# Patient Record
Sex: Male | Born: 1960 | Race: Black or African American | Hispanic: No | Marital: Married
Health system: Southern US, Community
[De-identification: ages and names within clinical notes are randomized; demographics above are authoritative.]

## PROBLEM LIST (undated history)

## (undated) DIAGNOSIS — E119 Type 2 diabetes mellitus without complications: Secondary | ICD-10-CM

## (undated) DIAGNOSIS — I219 Acute myocardial infarction, unspecified: Secondary | ICD-10-CM

## (undated) DIAGNOSIS — J449 Chronic obstructive pulmonary disease, unspecified: Secondary | ICD-10-CM

## (undated) DIAGNOSIS — G4730 Sleep apnea, unspecified: Secondary | ICD-10-CM

## (undated) DIAGNOSIS — R7303 Prediabetes: Secondary | ICD-10-CM

## (undated) DIAGNOSIS — J45909 Unspecified asthma, uncomplicated: Secondary | ICD-10-CM

## (undated) HISTORY — DX: Type 2 diabetes mellitus without complications: E11.9

## (undated) HISTORY — PX: OTHER SURGICAL HISTORY: SHX169

---

## 2016-09-25 ENCOUNTER — Emergency Department (HOSPITAL_COMMUNITY)
Admission: EM | Admit: 2016-09-25 | Discharge: 2016-09-25 | Discharge status: Against Medical Advice | Payer: Medicaid Other | Attending: Emergency Medicine | Admitting: Emergency Medicine

## 2016-09-25 ENCOUNTER — Encounter (HOSPITAL_COMMUNITY): Payer: Self-pay | Admitting: *Deleted

## 2016-09-25 ENCOUNTER — Emergency Department (HOSPITAL_COMMUNITY): Payer: Medicaid Other

## 2016-09-25 DIAGNOSIS — Z5320 Procedure and treatment not carried out because of patient's decision for unspecified reasons: Secondary | ICD-10-CM | POA: Diagnosis not present

## 2016-09-25 DIAGNOSIS — R0602 Shortness of breath: Secondary | ICD-10-CM | POA: Diagnosis present

## 2016-09-25 DIAGNOSIS — J4541 Moderate persistent asthma with (acute) exacerbation: Secondary | ICD-10-CM | POA: Diagnosis not present

## 2016-09-25 DIAGNOSIS — J449 Chronic obstructive pulmonary disease, unspecified: Secondary | ICD-10-CM | POA: Diagnosis not present

## 2016-09-25 HISTORY — DX: Unspecified asthma, uncomplicated: J45.909

## 2016-09-25 MED ORDER — IPRATROPIUM BROMIDE 0.02 % IN SOLN
0.5000 mg | Freq: Once | RESPIRATORY_TRACT | Status: AC
  Administered 2016-09-25: 0.5 mg via RESPIRATORY_TRACT
  Filled 2016-09-25: qty 2.5

## 2016-09-25 MED ORDER — ALBUTEROL SULFATE (2.5 MG/3ML) 0.083% IN NEBU
5.0000 mg | INHALATION_SOLUTION | Freq: Once | RESPIRATORY_TRACT | Status: AC
  Administered 2016-09-25: 5 mg via RESPIRATORY_TRACT
  Filled 2016-09-25: qty 6

## 2016-09-25 MED ORDER — PREDNISONE 20 MG PO TABS
60.0000 mg | ORAL_TABLET | Freq: Once | ORAL | Status: AC
  Administered 2016-09-25: 60 mg via ORAL
  Filled 2016-09-25: qty 3

## 2016-09-25 MED ORDER — ALBUTEROL SULFATE (2.5 MG/3ML) 0.083% IN NEBU
INHALATION_SOLUTION | RESPIRATORY_TRACT | Status: AC
  Filled 2016-09-25: qty 6

## 2016-09-25 MED ORDER — ALBUTEROL SULFATE (2.5 MG/3ML) 0.083% IN NEBU
5.0000 mg | INHALATION_SOLUTION | Freq: Once | RESPIRATORY_TRACT | Status: AC
  Administered 2016-09-25: 5 mg via RESPIRATORY_TRACT

## 2016-09-25 NOTE — ED Provider Notes (Signed)
[facility] DEPT Provider Note   CSN: [id number] Arrival date & time: 09/25/16  7846     History   Chief Complaint Chief Complaint  Patient presents with  . Asthma    HPI Jack Cunningham is a 56 y.o. male.  Jack Cunningham is a 56 y.o. Male with a history of asthma and COPD who presents to the emergency department complaining of worsening shortness of breath and wheezing over the past 2 days. Patient reports he ran out of his albuterol inhaler and Symbicort inhaler about 2 days ago. He reports over the past 2 days he's had worsening wheezing, chest tightness and shortness of breath. He reports he feels like his asthma is exacerbated. He is not a smoker. He reports he works in a [employer] which exacerbates his symptoms. He denies fevers, chest pain, hemoptysis, leg pain, leg swelling, abdominal pain, nausea, vomiting, lightheadedness, dizziness or rashes.   The history is provided by the patient and medical records. No language interpreter was used.  Asthma  Associated symptoms include shortness of breath. Pertinent negatives include no chest pain, no abdominal pain and no headaches.    Past Medical History:  Diagnosis Date  . Asthma     There are no active problems to display for this patient.   History reviewed. No pertinent surgical history.     Home Medications    Prior to Admission medications   Medication Sig Start Date End Date Taking? Authorizing Provider  albuterol (PROVENTIL HFA;VENTOLIN HFA) 108 (90 Base) MCG/ACT inhaler Inhale 2 puffs into the lungs every 6 (six) hours as needed for wheezing or shortness of breath.   Yes [provider]  budesonide-formoterol (SYMBICORT) 160-4.5 MCG/ACT inhaler Inhale 2 puffs into the lungs 2 (two) times daily.   Yes [provider]    Family History No family history on file.  Social History Social History  Substance Use Topics  . Smoking status: Never Smoker  . Smokeless tobacco: Never Used    . Alcohol use No     Allergies   Patient has no known allergies.   Review of Systems Review of Systems  Constitutional: Negative for chills and fever.  HENT: Negative for congestion and sore throat.   Eyes: Negative for visual disturbance.  Respiratory: Positive for cough, chest tightness, shortness of breath and wheezing.   Cardiovascular: Negative for chest pain and palpitations.  Gastrointestinal: Negative for abdominal pain, nausea and vomiting.  Genitourinary: Negative for dysuria.  Musculoskeletal: Negative for back pain.  Skin: Negative for rash.  Neurological: Negative for syncope, light-headedness and headaches.     Physical Exam Updated Vital Signs BP 123/83   Pulse 65   Temp 97.6 F (36.4 C) (Oral)   Resp 18   SpO2 97%   Physical Exam  Constitutional: He appears well-developed and well-nourished. No distress.  Nontoxic appearing.  HENT:  Head: Normocephalic and atraumatic.  Mouth/Throat: Oropharynx is clear and moist.  Eyes: Pupils are equal, round, and reactive to light. Conjunctivae are normal. Right eye exhibits no discharge. Left eye exhibits no discharge.  Neck: Neck supple. No JVD present.  Cardiovascular: Normal rate, regular rhythm, normal heart sounds and intact distal pulses.  Exam reveals no gallop and no friction rub.   No murmur heard. Pulmonary/Chest: Effort normal. No respiratory distress. He has wheezes. He has no rales.  Diminished lung sounds bilaterally with expiratory wheezes noted bilaterally. No increased work of breathing. Speaking in complete sentences. No rales or rhonchi.  Abdominal: Soft. There is  no tenderness.  Musculoskeletal: He exhibits no edema or tenderness.  Lymphadenopathy:    He has no cervical adenopathy.  Neurological: He is alert. Coordination normal.  Skin: Skin is warm and dry. No rash noted. He is not diaphoretic. No erythema. No pallor.  Psychiatric: He has a normal mood and affect. His behavior is normal.   Nursing note and vitals reviewed.    ED Treatments / Results  Labs (all labs ordered are listed, but only abnormal results are displayed) Labs Reviewed - No data to display  EKG  EKG Interpretation None       Radiology Dg Chest 2 View  Result Date: 09/25/2016 CLINICAL DATA:  Cough. EXAM: CHEST  2 VIEW COMPARISON:  No prior . FINDINGS: Mediastinum and hilar structures are normal. Lungs are clear of infiltrates. Solitary nodular opacities noted over the lung bases on PA view only consistent with nipple shadows. No pleural effusion or pneumothorax. Degenerative changes with scoliosis thoracic spine . IMPRESSION: 1.  No active cardiopulmonary disease. 2. Solitary nodular opacities noted over both lung bases on PA view only consistent with nipple shadows. Repeat PA chest x-ray with nipple markers can be obtained for confirmation . Electronically Signed   By: Marcello Moores  Register   On: 09/25/2016 08:30    Procedures Procedures (including critical care time)  Medications Ordered in ED Medications  albuterol (PROVENTIL) (2.5 MG/3ML) 0.083% nebulizer solution (not administered)  albuterol (PROVENTIL) (2.5 MG/3ML) 0.083% nebulizer solution 5 mg (5 mg Nebulization Given 09/25/16 0759)  albuterol (PROVENTIL) (2.5 MG/3ML) 0.083% nebulizer solution 5 mg (5 mg Nebulization Given 09/25/16 1114)  ipratropium (ATROVENT) nebulizer solution 0.5 mg (0.5 mg Nebulization Given 09/25/16 1114)  predniSONE (DELTASONE) tablet 60 mg (60 mg Oral Given 09/25/16 1113)     Initial Impression / Assessment and Plan / ED Course  I have reviewed the triage vital signs and the nursing notes.  Pertinent labs & imaging results that were available during my care of the patient were reviewed by me and considered in my medical decision making (see chart for details).    This  is a 56 y.o. Male with a history of asthma and COPD who presents to the emergency department complaining of worsening shortness of breath and  wheezing over the past 2 days. Patient reports he ran out of his albuterol inhaler and Symbicort inhaler about 2 days ago. He reports over the past 2 days he's had worsening wheezing, chest tightness and shortness of breath. He reports he feels like his asthma is exacerbated. He is not a smoker. He reports he works in a Chi[employer] which exacerbates his symptoms. On exam the patient is afebrile and non-toxic appearing. He has diminished lung sounds bilateral bases with expiratory wheezing noted bilaterally. He reports feeling somewhat better after initial breathing treatment in triage. Chest x-ray was obtained in triage which has no active cardiopulmonary disease. It does show possible nipple shadows. Recommend repeat x-ray to confirm at some point. I discussed the plan of the patient. Plan for additional breathing treatment and prednisone. Will reevaluate after breathing treatment. I went back to reevaluate the patient and the patient was on the room. I suspect the patient may have been in the bathroom. Will recheck again shortly. I went back to reevaluate the patient again and he is non-in the room. Patient's gown is on the bed. He is not able to be found. Nurse reports that he may have left and she last had vitals on him about an hour ago.  I called the patient and he did not answer the phone. Voicemail box was not set up. Patient left prior to discharge or reevaluation.    Final Clinical Impressions(s) / ED Diagnoses   Final diagnoses:  Moderate persistent asthma with exacerbation    New Prescriptions New Prescriptions   No medications on file     Waynetta Pean, [facility]hal [facility] 09/25/16 1337    Pattricia Boss, MD 09/25/16 [phone]

## 2016-09-25 NOTE — ED Triage Notes (Signed)
Pt reports running out of his inhaler yesterday, c/o SOB, denies pain, pt speaks in full sentences, NAD, A&O x4

## 2016-09-25 NOTE — ED Notes (Signed)
Pt not in room, gown on bed.   

## 2017-06-14 ENCOUNTER — Emergency Department (HOSPITAL_COMMUNITY)
Admission: EM | Admit: 2017-06-14 | Discharge: 2017-06-14 | Disposition: A | Discharge status: Home / Self Care | Payer: Medicaid Other | Attending: Emergency Medicine | Admitting: Emergency Medicine

## 2017-06-14 ENCOUNTER — Other Ambulatory Visit: Payer: Self-pay

## 2017-06-14 ENCOUNTER — Emergency Department (HOSPITAL_COMMUNITY): Payer: Medicaid Other

## 2017-06-14 ENCOUNTER — Encounter (HOSPITAL_COMMUNITY): Payer: Self-pay | Admitting: Nurse Practitioner

## 2017-06-14 DIAGNOSIS — J441 Chronic obstructive pulmonary disease with (acute) exacerbation: Secondary | ICD-10-CM | POA: Diagnosis not present

## 2017-06-14 DIAGNOSIS — R0602 Shortness of breath: Secondary | ICD-10-CM | POA: Diagnosis present

## 2017-06-14 DIAGNOSIS — J45909 Unspecified asthma, uncomplicated: Secondary | ICD-10-CM | POA: Insufficient documentation

## 2017-06-14 HISTORY — DX: Chronic obstructive pulmonary disease, unspecified: J44.9

## 2017-06-14 LAB — BASIC METABOLIC PANEL
Anion gap: 9 (ref 5–15)
BUN: 16 mg/dL (ref 6–20)
CALCIUM: 9.5 mg/dL (ref 8.9–10.3)
CHLORIDE: 107 mmol/L (ref 101–111)
CO2: 27 mmol/L (ref 22–32)
CREATININE: 1.04 mg/dL (ref 0.61–1.24)
Glucose, Bld: 117 mg/dL — ABNORMAL HIGH (ref 65–99)
Potassium: 4.1 mmol/L (ref 3.5–5.1)
SODIUM: 143 mmol/L (ref 135–145)

## 2017-06-14 LAB — CBC
HCT: 43.1 % (ref 39.0–52.0)
Hemoglobin: 13.9 g/dL (ref 13.0–17.0)
MCH: 29.2 pg (ref 26.0–34.0)
MCHC: 32.3 g/dL (ref 30.0–36.0)
MCV: 90.5 fL (ref 78.0–100.0)
PLATELETS: 257 10*3/uL (ref 150–400)
RBC: 4.76 MIL/uL (ref 4.22–5.81)
RDW: 12.9 % (ref 11.5–15.5)
WBC: 9.1 10*3/uL (ref 4.0–10.5)

## 2017-06-14 LAB — I-STAT TROPONIN, ED: TROPONIN I, POC: 0 ng/mL (ref 0.00–0.08)

## 2017-06-14 MED ORDER — IPRATROPIUM-ALBUTEROL 0.5-2.5 (3) MG/3ML IN SOLN
3.0000 mL | Freq: Once | RESPIRATORY_TRACT | Status: AC
  Administered 2017-06-14: 3 mL via RESPIRATORY_TRACT
  Filled 2017-06-14: qty 3

## 2017-06-14 MED ORDER — PREDNISONE 50 MG PO TABS: 50.0000 mg | ORAL_TABLET | Freq: Every day | ORAL | 0 refills | Status: DC

## 2017-06-14 MED ORDER — AZITHROMYCIN 250 MG PO TABS: 250.0000 mg | ORAL_TABLET | Freq: Every day | ORAL | 0 refills | Status: DC

## 2017-06-14 MED ORDER — BUDESONIDE-FORMOTEROL FUMARATE 160-4.5 MCG/ACT IN AERO: 2.0000 | INHALATION_SPRAY | Freq: Two times a day (BID) | RESPIRATORY_TRACT | 0 refills | Status: DC

## 2017-06-14 MED ORDER — ALBUTEROL SULFATE (2.5 MG/3ML) 0.083% IN NEBU: 2.5000 mg | INHALATION_SOLUTION | RESPIRATORY_TRACT | 0 refills | Status: DC | PRN

## 2017-06-14 MED ORDER — ALBUTEROL SULFATE HFA 108 (90 BASE) MCG/ACT IN AERS
2.0000 | INHALATION_SPRAY | Freq: Once | RESPIRATORY_TRACT | Status: AC
  Administered 2017-06-14: 2 via RESPIRATORY_TRACT
  Filled 2017-06-14: qty 6.7

## 2017-06-14 NOTE — ED Notes (Signed)
PATIENT O2 STATS REMAINED IN THE MID 90'S DURING AMBULATION AROUND THE ED

## 2017-06-14 NOTE — Discharge Instructions (Addendum)
Please take steroids once daily for the next 5 days.  Complete antibiotic course as directed.  Use your inhaler or nebulizers as needed.  Please use the phone number provided on your paperwork to obtain a primary care provider for follow-up.  Return to the emergency department for worsening shortness of breath, chest pain, fevers or chills, worsening cough or swelling in her lower extremities.

## 2017-06-14 NOTE — ED Triage Notes (Signed)
Patient brought in my EMS for SOB for one week. Patient has a history of COPD. Patient has gone through his whole box of albuterol in one week so he decided to get treatment.

## 2017-06-14 NOTE — ED Provider Notes (Signed)
[facility] DEPT Provider Note   CSN: [id number] Arrival date & time: 06/14/17  1102     History   Chief Complaint Chief Complaint  Patient presents with  . Shortness of Breath    HPI Jack Cunningham is a 57 y.o. male.  Jack Cunningham is a 57 y.o. Male with history of asthma COPD, presents to the ED for evaluation of 1 week of worsening shortness of breath.  Patient reports he has been using his Symbicort and albuterol nebulizers over the past week and reports minimal improvement immediately after treatment, but symptoms continue to return and if not resolved.  Patient reports he went through his entire box of nebulizer solution over the past week.  Patient reports cough productive of increasing amounts of sputum.  He denies fevers or chills.  He reports some occasional chest pains, primarily with coughing and with the shortness of breath gets very bad, no pleuritic chest pain.  Pain is nonradiating.  Patient denies dizziness, lightheadedness or syncope.  No abdominal pain, nausea, vomiting or diarrhea.  No recent surgeries or long distance travel, no lower extremity swelling, no history of DVT or PE.  Patient received DuoNeb as well as 125 mg of IV Solu-Medrol with EMS, and reports he is starting to feel better.     Past Medical History:  Diagnosis Date  . Asthma     There are no active problems to display for this patient.   No past surgical history on file.      Home Medications    Prior to Admission medications   Medication Sig Start Date End Date Taking? Authorizing Provider  albuterol (PROVENTIL HFA;VENTOLIN HFA) 108 (90 Base) MCG/ACT inhaler Inhale 2 puffs into the lungs every 6 (six) hours as needed for wheezing or shortness of breath.    [provider]  budesonide-formoterol (SYMBICORT) 160-4.5 MCG/ACT inhaler Inhale 2 puffs into the lungs 2 (two) times daily.    [provider]    Family History No family  history on file.  Social History Social History   Tobacco Use  . Smoking status: Never Smoker  . Smokeless tobacco: Never Used  Substance Use Topics  . Alcohol use: No  . Drug use: No     Allergies   Patient has no known allergies.   Review of Systems Review of Systems  Constitutional: Negative for chills and fever.  HENT: Negative for congestion, rhinorrhea and sore throat.   Eyes: Negative for visual disturbance.  Respiratory: Positive for cough, shortness of breath and wheezing. Negative for chest tightness.   Cardiovascular: Positive for chest pain. Negative for palpitations and leg swelling.  Gastrointestinal: Negative for abdominal pain, diarrhea, nausea and vomiting.  Genitourinary: Negative for dysuria and frequency.  Musculoskeletal: Negative for arthralgias, back pain and joint swelling.  Skin: Negative for color change and rash.  Neurological: Negative for dizziness, syncope, weakness, light-headedness and headaches.     Physical Exam Updated Vital Signs There were no vitals taken for this visit.  Physical Exam  Constitutional: He is oriented to person, place, and time. He appears well-developed and well-nourished. No distress.  HENT:  Head: Normocephalic and atraumatic.  Mouth/Throat: Oropharynx is clear and moist.  Eyes: Right eye exhibits no discharge. Left eye exhibits no discharge.  Neck: Neck supple.  Cardiovascular: Normal rate, regular rhythm and normal heart sounds.  Pulmonary/Chest: Effort normal. No stridor. No respiratory distress. He has wheezes. He has no rales.  Respirations equal and unlabored, patient able to  speak in full sentences, diffuse end expiratory wheeze bilaterally, intermittent coughing on exam  Abdominal: Soft. Bowel sounds are normal. He exhibits no distension and no mass. There is no tenderness. There is no guarding.  Musculoskeletal: He exhibits no edema or deformity.  Bilateral lower extremities without edema or tenderness    Neurological: He is alert and oriented to person, place, and time. Coordination normal.  Skin: Skin is warm and dry. Capillary refill takes less than 2 seconds. He is not diaphoretic.  Psychiatric: He has a normal mood and affect. His behavior is normal.  Nursing note and vitals reviewed.    ED Treatments / Results  Labs (all labs ordered are listed, but only abnormal results are displayed) Labs Reviewed  BASIC METABOLIC PANEL - Abnormal; Notable for the following components:      Result Value   Glucose, Bld 117 (*)    All other components within normal limits  CBC  I-STAT TROPONIN, ED    EKG EKG Interpretation  Date/Time:  Monday June 14 2017 11:13:56 EDT Ventricular Rate:  70 PR Interval:    QRS Duration: 90 QT Interval:  386 QTC Calculation: 417 R Axis:   69 Text Interpretation:  Sinus rhythm NO STEMI No old tracing to compare Confirmed by Addison Lank [id number]) on 06/14/2017 11:16:48 AM   Radiology Dg Chest 2 View  Result Date: 06/14/2017 CLINICAL DATA:  Shortness of Breath EXAM: CHEST - 2 VIEW COMPARISON:  09/25/2016 FINDINGS: Heart and mediastinal contours are within normal limits. No focal opacities or effusions. No acute bony abnormality. IMPRESSION: No active cardiopulmonary disease. Electronically Signed   By: Rolm Baptise M.D.   On: 06/14/2017 11:41    Procedures Procedures (including critical care time)  Medications Ordered in ED Medications  ipratropium-albuterol (DUONEB) 0.5-2.5 (3) MG/3ML nebulizer solution 3 mL (3 mLs Nebulization Given 06/14/17 1322)     Initial Impression / Assessment and Plan / ED Course  I have reviewed the triage vital signs and the nursing notes.  Pertinent labs & imaging results that were available during my care of the patient were reviewed by me and considered in my medical decision making (see chart for details).  Patient presents to the ED for evaluation of worsening shortness of breath over the past week with coughing  and wheezing.  Mild intermittent chest pain, primarily associated with exacerbations of shortness of breath.  Patient with normal vitals and in no acute distress.  No increased work of breathing, but diffuse end expiratory wheeze bilaterally.  Exam otherwise unremarkable.  No lower extremity edema, recent surgery or travel or history of DVT, so I have very low suspicion of PE especially given obvious wheeze on exam.  Unlikely ACS.  We will get basic labs, troponin, EKG and chest x-ray.  Patient received 125 of Solu-Medrol and DuoNeb with EMS, will give additional DuoNeb and reevaluate.  Labs overall reassuring, no leukocytosis, normal hemoglobin, no electrolyte derangements requiring intervention, normal renal function, negative troponin, EKG without concerning ischemic changes.  Chest x-ray shows no active cardiopulmonary disease.  2:15 PM patient ambulated in the department maintaining normal O2 sats with no increased work of breathing, reports he is feeling much better.  Lungs sound much better on exam.  At this time I feel he is stable for discharge home, will refill albuterol nebulizer solution, and provide short course of steroids, and antibiotic given COPD with increased sputum production.   Final Clinical Impressions(s) / ED Diagnoses   Final diagnoses:  COPD exacerbation ([place])  ED Discharge Orders        Ordered    azithromycin (ZITHROMAX) 250 MG tablet  Daily     06/14/17 1436    predniSONE (DELTASONE) 50 MG tablet  Daily     06/14/17 1436    albuterol (PROVENTIL) (2.5 MG/3ML) 0.083% nebulizer solution  Every 4 hours PRN     06/14/17 1436    budesonide-formoterol (SYMBICORT) 160-4.5 MCG/ACT inhaler  2 times daily     06/14/17 1436       Jacqlyn Larsen, [place] 06/14/17 1440    Fatima Blank, MD 06/14/17 2040

## 2017-06-14 NOTE — ED Notes (Signed)
Bed: WA18 Expected date:  Expected time:  Means of arrival:  Comments: EMS-SOB 

## 2017-06-23 ENCOUNTER — Other Ambulatory Visit: Payer: Self-pay

## 2017-06-23 ENCOUNTER — Emergency Department (HOSPITAL_COMMUNITY): Payer: Medicaid Other

## 2017-06-23 ENCOUNTER — Encounter (HOSPITAL_COMMUNITY): Payer: Self-pay | Admitting: Emergency Medicine

## 2017-06-23 ENCOUNTER — Inpatient Hospital Stay (HOSPITAL_COMMUNITY)
Admission: EM | Admit: 2017-06-23 | Discharge: 2017-06-25 | DRG: 191 | Disposition: A | Discharge status: Home / Self Care | Payer: Medicaid Other | Attending: Internal Medicine | Admitting: Internal Medicine

## 2017-06-23 DIAGNOSIS — Z7951 Long term (current) use of inhaled steroids: Secondary | ICD-10-CM

## 2017-06-23 DIAGNOSIS — Z79899 Other long term (current) drug therapy: Secondary | ICD-10-CM

## 2017-06-23 DIAGNOSIS — R0602 Shortness of breath: Secondary | ICD-10-CM

## 2017-06-23 DIAGNOSIS — J441 Chronic obstructive pulmonary disease with (acute) exacerbation: Principal | ICD-10-CM | POA: Diagnosis present

## 2017-06-23 DIAGNOSIS — J45901 Unspecified asthma with (acute) exacerbation: Secondary | ICD-10-CM | POA: Diagnosis present

## 2017-06-23 DIAGNOSIS — Z7982 Long term (current) use of aspirin: Secondary | ICD-10-CM

## 2017-06-23 DIAGNOSIS — J449 Chronic obstructive pulmonary disease, unspecified: Secondary | ICD-10-CM | POA: Diagnosis present

## 2017-06-23 DIAGNOSIS — R739 Hyperglycemia, unspecified: Secondary | ICD-10-CM | POA: Diagnosis present

## 2017-06-23 LAB — CBC
HEMATOCRIT: 43.4 % (ref 39.0–52.0)
HEMOGLOBIN: 14.1 g/dL (ref 13.0–17.0)
MCH: 29.4 pg (ref 26.0–34.0)
MCHC: 32.5 g/dL (ref 30.0–36.0)
MCV: 90.6 fL (ref 78.0–100.0)
Platelets: 195 10*3/uL (ref 150–400)
RBC: 4.79 MIL/uL (ref 4.22–5.81)
RDW: 13.3 % (ref 11.5–15.5)
WBC: 8 10*3/uL (ref 4.0–10.5)

## 2017-06-23 LAB — CBC WITH DIFFERENTIAL/PLATELET
BASOS ABS: 0 10*3/uL (ref 0.0–0.1)
BASOS PCT: 0 %
EOS ABS: 0.9 10*3/uL — AB (ref 0.0–0.7)
Eosinophils Relative: 10 %
HCT: 44.4 % (ref 39.0–52.0)
HEMOGLOBIN: 14.5 g/dL (ref 13.0–17.0)
Lymphocytes Relative: 13 %
Lymphs Abs: 1.1 10*3/uL (ref 0.7–4.0)
MCH: 29.6 pg (ref 26.0–34.0)
MCHC: 32.7 g/dL (ref 30.0–36.0)
MCV: 90.6 fL (ref 78.0–100.0)
MONOS PCT: 10 %
Monocytes Absolute: 0.9 10*3/uL (ref 0.1–1.0)
NEUTROS PCT: 67 %
Neutro Abs: 5.9 10*3/uL (ref 1.7–7.7)
Platelets: 204 10*3/uL (ref 150–400)
RBC: 4.9 MIL/uL (ref 4.22–5.81)
RDW: 13.3 % (ref 11.5–15.5)
WBC: 8.9 10*3/uL (ref 4.0–10.5)

## 2017-06-23 LAB — BASIC METABOLIC PANEL
Anion gap: 11 (ref 5–15)
BUN: 15 mg/dL (ref 6–20)
CALCIUM: 8.7 mg/dL — AB (ref 8.9–10.3)
CO2: 25 mmol/L (ref 22–32)
Chloride: 107 mmol/L (ref 101–111)
Creatinine, Ser: 0.92 mg/dL (ref 0.61–1.24)
Glucose, Bld: 107 mg/dL — ABNORMAL HIGH (ref 65–99)
Potassium: 3.9 mmol/L (ref 3.5–5.1)
Sodium: 143 mmol/L (ref 135–145)

## 2017-06-23 LAB — I-STAT TROPONIN, ED: TROPONIN I, POC: 0 ng/mL (ref 0.00–0.08)

## 2017-06-23 LAB — CREATININE, SERUM
Creatinine, Ser: 1.13 mg/dL (ref 0.61–1.24)
GFR calc non Af Amer: >60 mL/min (ref >60)

## 2017-06-23 MED ORDER — ALBUTEROL SULFATE (2.5 MG/3ML) 0.083% IN NEBU: 2.5000 mg | INHALATION_SOLUTION | RESPIRATORY_TRACT | Status: DC | PRN

## 2017-06-23 MED ORDER — MOMETASONE FURO-FORMOTEROL FUM 200-5 MCG/ACT IN AERO
2.0000 | INHALATION_SPRAY | Freq: Two times a day (BID) | RESPIRATORY_TRACT | Status: DC
  Administered 2017-06-24 – 2017-06-25 (×3): 2 via RESPIRATORY_TRACT
  Filled 2017-06-23: qty 8.8

## 2017-06-23 MED ORDER — ADULT MULTIVITAMIN W/MINERALS CH
1.0000 | ORAL_TABLET | Freq: Every day | ORAL | Status: DC
  Administered 2017-06-23 – 2017-06-25 (×3): 1 via ORAL
  Filled 2017-06-23 (×3): qty 1

## 2017-06-23 MED ORDER — IPRATROPIUM BROMIDE 0.02 % IN SOLN
0.5000 mg | Freq: Once | RESPIRATORY_TRACT | Status: AC
  Administered 2017-06-23: 0.5 mg via RESPIRATORY_TRACT
  Filled 2017-06-23: qty 2.5

## 2017-06-23 MED ORDER — TIOTROPIUM BROMIDE MONOHYDRATE 18 MCG IN CAPS
18.0000 ug | ORAL_CAPSULE | Freq: Every day | RESPIRATORY_TRACT | Status: DC
  Administered 2017-06-24: 18 ug via RESPIRATORY_TRACT
  Filled 2017-06-23: qty 5

## 2017-06-23 MED ORDER — SODIUM CHLORIDE 0.9% FLUSH: 3.0000 mL | INTRAVENOUS | Status: DC | PRN

## 2017-06-23 MED ORDER — ALBUTEROL SULFATE (2.5 MG/3ML) 0.083% IN NEBU
2.5000 mg | INHALATION_SOLUTION | Freq: Three times a day (TID) | RESPIRATORY_TRACT | Status: DC
  Administered 2017-06-24: 2.5 mg via RESPIRATORY_TRACT
  Filled 2017-06-23: qty 3

## 2017-06-23 MED ORDER — ALBUTEROL SULFATE (2.5 MG/3ML) 0.083% IN NEBU
5.0000 mg | INHALATION_SOLUTION | Freq: Once | RESPIRATORY_TRACT | Status: AC
  Administered 2017-06-23: 5 mg via RESPIRATORY_TRACT
  Filled 2017-06-23: qty 6

## 2017-06-23 MED ORDER — ALBUTEROL SULFATE (2.5 MG/3ML) 0.083% IN NEBU
2.5000 mg | INHALATION_SOLUTION | Freq: Four times a day (QID) | RESPIRATORY_TRACT | Status: DC
  Administered 2017-06-23: 2.5 mg via RESPIRATORY_TRACT
  Filled 2017-06-23: qty 3

## 2017-06-23 MED ORDER — ALBUTEROL (5 MG/ML) CONTINUOUS INHALATION SOLN
10.0000 mg/h | INHALATION_SOLUTION | RESPIRATORY_TRACT | Status: DC
  Administered 2017-06-23: 10 mg/h via RESPIRATORY_TRACT
  Filled 2017-06-23: qty 20

## 2017-06-23 MED ORDER — METHYLPREDNISOLONE SODIUM SUCC 40 MG IJ SOLR
40.0000 mg | Freq: Three times a day (TID) | INTRAMUSCULAR | Status: DC
  Administered 2017-06-23 – 2017-06-25 (×6): 40 mg via INTRAVENOUS
  Filled 2017-06-23 (×6): qty 1

## 2017-06-23 MED ORDER — IPRATROPIUM-ALBUTEROL 0.5-2.5 (3) MG/3ML IN SOLN: 3.0000 mL | Freq: Once | RESPIRATORY_TRACT | Status: DC

## 2017-06-23 MED ORDER — IPRATROPIUM-ALBUTEROL 0.5-2.5 (3) MG/3ML IN SOLN
3.0000 mL | Freq: Once | RESPIRATORY_TRACT | Status: AC
  Administered 2017-06-23: 3 mL via RESPIRATORY_TRACT
  Filled 2017-06-23: qty 3

## 2017-06-23 MED ORDER — HEPARIN SODIUM (PORCINE) 5000 UNIT/ML IJ SOLN
5000.0000 [IU] | Freq: Three times a day (TID) | INTRAMUSCULAR | Status: DC
  Administered 2017-06-23 – 2017-06-25 (×5): 5000 [IU] via SUBCUTANEOUS
  Filled 2017-06-23 (×5): qty 1

## 2017-06-23 MED ORDER — METHYLPREDNISOLONE SODIUM SUCC 125 MG IJ SOLR
125.0000 mg | Freq: Once | INTRAMUSCULAR | Status: AC
  Administered 2017-06-23: 125 mg via INTRAVENOUS
  Filled 2017-06-23: qty 2

## 2017-06-23 MED ORDER — SODIUM CHLORIDE 0.9 % IV SOLN: 250.0000 mL | INTRAVENOUS | Status: DC | PRN

## 2017-06-23 MED ORDER — ASPIRIN 325 MG PO TABS
325.0000 mg | ORAL_TABLET | Freq: Every day | ORAL | Status: DC
  Administered 2017-06-23 – 2017-06-25 (×3): 325 mg via ORAL
  Filled 2017-06-23 (×3): qty 1

## 2017-06-23 MED ORDER — ALBUTEROL SULFATE HFA 108 (90 BASE) MCG/ACT IN AERS
1.0000 | INHALATION_SPRAY | Freq: Four times a day (QID) | RESPIRATORY_TRACT | Status: DC
  Filled 2017-06-23: qty 6.7

## 2017-06-23 MED ORDER — ALBUTEROL SULFATE (2.5 MG/3ML) 0.083% IN NEBU
2.5000 mg | INHALATION_SOLUTION | RESPIRATORY_TRACT | Status: DC | PRN
  Administered 2017-06-24: 2.5 mg via RESPIRATORY_TRACT
  Filled 2017-06-23: qty 3

## 2017-06-23 MED ORDER — SODIUM CHLORIDE 0.9% FLUSH
3.0000 mL | Freq: Two times a day (BID) | INTRAVENOUS | Status: DC
  Administered 2017-06-23 – 2017-06-25 (×4): 3 mL via INTRAVENOUS

## 2017-06-23 NOTE — ED Notes (Signed)
Urine sample has been obtained at this time. No urine order so pt's urine is placed in room awaiting orders.

## 2017-06-23 NOTE — ED Notes (Signed)
[facility]PA Provider at bedside. 

## 2017-06-23 NOTE — ED Provider Notes (Addendum)
[facility] DEPT Provider Note   CSN: [id number] Arrival date & time: 06/23/17  0957     History   Chief Complaint Chief Complaint  Patient presents with  . Asthma    HPI Leandro Berkowitz is a 57 y.o. male with a history of asthma, COPD who presents the emergency department today for shortness of breath times 2.5 weeks.  Patient reports the last 2.5 weeks he has been having intermittent episodes of shortness of breath that commonly occur at rest and also with exertion.  He notes some occasional chest tightness associated with this.  He reports he has been using his Symbicort and albuterol nebulizers with minimal improvement.  He was seen here on 4/15 with a reassuring workup and discharged home on a steroid burst.  He took this to completion.  He notes very mild improvement of his symptoms but days later his shortness of breath returned.  The patient denies any fever, chills, worsening cough, lightheadedness/syncope, lower extremity swelling, recent surgeries, hemoptysis, immobilization, or history of DVT or PE.  Denies history of intubations for COPD/asthma.  He does report that he has had hospitalizations for this in the past.  No recent hospitalizations the last 6 months.  Patient is not on home O2.  HPI  Past Medical History:  Diagnosis Date  . Asthma   . COPD (chronic obstructive pulmonary disease) (HCC)     There are no active problems to display for this patient.   History reviewed. No pertinent surgical history.      Home Medications    Prior to Admission medications   Medication Sig Start Date End Date Taking? Authorizing Provider  albuterol (PROVENTIL HFA;VENTOLIN HFA) 108 (90 Base) MCG/ACT inhaler Inhale 2 puffs into the lungs every 6 (six) hours as needed for wheezing or shortness of breath.    [provider]  albuterol (PROVENTIL) (2.5 MG/3ML) 0.083% nebulizer solution Take 3 mLs (2.5 mg total) by nebulization every 4 (four)  hours as needed for wheezing or shortness of breath. 06/14/17   Jacqlyn Larsen, PA-C  azithromycin (ZITHROMAX) 250 MG tablet Take 1 tablet (250 mg total) by mouth daily. Take first 2 tablets together, then 1 every day until finished. 06/14/17   Jacqlyn Larsen, PA-C  budesonide-formoterol (SYMBICORT) 160-4.5 MCG/ACT inhaler Inhale 2 puffs into the lungs 2 (two) times daily. 06/14/17   Jacqlyn Larsen, PA-C  predniSONE (DELTASONE) 50 MG tablet Take 1 tablet (50 mg total) by mouth daily. 06/14/17   Jacqlyn Larsen, PA-C    Family History No family history on file.  Social History Social History   Tobacco Use  . Smoking status: Never Smoker  . Smokeless tobacco: Never Used  Substance Use Topics  . Alcohol use: No  . Drug use: No     Allergies   Patient has no known allergies.   Review of Systems Review of Systems  All other systems reviewed and are negative.    Physical Exam Updated Vital Signs BP (!) 151/80 (BP Location: Right Arm)   Pulse 92   Temp 98.4 F (36.9 C) (Oral)   Resp 17   Ht 5\' 11"  (1.803 m)   Wt 97.5 kg (215 lb)   SpO2 95%   BMI 29.99 kg/m   Physical Exam  Constitutional: He appears well-developed and well-nourished.  HENT:  Head: Normocephalic and atraumatic.  Right Ear: Tympanic membrane and external ear normal.  Left Ear: Tympanic membrane and external ear normal.  Nose: Nose normal.  Mouth/Throat: Uvula is midline, oropharynx is clear and moist and mucous membranes are normal. No tonsillar exudate.  Eyes: Pupils are equal, round, and reactive to light. Right eye exhibits no discharge. Left eye exhibits no discharge. No scleral icterus.  Neck: Trachea normal. Neck supple. No spinous process tenderness present. No neck rigidity. Normal range of motion present.  Cardiovascular: Normal rate, regular rhythm and intact distal pulses.  No murmur heard. Pulses:      Radial pulses are 2+ on the right side, and 2+ on the left side.       Dorsalis pedis pulses  are 2+ on the right side, and 2+ on the left side.       Posterior tibial pulses are 2+ on the right side, and 2+ on the left side.  No lower extremity swelling or edema. Calves symmetric in size bilaterally.  Pulmonary/Chest: Effort normal. No accessory muscle usage. Tachypnea noted. No respiratory distress. He has wheezes (global, inspiratory and expiratory). He exhibits no tenderness.  Audible expiratory wheezes  Abdominal: Soft. Bowel sounds are normal. There is no tenderness. There is no rebound and no guarding.  Musculoskeletal: He exhibits no edema.  Lymphadenopathy:    He has no cervical adenopathy.  Neurological: He is alert.  Skin: Skin is warm and dry. No rash noted. He is not diaphoretic.  Psychiatric: He has a normal mood and affect.  Nursing note and vitals reviewed.    ED Treatments / Results  Labs (all labs ordered are listed, but only abnormal results are displayed) Labs Reviewed  BASIC METABOLIC PANEL - Abnormal; Notable for the following components:      Result Value   Glucose, Bld 107 (*)    Calcium 8.7 (*)    All other components within normal limits  CBC WITH DIFFERENTIAL/PLATELET - Abnormal; Notable for the following components:   Eosinophils Absolute 0.9 (*)    All other components within normal limits  I-STAT TROPONIN, ED    EKG None  Radiology Dg Chest 2 View  Result Date: 06/23/2017 CLINICAL DATA:  Wheezing and shortness of breath.  History of asthma EXAM: CHEST - 2 VIEW COMPARISON:  June 14, 2017 FINDINGS: Lungs are clear. Heart size and pulmonary vascularity are normal. No adenopathy. There is upper lumbar levoscoliosis. IMPRESSION: No edema or consolidation. Electronically Signed   By: Lowella Grip III M.D.   On: 06/23/2017 11:46    Procedures Procedures (including critical care time)  CRITICAL CARE Performed by: Jillyn Ledger   Total critical care time: 35 minutes  Critical care time was exclusive of separately billable  procedures and treating other patients.  Critical care was necessary to treat or prevent imminent or life-threatening deterioration.  Critical care was time spent personally by me on the following activities: development of treatment plan with patient and/or surrogate as well as nursing, discussions with consultants, evaluation of patient's response to treatment, examination of patient, obtaining history from patient or surrogate, ordering and performing treatments and interventions, ordering and review of laboratory studies, ordering and review of radiographic studies, pulse oximetry and re-evaluation of patient's condition.   Medications Ordered in ED Medications  ipratropium-albuterol (DUONEB) 0.5-2.5 (3) MG/3ML nebulizer solution 3 mL (has no administration in time range)  methylPREDNISolone sodium succinate (SOLU-MEDROL) 125 mg/2 mL injection 125 mg (has no administration in time range)  albuterol (PROVENTIL) (2.5 MG/3ML) 0.083% nebulizer solution 5 mg (5 mg Nebulization Given 06/23/17 1021)     Initial Impression / Assessment and Plan / ED Course  I have reviewed the triage vital signs and the nursing notes.  Pertinent labs & imaging results that were available during my care of the patient were reviewed by me and considered in my medical decision making (see chart for details).     57 y.o. male presenting with several weeks of shortness of breath and wheezing.  Patient was seen here 4/15 for the same with reassuring workup.  He was discharged home on steroid burst that helped mildly but his symptoms returned shortly after.  He has been taking Symbicort and albuterol at home without relief.  Patient is PERC negative.  Patient denies any fever, worsening cough or chest pain.  Patient noted to be tachypneic with global wheezing are even auditory without auscultation.  States becomes more short of breath with minimal exertion.  Patient's lab work and chest x-ray are reassuring.  He received  IV Solu-Medrol, 2 rounds breathing treatment as well as hour-long breathing treatment in the department with continued expiratory wheezes as well as shortness of breath.  Feel he would need to be admitted for COPD exacerbation given this is his second visit for the same.  Appreciate Dr. Wendee Beavers of her bring the patient into the hospital.  He appears hemodynamically stable and safe for admission.  Final Clinical Impressions(s) / ED Diagnoses   Final diagnoses:  COPD exacerbation [facility])    ED Discharge Orders    None       Lorelle Gibbs 06/23/17 1514    Blanchie Dessert, MD 06/23/17 2137    Jillyn Ledger, PA-C 07/05/17 9381    Blanchie Dessert, MD 07/07/17 1432

## 2017-06-23 NOTE — ED Triage Notes (Signed)
Pt complaint of worsening asthma/COPD unrelieved by medication at home. Pt diminished and expiratory wheezing with triage.

## 2017-06-23 NOTE — ED Notes (Signed)
ED TO INPATIENT HANDOFF REPORT  Name/Age/Gender Jack Cunningham 57 y.o. male  Code Status   Home/SNF/Other Home  Chief Complaint asthma attack  Level of Care/Admitting Diagnosis ED Disposition    ED Disposition Condition [facility] Area: [place] [100102]  Level of Care: Med-Surg [16]  Diagnosis: COPD exacerbation [facility]) [[id number]]  Admitting Physician: Velvet Bathe [phone]  Attending Physician: Velvet Bathe [[id number]]  PT Class (Do Not Modify): Observation [104]  PT Acc Code (Do Not Modify): Observation [10022]       Medical History Past Medical History:  Diagnosis Date  . Asthma   . COPD (chronic obstructive pulmonary disease) (HCC)     Allergies No Known Allergies  IV Location/Drains/Wounds Patient Lines/Drains/Airways Status   Active Line/Drains/Airways    Name:   Placement date:   Placement time:   Site:   Days:   Peripheral IV 06/23/17 Right Forearm   06/23/17    1326    Forearm   less than 1          Labs/Imaging Results for orders placed or performed during the hospital encounter of 06/23/17 (from the past 48 hour(s))  Basic metabolic panel     Status: Abnormal   Collection Time: 06/23/17  1:29 PM  Result Value Ref Range   Sodium 143 135 - 145 mmol/L   Potassium 3.9 3.5 - 5.1 mmol/L   Chloride 107 101 - 111 mmol/L   CO2 25 22 - 32 mmol/L   Glucose, Bld 107 (H) 65 - 99 mg/dL   BUN 15 6 - 20 mg/dL   Creatinine, Ser 0.92 0.61 - 1.24 mg/dL   Calcium 8.7 (L) 8.9 - 10.3 mg/dL   GFR calc non Af Amer >60 >60 mL/min   GFR calc Af Amer >60 >60 mL/min    Comment: (NOTE) The eGFR has been calculated using the CKD EPI equation. This calculation has not been validated in all clinical situations. eGFR's persistently <60 mL/min signify possible Chronic Kidney Disease.    Anion gap 11 5 - 15    Comment: Performed at [facility], [place] [address], [place], [place] [address]  CBC with Differential      Status: Abnormal   Collection Time: 06/23/17  1:29 PM  Result Value Ref Range   WBC 8.9 4.0 - 10.5 K/uL   RBC 4.90 4.22 - 5.81 MIL/uL   Hemoglobin 14.5 13.0 - 17.0 g/dL   HCT 44.4 39.0 - 52.0 %   MCV 90.6 78.0 - 100.0 fL   MCH 29.6 26.0 - 34.0 pg   MCHC 32.7 30.0 - 36.0 g/dL   RDW 13.3 11.5 - 15.5 %   Platelets 204 150 - 400 K/uL   Neutrophils Relative % 67 %   Neutro Abs 5.9 1.7 - 7.7 K/uL   Lymphocytes Relative 13 %   Lymphs Abs 1.1 0.7 - 4.0 K/uL   Monocytes Relative 10 %   Monocytes Absolute 0.9 0.1 - 1.0 K/uL   Eosinophils Relative 10 %   Eosinophils Absolute 0.9 (H) 0.0 - 0.7 K/uL   Basophils Relative 0 %   Basophils Absolute 0.0 0.0 - 0.1 K/uL    Comment: Performed at [facility], [place] [address], [place], [place] [address]  I-stat troponin, ED     Status: None   Collection Time: 06/23/17  2:11 PM  Result Value Ref Range   Troponin i, poc 0.00 0.00 - 0.08 ng/mL   Comment 3  Comment: Due to the release kinetics of cTnI, a negative result within the first hours of the onset of symptoms does not rule out myocardial infarction with certainty. If myocardial infarction is still suspected, repeat the test at appropriate intervals.    Dg Chest 2 View  Result Date: 06/23/2017 CLINICAL DATA:  Wheezing and shortness of breath.  History of asthma EXAM: CHEST - 2 VIEW COMPARISON:  June 14, 2017 FINDINGS: Lungs are clear. Heart size and pulmonary vascularity are normal. No adenopathy. There is upper lumbar levoscoliosis. IMPRESSION: No edema or consolidation. Electronically Signed   By: Lowella Grip III M.D.   On: 06/23/2017 11:46    Pending Labs [employer] (From admission, onward)   Start     Ordered   Signed and Held  HIV antibody (Routine Testing)  Once,   R     Signed and Held   Signed and Held  CBC  (heparin)  Once,   R    Comments:  Baseline for heparin therapy IF NOT ALREADY DRAWN.  Notify MD if PLT < 100 K.    Signed and Held    Signed and Held  Creatinine, serum  (heparin)  Once,   R    Comments:  Baseline for heparin therapy IF NOT ALREADY DRAWN.    Signed and Held   Signed and Held  Basic metabolic panel  Tomorrow morning,   R     Signed and Held   Signed and Held  CBC  Tomorrow morning,   R     Signed and Held      Vitals/Pain Today's Vitals   06/23/17 1724 06/23/17 1725 06/23/17 1727 06/23/17 1730  BP: 129/72   130/83  Pulse: 95  92 95  Resp: (!) 25  (!) 23 (!) 24  Temp:      TempSrc:      SpO2: 94%  95% 95%  Weight:      Height:      PainSc:  0-No pain      Isolation Precautions No active isolations  Medications Medications  albuterol (PROVENTIL,VENTOLIN) solution continuous neb (10 mg/hr Nebulization New Bag/Given 06/23/17 1354)  methylPREDNISolone sodium succinate (SOLU-MEDROL) 40 mg/mL injection 40 mg (has no administration in time range)  albuterol (PROVENTIL) (2.5 MG/3ML) 0.083% nebulizer solution 2.5 mg (2.5 mg Nebulization Not Given 06/23/17 1640)  albuterol (PROVENTIL) (2.5 MG/3ML) 0.083% nebulizer solution 2.5 mg (has no administration in time range)  tiotropium (SPIRIVA) inhalation capsule 18 mcg (has no administration in time range)  albuterol (PROVENTIL) (2.5 MG/3ML) 0.083% nebulizer solution 5 mg (5 mg Nebulization Given 06/23/17 1021)  ipratropium-albuterol (DUONEB) 0.5-2.5 (3) MG/3ML nebulizer solution 3 mL (3 mLs Nebulization Given 06/23/17 1216)  methylPREDNISolone sodium succinate (SOLU-MEDROL) 125 mg/2 mL injection 125 mg (125 mg Intravenous Given 06/23/17 1328)  ipratropium (ATROVENT) nebulizer solution 0.5 mg (0.5 mg Nebulization Given 06/23/17 1354)    Mobility walks

## 2017-06-23 NOTE — ED Notes (Signed)
ED TO INPATIENT HANDOFF REPORT  Name/Age/Gender Jack Cunningham 57 y.o. male  Code Status    Code Status Orders  (From admission, onward)        Start     Ordered   06/23/17 1900  Full code  Continuous     06/23/17 1900    Code Status History    This patient has a current code status but no historical code status.      Home/SNF/Other Home  Chief Complaint asthma attack  Level of Care/Admitting Diagnosis ED Disposition    ED Disposition Condition Comment   Admit  Hospital Area: [place] [100102]  Level of Care: Med-Surg [16]  Diagnosis: COPD exacerbation [facility]) [[id number]]  Admitting Physician: Velvet Bathe [[id number]]  Attending Physician: Velvet Bathe [[id number]]  PT Class (Do Not Modify): Observation [104]  PT Acc Code (Do Not Modify): Observation [10022]       Medical History Past Medical History:  Diagnosis Date  . Asthma   . COPD (chronic obstructive pulmonary disease) (HCC)     Allergies No Known Allergies  IV Location/Drains/Wounds Patient Lines/Drains/Airways Status   Active Line/Drains/Airways    Name:   Placement date:   Placement time:   Site:   Days:   Peripheral IV 06/23/17 Right Forearm   06/23/17    1326    Forearm   less than 1          Labs/Imaging Results for orders placed or performed during the hospital encounter of 06/23/17 (from the past 48 hour(s))  Basic metabolic panel     Status: Abnormal   Collection Time: 06/23/17  1:29 PM  Result Value Ref Range   Sodium 143 135 - 145 mmol/L   Potassium 3.9 3.5 - 5.1 mmol/L   Chloride 107 101 - 111 mmol/L   CO2 25 22 - 32 mmol/L   Glucose, Bld 107 (H) 65 - 99 mg/dL   BUN 15 6 - 20 mg/dL   Creatinine, Ser 0.92 0.61 - 1.24 mg/dL   Calcium 8.7 (L) 8.9 - 10.3 mg/dL   GFR calc non Af Amer >60 >60 mL/min   GFR calc Af Amer >60 >60 mL/min    Comment: (NOTE) The eGFR has been calculated using the CKD EPI equation. This calculation has not been validated in all clinical  situations. eGFR's persistently <60 mL/min signify possible Chronic Kidney Disease.    Anion gap 11 5 - 15    Comment: Performed at [facility], [place] [address] [place], [place] [address]  CBC with Differential     Status: Abnormal   Collection Time: 06/23/17  1:29 PM  Result Value Ref Range   WBC 8.9 4.0 - 10.5 K/uL   RBC 4.90 4.22 - 5.81 MIL/uL   Hemoglobin 14.5 13.0 - 17.0 g/dL   HCT 44.4 39.0 - 52.0 %   MCV 90.6 78.0 - 100.0 fL   MCH 29.6 26.0 - 34.0 pg   MCHC 32.7 30.0 - 36.0 g/dL   RDW 13.3 11.5 - 15.5 %   Platelets 204 150 - 400 K/uL   Neutrophils Relative % 67 %   Neutro Abs 5.9 1.7 - 7.7 K/uL   Lymphocytes Relative 13 %   Lymphs Abs 1.1 0.7 - 4.0 K/uL   Monocytes Relative 10 %   Monocytes Absolute 0.9 0.1 - 1.0 K/uL   Eosinophils Relative 10 %   Eosinophils Absolute 0.9 (H) 0.0 - 0.7 K/uL   Basophils Relative 0 %  Basophils Absolute 0.0 0.0 - 0.1 K/uL    Comment: Performed at [facility], [place] [address], [place], [place] [address]  I-stat troponin, ED     Status: None   Collection Time: 06/23/17  2:11 PM  Result Value Ref Range   Troponin i, poc 0.00 0.00 - 0.08 ng/mL   Comment 3            Comment: Due to the release kinetics of cTnI, a negative result within the first hours of the onset of symptoms does not rule out myocardial infarction with certainty. If myocardial infarction is still suspected, repeat the test at appropriate intervals.    Dg Chest 2 View  Result Date: 06/23/2017 CLINICAL DATA:  Wheezing and shortness of breath.  History of asthma EXAM: CHEST - 2 VIEW COMPARISON:  June 14, 2017 FINDINGS: Lungs are clear. Heart size and pulmonary vascularity are normal. No adenopathy. There is upper lumbar levoscoliosis. IMPRESSION: No edema or consolidation. Electronically Signed   By: Lowella Grip III M.D.   On: 06/23/2017 11:46    Pending Labs Unresulted Labs (From admission, onward)   Start     Ordered    06/24/17 2778  Basic metabolic panel  Tomorrow morning,   R     06/23/17 1900   06/24/17 0500  CBC  Tomorrow morning,   R     06/23/17 1900   06/23/17 1900  HIV antibody (Routine Testing)  Once,   R     06/23/17 1900   06/23/17 1900  CBC  (heparin)  Once,   R    Comments:  Baseline for heparin therapy IF NOT ALREADY DRAWN.  Notify MD if PLT < 100 K.    06/23/17 1900   06/23/17 1900  Creatinine, serum  (heparin)  Once,   R    Comments:  Baseline for heparin therapy IF NOT ALREADY DRAWN.    06/23/17 1900      Vitals/Pain Today's Vitals   06/23/17 1830 06/23/17 1900 06/23/17 2000 06/23/17 2004  BP: 126/73 114/80 136/76   Pulse: 94 90 91   Resp: (!) 21 (!) 21 (!) 29   Temp:      TempSrc:      SpO2: 97% 96% 96% 95%  Weight:      Height:      PainSc:        Isolation Precautions No active isolations  Medications Medications  aspirin tablet 325 mg (has no administration in time range)  mometasone-formoterol (DULERA) 200-5 MCG/ACT inhaler 2 puff (has no administration in time range)  multivitamin with minerals tablet 1 tablet (has no administration in time range)  albuterol (PROVENTIL HFA;VENTOLIN HFA) 108 (90 Base) MCG/ACT inhaler 1-2 puff (1 puff Inhalation Not Given 06/23/17 2003)  albuterol (PROVENTIL) (2.5 MG/3ML) 0.083% nebulizer solution 2.5 mg (has no administration in time range)  heparin injection 5,000 Units (has no administration in time range)  sodium chloride flush (NS) 0.9 % injection 3 mL (has no administration in time range)  sodium chloride flush (NS) 0.9 % injection 3 mL (has no administration in time range)  0.9 %  sodium chloride infusion (has no administration in time range)  methylPREDNISolone sodium succinate (SOLU-MEDROL) 40 mg/mL injection 40 mg (has no administration in time range)  albuterol (PROVENTIL) (2.5 MG/3ML) 0.083% nebulizer solution 2.5 mg (2.5 mg Nebulization Given 06/23/17 2003)  albuterol (PROVENTIL) (2.5 MG/3ML) 0.083% nebulizer solution  2.5 mg (has no administration in time range)  tiotropium (SPIRIVA) inhalation capsule  18 mcg (has no administration in time range)  albuterol (PROVENTIL) (2.5 MG/3ML) 0.083% nebulizer solution 5 mg (5 mg Nebulization Given 06/23/17 1021)  ipratropium-albuterol (DUONEB) 0.5-2.5 (3) MG/3ML nebulizer solution 3 mL (3 mLs Nebulization Given 06/23/17 1216)  methylPREDNISolone sodium succinate (SOLU-MEDROL) 125 mg/2 mL injection 125 mg (125 mg Intravenous Given 06/23/17 1328)  ipratropium (ATROVENT) nebulizer solution 0.5 mg (0.5 mg Nebulization Given 06/23/17 1354)    Mobility walks

## 2017-06-23 NOTE — H&P (Signed)
History and Physical    Kaylen Nghiem KVQ:[id number] DOB: [birth year] DOA: 06/23/2017  PCP: Patient, No Pcp Per  Patient coming from: home   Chief Complaint: increase in sob  HPI: Jarrel Knoke is a 57 y.o. male with medical history significant of COPD presenting to the hospital complaining of increased shortness of breath, cough, sputum production. Nothing he is aware of makes it better including his home medication regimen. Given persistence symptoms patient presented to the hospital for further evaluation recommendations.  ED Course: patient was given Solu-Medrol and albuterol with improvement in condition given persistent wheezing or shortness of breath we were consulted for further evaluation recommendations.  Review of Systems: As per HPI otherwise 10 point review of systems negative.    Past Medical History:  Diagnosis Date  . Asthma   . COPD (chronic obstructive pulmonary disease) ([place])     History reviewed. No pertinent surgical history.   reports that he has never smoked. He has never used smokeless tobacco. He reports that he does not drink alcohol or use drugs.  No Known Allergies  History reviewed. No pertinent family history.   Prior to Admission medications   Medication Sig Start Date End Date Taking? Authorizing Provider  albuterol (PROVENTIL HFA;VENTOLIN HFA) 108 (90 Base) MCG/ACT inhaler Inhale 2 puffs into the lungs every 6 (six) hours as needed for wheezing or shortness of breath.   Yes [provider]  albuterol (PROVENTIL) (2.5 MG/3ML) 0.083% nebulizer solution Take 3 mLs (2.5 mg total) by nebulization every 4 (four) hours as needed for wheezing or shortness of breath. 06/14/17  Yes Jacqlyn Larsen, PA-C  aspirin 325 MG tablet Take 325 mg by mouth daily.   Yes [provider]  budesonide-formoterol (SYMBICORT) 160-4.5 MCG/ACT inhaler Inhale 2 puffs into the lungs 2 (two) times daily. 06/14/17  Yes Jacqlyn Larsen, PA-C  Multiple Vitamin  (MULTIVITAMIN WITH MINERALS) TABS tablet Take 1 tablet by mouth daily.   Yes [provider]  azithromycin (ZITHROMAX) 250 MG tablet Take 1 tablet (250 mg total) by mouth daily. Take first 2 tablets together, then 1 every day until finished. Patient not taking: Reported on 06/23/2017 06/14/17   Jacqlyn Larsen, PA-C  predniSONE (DELTASONE) 50 MG tablet Take 1 tablet (50 mg total) by mouth daily. Patient not taking: Reported on 06/23/2017 06/14/17   Jacqlyn Larsen, PA-C    Physical Exam: Vitals:   06/23/17 1013 06/23/17 1217 06/23/17 1319  BP: (!) 151/80  136/81  Pulse: 92  81  Resp: 17  (!) 24  Temp: 98.4 F (36.9 C)  97.8 F (36.6 C)  TempSrc: Oral  Oral  SpO2: 95% 95% 96%  Weight: 97.5 kg (215 lb)    Height: 5\' 11"  (1.803 m)      Constitutional: NAD, calm, comfortable Vitals:   06/23/17 1013 06/23/17 1217 06/23/17 1319  BP: (!) 151/80  136/81  Pulse: 92  81  Resp: 17  (!) 24  Temp: 98.4 F (36.9 C)  97.8 F (36.6 C)  TempSrc: Oral  Oral  SpO2: 95% 95% 96%  Weight: 97.5 kg (215 lb)    Height: 5\' 11"  (1.803 m)     Eyes: PERRL, lids and conjunctivae normal ENMT: Mucous membranes are moist. Posterior pharynx clear of any exudate or lesions. Neck: normal, supple, no masses, no thyromegaly Respiratory: equal chest rise, diffuse bilateral expiratory wheezes, no rales Cardiovascular: Regular rate and rhythm, no murmurs / rubs / gallops. No extremity edema. 2+ pedal pulses. No  carotid bruits.  Abdomen: no tenderness, no masses palpated. No hepatosplenomegaly. Bowel sounds positive.  Musculoskeletal: no clubbing / cyanosis. No joint deformity upper and lower extremities. Good ROM, no contractures. Normal muscle tone.  Skin: no rashes, lesions, ulcers. No induration   Labs on Admission: I have personally reviewed following labs and imaging studies  CBC: Recent Labs  Lab 06/23/17 1329  WBC 8.9  NEUTROABS 5.9  HGB 14.5  HCT 44.4  MCV 90.6  PLT 314   Basic Metabolic  Panel: Recent Labs  Lab 06/23/17 1329  NA 143  K 3.9  CL 107  CO2 25  GLUCOSE 107*  BUN 15  CREATININE 0.92  CALCIUM 8.7*   GFR: Estimated Creatinine Clearance: 106.8 mL/min (by C-G formula based on SCr of 0.92 mg/dL). Liver Function Tests: No results for input(s): AST, ALT, ALKPHOS, BILITOT, PROT, ALBUMIN in the last 168 hours. No results for input(s): LIPASE, AMYLASE in the last 168 hours. No results for input(s): AMMONIA in the last 168 hours. Coagulation Profile: No results for input(s): INR, PROTIME in the last 168 hours. Cardiac Enzymes: No results for input(s): CKTOTAL, CKMB, CKMBINDEX, TROPONINI in the last 168 hours. BNP (last 3 results) No results for input(s): PROBNP in the last 8760 hours. HbA1C: No results for input(s): HGBA1C in the last 72 hours. CBG: No results for input(s): GLUCAP in the last 168 hours. Lipid Profile: No results for input(s): CHOL, HDL, LDLCALC, TRIG, CHOLHDL, LDLDIRECT in the last 72 hours. Thyroid Function Tests: No results for input(s): TSH, T4TOTAL, FREET4, T3FREE, THYROIDAB in the last 72 hours. Anemia Panel: No results for input(s): VITAMINB12, FOLATE, FERRITIN, TIBC, IRON, RETICCTPCT in the last 72 hours. Urine analysis: No results found for: COLORURINE, APPEARANCEUR, LABSPEC, PHURINE, GLUCOSEU, HGBUR, BILIRUBINUR, KETONESUR, PROTEINUR, UROBILINOGEN, NITRITE, LEUKOCYTESUR  Radiological Exams on Admission: Dg Chest 2 View  Result Date: 06/23/2017 CLINICAL DATA:  Wheezing and shortness of breath.  History of asthma EXAM: CHEST - 2 VIEW COMPARISON:  June 14, 2017 FINDINGS: Lungs are clear. Heart size and pulmonary vascularity are normal. No adenopathy. There is upper lumbar levoscoliosis. IMPRESSION: No edema or consolidation. Electronically Signed   By: Lowella Grip III M.D.   On: 06/23/2017 11:46    EKG: Independently reviewed. Sinus rhythm with no st elevations or depressions  Assessment/Plan Active Problems:   COPD  exacerbation (HCC) - Place on solumedrol - continue albuterol - try trial of spiriva - place on dulera. - if no improvement on above regimen would consider adding doxycycline (abx)   DVT prophylaxis: heparin Code Status: full Family Communication: d/c patient and suspected spouse at bedside. Disposition Plan: med surg Consults called: none Admission status: obs   Velvet Bathe MD [facility] Pager [phone]  If 7PM-7AM, please contact night-coverage [email or web address] Password [facility]  06/23/2017, 4:33 PM

## 2017-06-23 NOTE — ED Notes (Signed)
RRT CALLED DEE RESPONDED. DEUNEB MEDICATION GIVEN. SEE RECORD

## 2017-06-24 ENCOUNTER — Observation Stay (HOSPITAL_COMMUNITY): Payer: Medicaid Other

## 2017-06-24 DIAGNOSIS — J45901 Unspecified asthma with (acute) exacerbation: Secondary | ICD-10-CM | POA: Diagnosis present

## 2017-06-24 DIAGNOSIS — R739 Hyperglycemia, unspecified: Secondary | ICD-10-CM | POA: Diagnosis present

## 2017-06-24 DIAGNOSIS — D72829 Elevated white blood cell count, unspecified: Secondary | ICD-10-CM

## 2017-06-24 DIAGNOSIS — Z7951 Long term (current) use of inhaled steroids: Secondary | ICD-10-CM | POA: Diagnosis not present

## 2017-06-24 DIAGNOSIS — J441 Chronic obstructive pulmonary disease with (acute) exacerbation: Secondary | ICD-10-CM | POA: Diagnosis present

## 2017-06-24 DIAGNOSIS — J449 Chronic obstructive pulmonary disease, unspecified: Secondary | ICD-10-CM | POA: Diagnosis not present

## 2017-06-24 DIAGNOSIS — Z79899 Other long term (current) drug therapy: Secondary | ICD-10-CM | POA: Diagnosis not present

## 2017-06-24 DIAGNOSIS — Z7982 Long term (current) use of aspirin: Secondary | ICD-10-CM | POA: Diagnosis not present

## 2017-06-24 LAB — BASIC METABOLIC PANEL
Anion gap: 9 (ref 5–15)
BUN: 20 mg/dL (ref 6–20)
CO2: 25 mmol/L (ref 22–32)
Calcium: 9.2 mg/dL (ref 8.9–10.3)
Chloride: 105 mmol/L (ref 101–111)
Creatinine, Ser: 0.91 mg/dL (ref 0.61–1.24)
GFR calc Af Amer: >60 mL/min (ref >60)
GLUCOSE: 140 mg/dL — AB (ref 65–99)
POTASSIUM: 4.2 mmol/L (ref 3.5–5.1)
Sodium: 139 mmol/L (ref 135–145)

## 2017-06-24 LAB — CBC
HEMATOCRIT: 44.6 % (ref 39.0–52.0)
Hemoglobin: 14.2 g/dL (ref 13.0–17.0)
MCH: 28.9 pg (ref 26.0–34.0)
MCHC: 31.8 g/dL (ref 30.0–36.0)
MCV: 90.7 fL (ref 78.0–100.0)
Platelets: 227 10*3/uL (ref 150–400)
RBC: 4.92 MIL/uL (ref 4.22–5.81)
RDW: 13.2 % (ref 11.5–15.5)
WBC: 15.3 10*3/uL — ABNORMAL HIGH (ref 4.0–10.5)

## 2017-06-24 LAB — HIV ANTIBODY (ROUTINE TESTING W REFLEX): HIV SCREEN 4TH GENERATION: NONREACTIVE

## 2017-06-24 MED ORDER — IPRATROPIUM-ALBUTEROL 0.5-2.5 (3) MG/3ML IN SOLN
3.0000 mL | Freq: Three times a day (TID) | RESPIRATORY_TRACT | Status: DC
  Administered 2017-06-24 – 2017-06-25 (×4): 3 mL via RESPIRATORY_TRACT
  Filled 2017-06-24 (×4): qty 3

## 2017-06-24 MED ORDER — IOPAMIDOL (ISOVUE-370) INJECTION 76%
100.0000 mL | Freq: Once | INTRAVENOUS | Status: AC | PRN
  Administered 2017-06-24: 100 mL via INTRAVENOUS

## 2017-06-24 MED ORDER — AZITHROMYCIN 250 MG PO TABS
500.0000 mg | ORAL_TABLET | Freq: Every day | ORAL | Status: DC
  Administered 2017-06-25: 500 mg via ORAL
  Filled 2017-06-24: qty 2

## 2017-06-24 MED ORDER — GUAIFENESIN ER 600 MG PO TB12
1200.0000 mg | ORAL_TABLET | Freq: Two times a day (BID) | ORAL | Status: DC
  Administered 2017-06-24 – 2017-06-25 (×3): 1200 mg via ORAL
  Filled 2017-06-24 (×3): qty 2

## 2017-06-24 MED ORDER — SODIUM CHLORIDE 0.9 % IV SOLN
500.0000 mg | INTRAVENOUS | Status: DC
  Administered 2017-06-24: 500 mg via INTRAVENOUS
  Filled 2017-06-24: qty 500

## 2017-06-24 NOTE — Progress Notes (Signed)
PROGRESS NOTE    Jack Cunningham  XHB:[id number] DOB: 01-26-1961 DOA: 06/23/2017 PCP: Patient, No Pcp Per   Brief Narrative:  Jack Cunningham is a 57 y.o. male with medical history significant of COPD presenting to the hospital complaining of increased shortness of breath, cough, sputum production. Nothing he is aware of makes it better including his home medication regimen. Given persistence symptoms patient presented to the hospital for further evaluation recommendations. In the ED patient was given Solu-Medrol and albuterol with improvement in condition but given persistent wheezing or shortness of breath [facility] were consulted for admission for COPD Exacerbation. Currently being treated for COPD and worked up and slowly improving.  Assessment & Plan:   Active Problems:   COPD exacerbation (HCC)  COPD Exacerbation in the setting of Asthma Exacerbation  -Patient placed on [place] has some persistent wheezing -X-ray showed that the lungs are clear and the heart size and pulmonary vascular normal there is no adenopathy and there is no edema or consolidation noted. -Discontinued albuterol scheduled breathing treatments as well as Spiriva and started DuoNebs 3 times daily scheduled -Continue with albuterol 2.5 nebs every 2 as needed for wheezing and shortness of breath -Patient was given Solu-Medrol 125 mg IV once in the ED. We will continue Solu-Medrol 40 mg IV every 8h for now and de-escalate and change to Prednisone taper at discharge -Continue with Dulera 2 puffs elevations twice daily -Started guaifenesin 1200 mg p.o. twice daily -Added flutter valve and incentive spirometer -We will check ambulatory pulse oximetry screen prior to discharge -Azithromycin 500 mg IV for COPD and will change to p.o. for total 5 days. -Check Respiratory Virus Panel  -Upon prior review this is the patient's third COPD exacerbation in the last year we will obtain a CTA of the chest to rule out a  PE  Leukocytosis -Likely from IV Steroid demargination -Patient WBC over from 8.0 -> 15.3 -Continue to monitor for signs and symptoms of infection -CBC in a.m.  Hyperglycemia -Likely steroid mediated however will need to rule out IFG/Diabetes and will get a hemoglobin A1c  Hx of ? DVT -Patient states he had a "blockage in his leg and the vein burst open." -As he was on some blood thinning medication but does not remember which one it was  History of ? 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 daily  DVT prophylaxis: Heparin 5,000 units sq q8h Code Status: FULL CODE Family Communication: No family present at bedside Disposition Plan: Anticipate D/C in next 24-48 hours if medically stable   Consultants:   None  Procedures:   None  Antimicrobials:  Anti-infectives (From admission, onward)   None     Subjective: Seen and Examined at bedside and some wheezing.  States shortness of breath has improved but not completely gone away.  Denies any chest pain, nausea, vomiting.  No other complaints or concerns.  Objective: Vitals:   06/23/17 2146 06/24/17 0352 06/24/17 0525 06/24/17 0541  BP: (!) 148/63   119/72  Pulse: 87   87  Resp: 20 (!) 22  (!) 22  Temp: 98.2 F (36.8 C)   (!) 97.5 F (36.4 C)  TempSrc: Oral   Oral  SpO2: 96%  94% 98%  Weight:      Height:        Intake/Output Summary (Last 24 hours) at 06/24/2017 8101 Last data filed at  06/24/2017 0813 Gross per 24 hour  Intake 830 ml  Output -  Net 830 ml   Filed Weights   06/23/17 1013  Weight: 97.5 kg (215 lb)   Examination: Physical Exam:  Constitutional: WN/WD AAM in NAD and appears calm and comfortable Eyes: Lids and conjunctivae normal, sclerae anicteric  ENMT: External Ears, Nose appear normal. Grossly normal hearing. Neck: Appears normal, supple, no cervical masses, normal ROM, no appreciable thyromegaly;  no JVD Respiratory: Diminished to auscultation bilaterally Had some expiratory wheezing; No appreciable rales, rhonchi or crackles. Normal respiratory effort and patient is not tachypenic. No accessory muscle use.  Cardiovascular: RRR, no murmurs / rubs / gallops. S1 and S2 auscultated. No extremity edema.  Abdomen: Soft, non-tender, Distended due to body habitus. No masses palpated. No appreciable hepatosplenomegaly. Bowel sounds positive x4.  GU: Deferred. Musculoskeletal: No clubbing / cyanosis of digits/nails. No joint deformity upper and lower extremities. .  Skin: No rashes, lesions, ulcers on a limited skin evaluation. No induration; Warm and dry.  Neurologic: CN 2-12 grossly intact with no focal deficits. Romberg sign and cerebellar reflexes not assessed.  Psychiatric: Normal judgment and insight. Alert and oriented x 3. Normal mood and appropriate affect.   Data Reviewed: I have personally reviewed following labs and imaging studies  CBC: Recent Labs  Lab 06/23/17 1329 06/23/17 2002 06/24/17 0508  WBC 8.9 8.0 15.3*  NEUTROABS 5.9  --   --   HGB 14.5 14.1 14.2  HCT 44.4 43.4 44.6  MCV 90.6 90.6 90.7  PLT 204 195 540   Basic Metabolic Panel: Recent Labs  Lab 06/23/17 1329 06/23/17 2002 06/24/17 0508  NA 143  --  139  K 3.9  --  4.2  CL 107  --  105  CO2 25  --  25  GLUCOSE 107*  --  140*  BUN 15  --  20  CREATININE 0.92 1.13 0.91  CALCIUM 8.7*  --  9.2   GFR: Estimated Creatinine Clearance: 107.9 mL/min (by C-G formula based on SCr of 0.91 mg/dL). Liver Function Tests: No results for input(s): AST, ALT, ALKPHOS, BILITOT, PROT, ALBUMIN in the last 168 hours. No results for input(s): LIPASE, AMYLASE in the last 168 hours. No results for input(s): AMMONIA in the last 168 hours. Coagulation Profile: No results for input(s): INR, PROTIME in the last 168 hours. Cardiac Enzymes: No results for input(s): CKTOTAL, CKMB, CKMBINDEX, TROPONINI in the last 168 hours. BNP  (last 3 results) No results for input(s): PROBNP in the last 8760 hours. HbA1C: No results for input(s): HGBA1C in the last 72 hours. CBG: No results for input(s): GLUCAP in the last 168 hours. Lipid Profile: No results for input(s): CHOL, HDL, LDLCALC, TRIG, CHOLHDL, LDLDIRECT in the last 72 hours. Thyroid Function Tests: No results for input(s): TSH, T4TOTAL, FREET4, T3FREE, THYROIDAB in the last 72 hours. Anemia Panel: No results for input(s): VITAMINB12, FOLATE, FERRITIN, TIBC, IRON, RETICCTPCT in the last 72 hours. Sepsis Labs: No results for input(s): PROCALCITON, LATICACIDVEN in the last 168 hours.  No results found for this or any previous visit (from the past 240 hour(s)).   Radiology Studies: Dg Chest 2 View  Result Date: 06/23/2017 CLINICAL DATA:  Wheezing and shortness of breath.  History of asthma EXAM: CHEST - 2 VIEW COMPARISON:  June 14, 2017 FINDINGS: Lungs are clear. Heart size and pulmonary vascularity are normal. No adenopathy. There is upper lumbar levoscoliosis. IMPRESSION: No edema or consolidation. Electronically Signed   By: Gwyndolyn Saxon  Jasmine December III M.D.   On: 06/23/2017 11:46   Scheduled Meds: . albuterol  2.5 mg Nebulization TID  . aspirin  325 mg Oral Daily  . heparin  5,000 Units Subcutaneous Q8H  . methylPREDNISolone (SOLU-MEDROL) injection  40 mg Intravenous Q8H  . mometasone-formoterol  2 puff Inhalation BID  . multivitamin with minerals  1 tablet Oral Daily  . sodium chloride flush  3 mL Intravenous Q12H  . tiotropium  18 mcg Inhalation Daily   Continuous Infusions: . sodium chloride      LOS: 0 days   Kerney Elbe, DO [facility] Pager [phone]  If 7PM-7AM, please contact night-coverage [email or web address] Password [facility] 06/24/2017, 8:38 AM

## 2017-06-24 NOTE — Plan of Care (Signed)
Pt alert and oriented, resting w no complaints. RN will monitor.

## 2017-06-24 NOTE — Progress Notes (Signed)
Pt back from CT

## 2017-06-24 NOTE — Progress Notes (Signed)
Pharmacy IV to PO conversion  This patient is receiving Azithromycin by the intravenous route. Based on criteria approved by the Pharmacy and Therapeutics Committee, and the Infectious Disease Division, the antibiotic(s) is/are being converted to equivalent oral dose form(s). These criteria include:   Patient being treated for a respiratory tract infection, urinary tract infection, cellulitis, or Clostridium Difficile Associated Diarrhea  The patient is not neutropenic and does not exhibit a GI malabsorption state  The patient is eating (either orally or per tube) and/or has been taking other orally administered medications for at least 24 hours.  The patient is improving clinically (physician or patient assessment and a 24-hour Tmax of <=100.5 F)  If you have any questions about this conversion, please contact the Pharmacy Department (ext [phone]).  Thank you.  Reuel Boom, PharmD, BCPS [phone] 06/24/2017, 12:16 PM

## 2017-06-24 NOTE — Progress Notes (Signed)
Pt taken to CT.

## 2017-06-25 ENCOUNTER — Inpatient Hospital Stay (HOSPITAL_COMMUNITY): Payer: Medicaid Other

## 2017-06-25 DIAGNOSIS — J449 Chronic obstructive pulmonary disease, unspecified: Secondary | ICD-10-CM

## 2017-06-25 LAB — HEMOGLOBIN A1C
Hgb A1c MFr Bld: 5.1 % (ref 4.8–5.6)
MEAN PLASMA GLUCOSE: 99.67 mg/dL

## 2017-06-25 LAB — CBC WITH DIFFERENTIAL/PLATELET
BASOS ABS: 0 10*3/uL (ref 0.0–0.1)
BASOS PCT: 0 %
EOS ABS: 0 10*3/uL (ref 0.0–0.7)
EOS PCT: 0 %
HCT: 46.1 % (ref 39.0–52.0)
Hemoglobin: 15.1 g/dL (ref 13.0–17.0)
LYMPHS PCT: 3 %
Lymphs Abs: 0.8 10*3/uL (ref 0.7–4.0)
MCH: 29.4 pg (ref 26.0–34.0)
MCHC: 32.8 g/dL (ref 30.0–36.0)
MCV: 89.9 fL (ref 78.0–100.0)
Monocytes Absolute: 1.3 10*3/uL — ABNORMAL HIGH (ref 0.1–1.0)
Monocytes Relative: 5 %
Neutro Abs: 22.3 10*3/uL — ABNORMAL HIGH (ref 1.7–7.7)
Neutrophils Relative %: 92 %
PLATELETS: 256 10*3/uL (ref 150–400)
RBC: 5.13 MIL/uL (ref 4.22–5.81)
RDW: 13.3 % (ref 11.5–15.5)
WBC: 24.5 10*3/uL — AB (ref 4.0–10.5)

## 2017-06-25 LAB — RESPIRATORY PANEL BY PCR
Adenovirus: NOT DETECTED
BORDETELLA PERTUSSIS-RVPCR: NOT DETECTED
CORONAVIRUS OC43-RVPPCR: NOT DETECTED
Chlamydophila pneumoniae: NOT DETECTED
Coronavirus 229E: NOT DETECTED
Coronavirus HKU1: NOT DETECTED
Coronavirus NL63: NOT DETECTED
INFLUENZA A-RVPPCR: NOT DETECTED
INFLUENZA B-RVPPCR: NOT DETECTED
METAPNEUMOVIRUS-RVPPCR: NOT DETECTED
Mycoplasma pneumoniae: NOT DETECTED
PARAINFLUENZA VIRUS 1-RVPPCR: NOT DETECTED
PARAINFLUENZA VIRUS 2-RVPPCR: NOT DETECTED
PARAINFLUENZA VIRUS 3-RVPPCR: NOT DETECTED
PARAINFLUENZA VIRUS 4-RVPPCR: NOT DETECTED
RESPIRATORY SYNCYTIAL VIRUS-RVPPCR: NOT DETECTED
RHINOVIRUS / ENTEROVIRUS - RVPPCR: NOT DETECTED

## 2017-06-25 LAB — COMPREHENSIVE METABOLIC PANEL
ALT: 29 U/L (ref 17–63)
AST: 18 U/L (ref 15–41)
Albumin: 3.6 g/dL (ref 3.5–5.0)
Alkaline Phosphatase: 40 U/L (ref 38–126)
Anion gap: 11 (ref 5–15)
BUN: 23 mg/dL — AB (ref 6–20)
CHLORIDE: 105 mmol/L (ref 101–111)
CO2: 25 mmol/L (ref 22–32)
Calcium: 9 mg/dL (ref 8.9–10.3)
Creatinine, Ser: 0.91 mg/dL (ref 0.61–1.24)
Glucose, Bld: 154 mg/dL — ABNORMAL HIGH (ref 65–99)
POTASSIUM: 4.4 mmol/L (ref 3.5–5.1)
SODIUM: 141 mmol/L (ref 135–145)
Total Bilirubin: 0.9 mg/dL (ref 0.3–1.2)
Total Protein: 7.1 g/dL (ref 6.5–8.1)

## 2017-06-25 LAB — PHOSPHORUS: PHOSPHORUS: 2.9 mg/dL (ref 2.5–4.6)

## 2017-06-25 LAB — MAGNESIUM: MAGNESIUM: 2 mg/dL (ref 1.7–2.4)

## 2017-06-25 MED ORDER — ALBUTEROL SULFATE (2.5 MG/3ML) 0.083% IN NEBU: 2.5000 mg | INHALATION_SOLUTION | RESPIRATORY_TRACT | 0 refills | Status: DC | PRN

## 2017-06-25 MED ORDER — TIOTROPIUM BROMIDE MONOHYDRATE 18 MCG IN CAPS: 18.0000 ug | ORAL_CAPSULE | Freq: Every day | RESPIRATORY_TRACT | 2 refills | Status: DC

## 2017-06-25 MED ORDER — GUAIFENESIN ER 600 MG PO TB12: 1200.0000 mg | ORAL_TABLET | Freq: Two times a day (BID) | ORAL | 0 refills | Status: DC

## 2017-06-25 MED ORDER — AZITHROMYCIN 250 MG PO TABS: 500.0000 mg | ORAL_TABLET | Freq: Every day | ORAL | 0 refills | Status: DC

## 2017-06-25 MED ORDER — PREDNISONE 10 MG (21) PO TBPK: ORAL_TABLET | ORAL | 0 refills | Status: DC

## 2017-06-25 MED ORDER — AZITHROMYCIN 250 MG PO TABS: 500.0000 mg | ORAL_TABLET | Freq: Every day | ORAL | 0 refills | Status: AC

## 2017-06-25 NOTE — Progress Notes (Deleted)
Gave pt discharge paperwork. He needs Rx for Zithromax & Prednisone. Paged MD. Awaiting reply.

## 2017-06-25 NOTE — Plan of Care (Signed)
Care plan met 

## 2017-06-25 NOTE — Progress Notes (Signed)
Spoke with patient at bedside. States he does not have PCP, provided information for [employer]. Discussed with him that generally Medicaid assigns PCP, he needs to contact DSS to see who he is assigned to and if he wants to change then they will reassign him. He understood and plans to f/u with DSS. [phone]

## 2017-06-25 NOTE — Progress Notes (Signed)
O2 sat at rest on room air: 100%. O2 sat ambulating on room air 100%. Donne Hazel, RN

## 2017-06-25 NOTE — Progress Notes (Signed)
Patient discharge home. Discharge instructions given to the patient,, he verbalized instructions and had no questions. Rx given. He left the floor with daughter in stable condition.

## 2017-06-25 NOTE — Discharge Summary (Signed)
Physician Discharge Summary  Jack Cunningham LNL:[id number] DOB: 1960-10-24 DOA: 06/23/2017  PCP: Patient, No Pcp Per  Admit date: 06/23/2017 Discharge date: 06/25/2017  Admitted From: Home Disposition:  Home  Recommendations for Outpatient Follow-up:  1. Follow up and establish with PCP in 1-2 weeks 2. Have PCP refer to Pulmonary for official PFT's and Evaluation for Recurrent COPD 3. Repeat CXR in 3 to 6 weeks 4. Please obtain CMP/CBC, Mag, Phos in one week 5. Please follow up on the following pending results:  Home Health: No Equipment/Devices: None  Discharge Condition: Stable CODE STATUS: FULL CODE  Diet recommendation:   Brief/Interim Summary: Jack Bridgesis a 57 y.o.malewith medical history significant ofCOPD presenting to the hospital complaining of increased shortness of breath, cough, sputum production. Nothing he is aware of makes it better including his home medication regimen. Given persistence symptoms patient presented to the hospital for further evaluation recommendations. In the ED patient was given Solu-Medrol and albuterol with improvement in condition but given persistent wheezing or shortness of breath [facility] were consulted for admission for COPD Exacerbation. He was treated for COPD bronchodilators, steroids, antibiotics.  He was ruled out for a pulmonary embolus.  He steadily improved and was deemed medically stable to be discharged home at this time and will need to follow-up with PCP and have an outpatient pulmonary evaluation for official PFTs and evaluation for recurrent COPD.  Discharge Diagnoses:  Active Problems:   COPD exacerbation (HCC)   COPD (chronic obstructive pulmonary disease) (HCC)  COPD Exacerbation in the setting of Asthma Exacerbation  -Patient placed on MedSurg -Still has some persistent wheezing -X-ray showed that the lungs are clear and the heart size and pulmonary vascular normal there is no adenopathy and there is no edema or  consolidation noted. -Discontinued albuterol scheduled breathing treatments as well as Spiriva and started DuoNebs 3 times daily scheduled; will discharge on home medications and add Spiriva -Continue with albuterol 2.5 nebs every 2 as needed for wheezing and shortness of breath -Patient was given Solu-Medrol 125 mg IV once in the ED. We will continue Solu-Medrol 40 mg IV every 8h for now and de-escalate and changed to Prednisone taper at discharge -Continue with Dulera 2 puffs elevations twice daily -Started guaifenesin 1200 mg p.o. twice daily -Added flutter valve and incentive spirometer -We will check ambulatory pulse oximetry screen prior to discharge -Azithromycin 500 mg IV for COPD and will change to p.o. for total 5 days. -Checked Respiratory Virus Panel was negative -Upon prior review this is the patient's third COPD exacerbation in the last year we will obtain a CTA of the chest to rule out a PE -CT of the chest showed adequate study showing no acute pulmonary embolus.  There is aortic atherosclerosis and no evidence for any other acute abnormality. -Patient improved significantly with no longer wheezing at discharge. -We will need to follow-up with Primary care physician as well as pulmonary as an outpatient  Leukocytosis -Likely from IV Steroid demargination -Patient WBC over from 8.0 -> 15.3 -> 24.5 -Continue to monitor for signs and symptoms of infection -CBC as an outpatient  Hyperglycemia -Likely steroid mediated however will need to  -Rule out IFG/Diabetes as Hemoglobin A1c is 5.1 -Follow-up as an outpatient with PCP  Hx of ? DVT -Patient states he had a "blockage in his leg and the vein burst open." -As he was on some blood thinning medication but does not remember which one it was -Follow-up with PCP  History of ? MI -Patient  states that he had an MI at the age of 39 but states that he had no stents placed -Continue with Aspirin 325 mg p.o.  Daily -PCP  Discharge Instructions Discharge Instructions    Call MD for:  difficulty breathing, headache or visual disturbances   Complete by:  As directed    Call MD for:  extreme fatigue   Complete by:  As directed    Call MD for:  hives   Complete by:  As directed    Call MD for:  persistant dizziness or light-headedness   Complete by:  As directed    Call MD for:  persistant nausea and vomiting   Complete by:  As directed    Call MD for:  redness, tenderness, or signs of infection (pain, swelling, redness, odor or green/yellow discharge around incision site)   Complete by:  As directed    Call MD for:  severe uncontrolled pain   Complete by:  As directed    Call MD for:  temperature >100.4   Complete by:  As directed    Diet - low sodium heart healthy   Complete by:  As directed    Discharge instructions   Complete by:  As directed    Follow-up and establish with primary care physician within 1 to 2 weeks.  Her primary care physician referred to a pulmonologist for official pulmonary function testing.  Take all medications as prescribed.  If symptoms change or worsen please return to the emergency room for evaluation.   Increase activity slowly   Complete by:  As directed      Allergies as of 06/25/2017   No Known Allergies     Medication List    STOP taking these medications   predniSONE 50 MG tablet Commonly known as:  DELTASONE Replaced by:  predniSONE 10 MG (21) Tbpk tablet     TAKE these medications   albuterol 108 (90 Base) MCG/ACT inhaler Commonly known as:  PROVENTIL HFA;VENTOLIN HFA Inhale 2 puffs into the lungs every 6 (six) hours as needed for wheezing or shortness of breath.   albuterol (2.5 MG/3ML) 0.083% nebulizer solution Commonly known as:  PROVENTIL Take 3 mLs (2.5 mg total) by nebulization every 4 (four) hours as needed for wheezing or shortness of breath.   aspirin 325 MG tablet Take 325 mg by mouth daily.   azithromycin 250 MG  tablet Commonly known as:  ZITHROMAX Take 2 tablets (500 mg total) by mouth daily for 3 days. What changed:    how much to take  additional instructions   budesonide-formoterol 160-4.5 MCG/ACT inhaler Commonly known as:  SYMBICORT Inhale 2 puffs into the lungs 2 (two) times daily.   guaiFENesin 600 MG 12 hr tablet Commonly known as:  MUCINEX Take 2 tablets (1,200 mg total) by mouth 2 (two) times daily.   multivitamin with minerals Tabs tablet Take 1 tablet by mouth daily.   predniSONE 10 MG (21) Tbpk tablet Commonly known as:  STERAPRED UNI-PAK 21 TAB 6 tablets day 1, 5 tablets day 2, 4 tablets day 3, 3 tablets day 4, 2 tablets day 5, 1 tablet day 6, and Stop Day 7 Replaces:  predniSONE 50 MG tablet   tiotropium 18 MCG inhalation capsule Commonly known as:  SPIRIVA HANDIHALER Place 1 capsule (18 mcg total) into inhaler and inhale daily.      Follow-up Information    [facility]. Schedule an appointment as soon as possible for a visit in  2 week(s).   Contact information: [address] [place] [address] [phone]         No Known Allergies  Consultations:  None  Procedures/Studies: Dg Chest 2 View  Result Date: 06/23/2017 CLINICAL DATA:  Wheezing and shortness of breath.  History of asthma EXAM: CHEST - 2 VIEW COMPARISON:  June 14, 2017 FINDINGS: Lungs are clear. Heart size and pulmonary vascularity are normal. No adenopathy. There is upper lumbar levoscoliosis. IMPRESSION: No edema or consolidation. Electronically Signed   By: Lowella Grip III M.D.   On: 06/23/2017 11:46   Dg Chest 2 View  Result Date: 06/14/2017 CLINICAL DATA:  Shortness of Breath EXAM: CHEST - 2 VIEW COMPARISON:  09/25/2016 FINDINGS: Heart and mediastinal contours are within normal limits. No focal opacities or effusions. No acute bony abnormality. IMPRESSION: No active cardiopulmonary disease. Electronically Signed   By: Rolm Baptise M.D.   On: 06/14/2017 11:41   Ct Angio Chest Pe W Or Wo Contrast  Result Date: 06/24/2017 CLINICAL DATA:  History of significant COPD presenting to the hospital complaining of increased shortness of breath, cough, sputum production. EXAM: CT ANGIOGRAPHY CHEST WITH CONTRAST TECHNIQUE: Multidetector CT imaging of the chest was performed using the standard protocol during bolus administration of intravenous contrast. Multiplanar CT image reconstructions and MIPs were obtained to evaluate the vascular anatomy. CONTRAST:  143mL ISOVUE-370 IOPAMIDOL (ISOVUE-370) INJECTION 76% COMPARISON:  06/23/2017 FINDINGS: Cardiovascular: The heart size is normal. No pericardial effusion. The pulmonary arteries are well opacified by contrast bolus. There is no acute pulmonary embolus. No significant coronary artery calcification. There is atherosclerotic calcification of the thoracic aortic arch, not associated with aneurysm. Mediastinum/Nodes: Esophagus is normal in appearance. The visualized portion of the thyroid gland has a normal appearance. No mediastinal, hilar, or axillary adenopathy. Lungs/Pleura: Lungs are clear. No pleural effusion or pneumothorax. Upper Abdomen: No acute abnormality. Musculoskeletal: No chest wall abnormality. No acute or significant osseous findings. Review of the MIP images confirms the above findings. IMPRESSION: 1. Technically adequate exam showing no acute pulmonary embolus. 2.  Aortic atherosclerosis.  (ICD10-I70.0) 3.  No evidence for acute  abnormality. Electronically Signed   By: Nolon Nations M.D.   On: 06/24/2017 14:43   Dg Chest Port 1 View  Result Date: 06/25/2017 CLINICAL DATA:  Shortness of breath, history asthma and COPD EXAM: PORTABLE CHEST 1 VIEW COMPARISON:  Portable exam 0445 hours compared to 06/23/2017 FINDINGS: Normal heart size, mediastinal contours, and pulmonary vascularity. Minimal central peribronchial thickening consistent with history of asthma. No acute  infiltrate, pleural effusion or pneumothorax. Bones unremarkable. IMPRESSION: No acute abnormalities. Electronically Signed   By: Lavonia Dana M.D.   On: 06/25/2017 08:54    Subjective: Seen and examined at bedside and is much improved.  Denies any chest pain, shortness breath, nausea, vomiting.  States wheezing is significantly better.  Later the halls and did not require any oxygen.  Will be discharged home and will need to follow-up with PCP as well as pulmonary in the outpatient setting.  Discharge Exam: Vitals:   06/25/17 0836 06/25/17 1305  BP:    Pulse:    Resp:    Temp:    SpO2: 95% 100%   Vitals:   06/24/17 2122 06/25/17 0615 06/25/17 0836 06/25/17 1305  BP:  129/85    Pulse:  83    Resp:  18    Temp:  98 F (36.7 C)    TempSrc:  Oral    SpO2:  95% 96% 95% 100%  Weight:      Height:       General: Pt is alert, awake, not in acute distress Cardiovascular: RRR, S1/S2 +, no rubs, no gallops Respiratory: Diminished bilaterally, no wheezing, no rhonchi Abdominal: Soft, NT, Distended due to body habitus, bowel sounds + Extremities: no edema, no cyanosis  The results of significant diagnostics from this hospitalization (including imaging, microbiology, ancillary and laboratory) are listed below for reference.    Microbiology: Recent Results (from the past 240 hour(s))  Respiratory Panel by PCR     Status: None   Collection Time: 06/24/17 12:35 PM  Result Value Ref Range Status   Adenovirus NOT DETECTED NOT DETECTED Final   Coronavirus 229E NOT DETECTED NOT DETECTED Final   Coronavirus HKU1 NOT DETECTED NOT DETECTED Final   Coronavirus NL63 NOT DETECTED NOT DETECTED Final   Coronavirus OC43 NOT DETECTED NOT DETECTED Final   Metapneumovirus NOT DETECTED NOT DETECTED Final   Rhinovirus / Enterovirus NOT DETECTED NOT DETECTED Final   Influenza A NOT DETECTED NOT DETECTED Final   Influenza B NOT DETECTED NOT DETECTED Final   Parainfluenza Virus 1 NOT DETECTED NOT DETECTED  Final   Parainfluenza Virus 2 NOT DETECTED NOT DETECTED Final   Parainfluenza Virus 3 NOT DETECTED NOT DETECTED Final   Parainfluenza Virus 4 NOT DETECTED NOT DETECTED Final   Respiratory Syncytial Virus NOT DETECTED NOT DETECTED Final   Bordetella pertussis NOT DETECTED NOT DETECTED Final   Chlamydophila pneumoniae NOT DETECTED NOT DETECTED Final   Mycoplasma pneumoniae NOT DETECTED NOT DETECTED Final    Labs: BNP (last 3 results) No results for input(s): BNP in the last 8760 hours. Basic Metabolic Panel: Recent Labs  Lab 06/23/17 1329 06/23/17 2002 06/24/17 0508 06/25/17 0446  NA 143  --  139 141  K 3.9  --  4.2 4.4  CL 107  --  105 105  CO2 25  --  25 25  GLUCOSE 107*  --  140* 154*  BUN 15  --  20 23*  CREATININE 0.92 1.13 0.91 0.91  CALCIUM 8.7*  --  9.2 9.0  MG  --   --   --  2.0  PHOS  --   --   --  2.9   Liver Function Tests: Recent Labs  Lab 06/25/17 0446  AST 18  ALT 29  ALKPHOS 40  BILITOT 0.9  PROT 7.1  ALBUMIN 3.6   No results for input(s): LIPASE, AMYLASE in the last 168 hours. No results for input(s): AMMONIA in the last 168 hours. CBC: Recent Labs  Lab 06/23/17 1329 06/23/17 2002 06/24/17 0508 06/25/17 0446  WBC 8.9 8.0 15.3* 24.5*  NEUTROABS 5.9  --   --  22.3*  HGB 14.5 14.1 14.2 15.1  HCT 44.4 43.4 44.6 46.1  MCV 90.6 90.6 90.7 89.9  PLT 204 195 227 256   Cardiac Enzymes: No results for input(s): CKTOTAL, CKMB, CKMBINDEX, TROPONINI in the last 168 hours. BNP: Invalid input(s): POCBNP CBG: No results for input(s): GLUCAP in the last 168 hours. D-Dimer No results for input(s): DDIMER in the last 72 hours. Hgb A1c Recent Labs    06/25/17 0446  HGBA1C 5.1   Lipid Profile No results for input(s): CHOL, HDL, LDLCALC, TRIG, CHOLHDL, LDLDIRECT in the last 72 hours. Thyroid function studies No results for input(s): TSH, T4TOTAL, T3FREE, THYROIDAB in the last 72 hours.  Invalid input(s): FREET3 Anemia work up No results for  input(s): VITAMINB12, FOLATE, FERRITIN,  TIBC, IRON, RETICCTPCT in the last 72 hours. Urinalysis No results found for: COLORURINE, APPEARANCEUR, [place], [place], [place], [place], [place], [place], [place], UROBILINOGEN, NITRITE, LEUKOCYTESUR Sepsis Labs Invalid input(s): PROCALCITONIN,  WBC,  LACTICIDVEN Microbiology Recent Results (from the past 240 hour(s))  Respiratory Panel by PCR     Status: None   Collection Time: 06/24/17 12:35 PM  Result Value Ref Range Status   Adenovirus NOT DETECTED NOT DETECTED Final   Coronavirus 229E NOT DETECTED NOT DETECTED Final   Coronavirus HKU1 NOT DETECTED NOT DETECTED Final   Coronavirus NL63 NOT DETECTED NOT DETECTED Final   Coronavirus OC43 NOT DETECTED NOT DETECTED Final   Metapneumovirus NOT DETECTED NOT DETECTED Final   Rhinovirus / Enterovirus NOT DETECTED NOT DETECTED Final   Influenza A NOT DETECTED NOT DETECTED Final   Influenza B NOT DETECTED NOT DETECTED Final   Parainfluenza Virus 1 NOT DETECTED NOT DETECTED Final   Parainfluenza Virus 2 NOT DETECTED NOT DETECTED Final   Parainfluenza Virus 3 NOT DETECTED NOT DETECTED Final   Parainfluenza Virus 4 NOT DETECTED NOT DETECTED Final   Respiratory Syncytial Virus NOT DETECTED NOT DETECTED Final   Bordetella pertussis NOT DETECTED NOT DETECTED Final   Chlamydophila pneumoniae NOT DETECTED NOT DETECTED Final   Mycoplasma pneumoniae NOT DETECTED NOT DETECTED Final   Time coordinating discharge: 35 minutes  SIGNED:  Kerney Elbe, DO [facility] 06/25/2017, 1:44 PM Pager [phone]  If 7PM-7AM, please contact night-coverage [email or web address] Password [email or web address]

## 2017-10-08 ENCOUNTER — Encounter (HOSPITAL_COMMUNITY): Payer: Self-pay | Admitting: Emergency Medicine

## 2017-10-08 ENCOUNTER — Other Ambulatory Visit: Payer: Self-pay

## 2017-10-08 ENCOUNTER — Emergency Department (HOSPITAL_COMMUNITY)
Admission: EM | Admit: 2017-10-08 | Discharge: 2017-10-08 | Disposition: A | Discharge status: Home / Self Care | Payer: Medicaid Other | Attending: Emergency Medicine | Admitting: Emergency Medicine

## 2017-10-08 ENCOUNTER — Emergency Department (HOSPITAL_COMMUNITY): Payer: Medicaid Other

## 2017-10-08 DIAGNOSIS — R062 Wheezing: Secondary | ICD-10-CM | POA: Insufficient documentation

## 2017-10-08 DIAGNOSIS — J441 Chronic obstructive pulmonary disease with (acute) exacerbation: Secondary | ICD-10-CM | POA: Insufficient documentation

## 2017-10-08 DIAGNOSIS — Z7982 Long term (current) use of aspirin: Secondary | ICD-10-CM | POA: Insufficient documentation

## 2017-10-08 DIAGNOSIS — Z79899 Other long term (current) drug therapy: Secondary | ICD-10-CM | POA: Insufficient documentation

## 2017-10-08 DIAGNOSIS — R0602 Shortness of breath: Secondary | ICD-10-CM | POA: Insufficient documentation

## 2017-10-08 MED ORDER — ALBUTEROL SULFATE (5 MG/ML) 0.5% IN NEBU: 2.5000 mg | INHALATION_SOLUTION | Freq: Four times a day (QID) | RESPIRATORY_TRACT | 12 refills | Status: AC | PRN

## 2017-10-08 MED ORDER — ALBUTEROL SULFATE (2.5 MG/3ML) 0.083% IN NEBU
5.0000 mg | INHALATION_SOLUTION | Freq: Once | RESPIRATORY_TRACT | Status: AC
  Administered 2017-10-08: 5 mg via RESPIRATORY_TRACT
  Filled 2017-10-08: qty 6

## 2017-10-08 MED ORDER — IPRATROPIUM-ALBUTEROL 0.5-2.5 (3) MG/3ML IN SOLN
3.0000 mL | Freq: Once | RESPIRATORY_TRACT | Status: AC
  Administered 2017-10-08: 3 mL via RESPIRATORY_TRACT
  Filled 2017-10-08: qty 3

## 2017-10-08 MED ORDER — METHYLPREDNISOLONE SODIUM SUCC 125 MG IJ SOLR
125.0000 mg | Freq: Once | INTRAMUSCULAR | Status: AC
  Administered 2017-10-08: 125 mg via INTRAMUSCULAR
  Filled 2017-10-08: qty 2

## 2017-10-08 MED ORDER — AZITHROMYCIN 250 MG PO TABS: 250.0000 mg | ORAL_TABLET | Freq: Every day | ORAL | 0 refills | Status: DC

## 2017-10-08 MED ORDER — PREDNISONE 20 MG PO TABS: 60.0000 mg | ORAL_TABLET | Freq: Every day | ORAL | 0 refills | Status: AC

## 2017-10-08 MED ORDER — BUDESONIDE-FORMOTEROL FUMARATE 160-4.5 MCG/ACT IN AERO: 2.0000 | INHALATION_SPRAY | Freq: Every day | RESPIRATORY_TRACT | 12 refills | Status: AC

## 2017-10-08 MED ORDER — ALBUTEROL SULFATE HFA 108 (90 BASE) MCG/ACT IN AERS
2.0000 | INHALATION_SPRAY | Freq: Once | RESPIRATORY_TRACT | Status: AC
  Administered 2017-10-08: 2 via RESPIRATORY_TRACT
  Filled 2017-10-08: qty 6.7

## 2017-10-08 MED ORDER — ALBUTEROL SULFATE (5 MG/ML) 0.5% IN NEBU: 2.5000 mg | INHALATION_SOLUTION | Freq: Four times a day (QID) | RESPIRATORY_TRACT | 12 refills | Status: DC | PRN

## 2017-10-08 MED ORDER — PREDNISONE 20 MG PO TABS: 60.0000 mg | ORAL_TABLET | Freq: Every day | ORAL | 0 refills | Status: DC

## 2017-10-08 NOTE — ED Provider Notes (Signed)
[facility] DEPT Provider Note   CSN: [id number] Arrival date & time: 10/08/17  1226     History   Chief Complaint Chief Complaint  Patient presents with  . Asthma    HPI Jack Cunningham is a 57 y.o. male.  HPI   Patient is a 57 year old male with a history of asthma, COPD who presents the emergency department today complaining of a productive cough with yellow sputum, wheezing, shortness of breath for the last week.  Patient states that he ran out of all of his 2 days ago and his symptoms have worsened since then.  Denies any significant pain in his chest.  No swelling to his legs.  No recent hospital admissions, hemoptysis, history of cancer, recent surgeries or hormone therapies.  Denies any other symptoms.  States that his symptoms are exacerbated by switching from cold to hot temperatures. he works in cold temperatures at work and his symptoms seem to be worse while he is there.  Past Medical History:  Diagnosis Date  . Asthma   . COPD (chronic obstructive pulmonary disease) [facility])     Patient Active Problem List   Diagnosis Date Noted  . COPD (chronic obstructive pulmonary disease) ([place]) 06/24/2017  . COPD exacerbation ([place]) 06/23/2017    History reviewed. No pertinent surgical history.      Home Medications    Prior to Admission medications   Medication Sig Start Date End Date Taking? Authorizing Provider  albuterol (PROVENTIL) (5 MG/ML) 0.5% nebulizer solution Take 0.5 mLs (2.5 mg total) by nebulization every 6 (six) hours as needed for wheezing or shortness of breath. 10/08/17   Amilliana Hayworth S, PA-C  aspirin 325 MG tablet Take 325 mg by mouth daily.    [provider]  azithromycin (ZITHROMAX Z-PAK) 250 MG tablet Take 1 tablet (250 mg total) by mouth daily. Take 2 tablets on the first day of treatment. Then take 1 tablet per day for the next four days. 10/08/17   Yeila Morro S, PA-C  budesonide-formoterol (SYMBICORT)  160-4.5 MCG/ACT inhaler Inhale 2 puffs into the lungs daily. 10/08/17   Henslee Lottman S, PA-C  guaiFENesin (MUCINEX) 600 MG 12 hr tablet Take 2 tablets (1,200 mg total) by mouth 2 (two) times daily. 06/25/17   Raiford Noble Latif, DO  Multiple Vitamin (MULTIVITAMIN WITH MINERALS) TABS tablet Take 1 tablet by mouth daily.    [provider]  predniSONE (DELTASONE) 20 MG tablet Take 3 tablets (60 mg total) by mouth daily for 5 days. 10/08/17 10/13/17  Kaislee Chao S, PA-C  tiotropium (SPIRIVA HANDIHALER) 18 MCG inhalation capsule Place 1 capsule (18 mcg total) into inhaler and inhale daily. 06/25/17 06/25/18  Kerney Elbe, DO    Family History History reviewed. No pertinent family history.  Social History Social History   Tobacco Use  . Smoking status: Never Smoker  . Smokeless tobacco: Never Used  Substance Use Topics  . Alcohol use: No  . Drug use: No     Allergies   Patient has no known allergies.   Review of Systems Review of Systems  Constitutional: Negative for fever.  HENT: Negative for sore throat.   Eyes: Negative for visual disturbance.  Respiratory: Positive for cough, shortness of breath and wheezing.   Cardiovascular: Negative for chest pain and leg swelling.  Gastrointestinal: Negative for abdominal pain, nausea and vomiting.  Genitourinary: Negative for flank pain.  Musculoskeletal: Negative for back pain.  Skin: Negative for color change.  Neurological: Negative for  headaches.     Physical Exam Updated Vital Signs BP (!) 160/86   Pulse 68   Temp 98.9 F (37.2 C) (Oral)   Resp 20   SpO2 97%   Physical Exam  Constitutional: He appears well-developed and well-nourished. No distress.  HENT:  Head: Normocephalic and atraumatic.  Mouth/Throat: Oropharynx is clear and moist.  Eyes: Conjunctivae are normal.  Neck: Neck supple.  Cardiovascular: Normal rate, regular rhythm, normal heart sounds and intact distal pulses.  No murmur  heard. Pulmonary/Chest: Effort normal. No respiratory distress.  No tachypnea.  Patient is speaking in full sentences in no respiratory distress.  He has wheezing throughout all lung fields.  No crackles heard.  Abdominal: Soft. He exhibits no distension. There is no tenderness. There is no guarding.  Musculoskeletal: He exhibits no edema.  Neurological: He is alert.  Skin: Skin is warm and dry. Capillary refill takes less than 2 seconds.  Psychiatric: He has a normal mood and affect.  Nursing note and vitals reviewed.  ED Treatments / Results  Labs (all labs ordered are listed, but only abnormal results are displayed) Labs Reviewed - No data to display  EKG None  Radiology Dg Chest 2 View  Result Date: 10/08/2017 CLINICAL DATA:  Worsening asthma for several days EXAM: CHEST - 2 VIEW COMPARISON:  06/25/2017 FINDINGS: Stable nipple shadow at the right lung base. Clear lungs. No pneumothorax or pleural effusion. Normal heart size. IMPRESSION: No active cardiopulmonary disease. Electronically Signed   By: Marybelle Killings M.D.   On: 10/08/2017 15:25    Procedures Procedures (including critical care time)  Medications Ordered in ED Medications  albuterol (PROVENTIL) (2.5 MG/3ML) 0.083% nebulizer solution 5 mg (5 mg Nebulization Given 10/08/17 1243)  ipratropium-albuterol (DUONEB) 0.5-2.5 (3) MG/3ML nebulizer solution 3 mL (3 mLs Nebulization Given 10/08/17 1350)  methylPREDNISolone sodium succinate (SOLU-MEDROL) 125 mg/2 mL injection 125 mg (125 mg Intramuscular Given 10/08/17 1350)  albuterol (PROVENTIL HFA;VENTOLIN HFA) 108 (90 Base) MCG/ACT inhaler 2 puff (2 puffs Inhalation Given 10/08/17 1350)     Initial Impression / Assessment and Plan / ED Course  I have reviewed the triage vital signs and the nursing notes.  Pertinent labs & imaging results that were available during my care of the patient were reviewed by me and considered in my medical decision making (see chart for details).    12:50 PM Checked pt. He is currently receiving a neb tx. Will recheck pt and talk with him after he finishes this. He is in no distress.   Reviewed prior records and patient has a history of COPD and was recently admitted for COPD exacerbation in April 2019.  He was told to follow-up with pulmonary to have PFTs completed.  I am not able to see where patient followed up with pulmonary in his record.  1:38 PM reevaluated patient.  He states that he feels somewhat improved after nebulizer treatment.  Still has wheezing on exam in all lung fields.  We will order an additional nebulizer treatment and steroids.  Patient received additional nebulizer treatment as well as Solu-Medrol.  Upon rechecking the patient he was not in his room.  His significant other is also in the emergency department being treated for something and he was in her room.  Nursing staff alerted the patient to come back to his room.  When patient was back in his room I reevaluated him again.  Lung sounds improved with some scant wheezing bilaterally but much improved with good airflow.  Patient feels that his breathing is back to baseline.  Discussed plan for discharge with Rx for steroid burst, antibiotics, albuterol inhaler and his steroid inhaler for suspected COPD exacerbation given increased wheezing, SOB, increased cough and sputum production. Advised him to follow-up with his PCP in regards to today's visit and to return to the ER if he has any new or worsening symptoms in the meantime.  Patient voices understanding of plan reasons to return immediately to the ED.  All questions answered.  Final Clinical Impressions(s) / ED Diagnoses   Final diagnoses:  COPD exacerbation [facility])    ED Discharge Orders         Ordered    budesonide-formoterol (SYMBICORT) 160-4.5 MCG/ACT inhaler  Daily     10/08/17 1341    predniSONE (DELTASONE) 20 MG tablet  Daily,   Status:  Discontinued     10/08/17 1341    azithromycin (ZITHROMAX Z-PAK)  250 MG tablet  Daily     10/08/17 1341    albuterol (PROVENTIL) (5 MG/ML) 0.5% nebulizer solution  Every 6 hours PRN,   Status:  Discontinued     10/08/17 1341    predniSONE (DELTASONE) 20 MG tablet  Daily     10/08/17 1345    albuterol (PROVENTIL) (5 MG/ML) 0.5% nebulizer solution  Every 6 hours PRN     10/08/17 Handley, Susan Moore, PA-C 10/08/17 [place], [place], DO 10/09/17 [phone]

## 2017-10-08 NOTE — ED Triage Notes (Addendum)
Pt c/o worsening asthma x several days. Pt states he is out of inhalers and asthma meds. Mild wheezing throughout. No distress. 95% RA.

## 2017-10-08 NOTE — Discharge Instructions (Signed)
You were given a prescription for antibiotics. Please take the antibiotic prescription fully.  Please take the prednisone and Symbicort prescriptions as advised.  You may use the nebulizer every 6 hours as needed for wheezing and shortness of breath.  You will need to follow-up with your primary care doctor in the next week for reevaluation and return to the emergency department for any new or worsening symptoms in the meantime.

## 2017-10-10 ENCOUNTER — Encounter (HOSPITAL_COMMUNITY): Payer: Self-pay

## 2017-10-10 ENCOUNTER — Emergency Department (HOSPITAL_COMMUNITY)
Admission: EM | Admit: 2017-10-10 | Discharge: 2017-10-11 | Disposition: A | Discharge status: Home / Self Care | Payer: Medicaid Other | Attending: Emergency Medicine | Admitting: Emergency Medicine

## 2017-10-10 DIAGNOSIS — Z79899 Other long term (current) drug therapy: Secondary | ICD-10-CM | POA: Insufficient documentation

## 2017-10-10 DIAGNOSIS — R0600 Dyspnea, unspecified: Secondary | ICD-10-CM | POA: Diagnosis not present

## 2017-10-10 DIAGNOSIS — R0602 Shortness of breath: Secondary | ICD-10-CM | POA: Insufficient documentation

## 2017-10-10 DIAGNOSIS — R61 Generalized hyperhidrosis: Secondary | ICD-10-CM | POA: Insufficient documentation

## 2017-10-10 DIAGNOSIS — Z7982 Long term (current) use of aspirin: Secondary | ICD-10-CM | POA: Insufficient documentation

## 2017-10-10 DIAGNOSIS — R0603 Acute respiratory distress: Secondary | ICD-10-CM | POA: Diagnosis present

## 2017-10-10 DIAGNOSIS — J441 Chronic obstructive pulmonary disease with (acute) exacerbation: Secondary | ICD-10-CM

## 2017-10-10 DIAGNOSIS — Z5320 Procedure and treatment not carried out because of patient's decision for unspecified reasons: Secondary | ICD-10-CM | POA: Insufficient documentation

## 2017-10-10 MED ORDER — ALBUTEROL (5 MG/ML) CONTINUOUS INHALATION SOLN
10.0000 mg/h | INHALATION_SOLUTION | RESPIRATORY_TRACT | Status: AC
Start: 2017-10-11 — End: 2017-10-11
  Administered 2017-10-11: 10 mg/h via RESPIRATORY_TRACT
  Filled 2017-10-10: qty 20

## 2017-10-10 NOTE — ED Provider Notes (Addendum)
[facility] DEPT Provider Note: Georgena Spurling, MD, FACEP  CSN: [id number] MRN: [id number] ARRIVAL: 10/10/17 at 2344 ROOM: RESA/RESA   CHIEF COMPLAINT  Respiratory Distress  Level 5 caveat: Respiratory distress on CPAP HISTORY OF PRESENT ILLNESS  10/10/17 11:49 PM Jack Cunningham is a 57 y.o. male with COPD who was seen here yesterday for shortness of breath.  He was treated and discharged home on prescriptions which he did not fill.  He did well until about 10:00 this morning at which time his breathing worsen.  It deteriorated throughout the day and his dyspnea became severe this evening.  EMS reports they found him to be in respiratory distress with diaphoresis and tripoding on their arrival.  He had significantly diminished breath sounds.  Prior to arrival he was given a total of 15 mg of nebulized albuterol, 1 mg of nebulized Atrovent, 125 mg of IV Solu-Medrol, 2 g of IV magnesium sulfate and 0.3 mg of intramuscular epinephrine.  He was placed on CPAP and arrives in improved condition.  He continues to have wheezing but his work of breathing has improved.  He has not had chest pain with this.   Past Medical History:  Diagnosis Date  . Asthma   . COPD (chronic obstructive pulmonary disease) ([place])     History reviewed. No pertinent surgical history.  History reviewed. No pertinent family history.  Social History   Tobacco Use  . Smoking status: Never Smoker  . Smokeless tobacco: Never Used  Substance Use Topics  . Alcohol use: No  . Drug use: No    Prior to Admission medications   Medication Sig Start Date End Date Taking? Authorizing Provider  albuterol (PROVENTIL) (5 MG/ML) 0.5% nebulizer solution Take 0.5 mLs (2.5 mg total) by nebulization every 6 (six) hours as needed for wheezing or shortness of breath. 10/08/17   Couture, Cortni S, PA-C  aspirin 325 MG tablet Take 325 mg by mouth daily.    [provider]  azithromycin (ZITHROMAX Z-PAK) 250 MG tablet Take 1  tablet (250 mg total) by mouth daily. Take 2 tablets on the first day of treatment. Then take 1 tablet per day for the next four days. 10/08/17   Couture, Cortni S, PA-C  budesonide-formoterol (SYMBICORT) 160-4.5 MCG/ACT inhaler Inhale 2 puffs into the lungs daily. 10/08/17   Couture, Cortni S, PA-C  guaiFENesin (MUCINEX) 600 MG 12 hr tablet Take 2 tablets (1,200 mg total) by mouth 2 (two) times daily. 06/25/17   Raiford Noble Latif, DO  Multiple Vitamin (MULTIVITAMIN WITH MINERALS) TABS tablet Take 1 tablet by mouth daily.    [provider]  predniSONE (DELTASONE) 20 MG tablet Take 3 tablets (60 mg total) by mouth daily for 5 days. 10/08/17 10/13/17  Couture, Cortni S, PA-C  tiotropium (SPIRIVA HANDIHALER) 18 MCG inhalation capsule Place 1 capsule (18 mcg total) into inhaler and inhale daily. 06/25/17 06/25/18  Kerney Elbe, DO    Allergies Patient has no known allergies.   REVIEW OF SYSTEMS     PHYSICAL EXAMINATION  Initial Vital Signs Blood pressure (!) 146/87, pulse 77, temperature (!) 96.8 F (36 C), temperature source Axillary, resp. rate (!) 28, height 5\' 10"  (1.778 m), weight 97.5 kg, SpO2 100 %.  Examination General: Well-developed, well-nourished male; appearance consistent with age of record HENT: normocephalic; atraumatic Eyes: pupils equal, round and reactive to light; extraocular muscles grossly intact Neck: supple Heart: regular rate and rhythm Lungs: On CPAP; tachypnea; inspiratory and expiratory wheezes Abdomen: soft; nondistended;  nontender; bowel sounds present Extremities: No deformity; full range of motion; pulses normal; trace edema of lower legs Neurologic: Awake, alert; motor function intact in all extremities and symmetric; no facial droop Skin: Warm and dry Psychiatric: Flat affect   RESULTS  Summary of this visit's results, reviewed by myself:   EKG Interpretation  Date/Time:  Sunday October 10 2017 23:51:17 EDT Ventricular Rate:  77 PR  Interval:    QRS Duration: 89 QT Interval:  374 QTC Calculation: 424 R Axis:   43 Text Interpretation:  Sinus rhythm Probable left atrial enlargement Abnormal R-wave progression, early transition T-wave changes no longer present Confirmed by Labrea Eccleston, Jenny Reichmann [phone]) on 10/10/2017 11:55:09 PM      Laboratory Studies: Results for orders placed or performed during the hospital encounter of 10/10/17 (from the past 24 hour(s))  CBC with Differential/Platelet     Status: Abnormal   Collection Time: 10/10/17 11:50 PM  Result Value Ref Range   WBC 12.3 (H) 4.0 - 10.5 K/uL   RBC 5.06 4.22 - 5.81 MIL/uL   Hemoglobin 14.9 13.0 - 17.0 g/dL   HCT 45.7 39.0 - 52.0 %   MCV 90.3 78.0 - 100.0 fL   MCH 29.4 26.0 - 34.0 pg   MCHC 32.6 30.0 - 36.0 g/dL   RDW 12.5 11.5 - 15.5 %   Platelets 275 150 - 400 K/uL   Neutrophils Relative % 66 %   Neutro Abs 8.2 (H) 1.7 - 7.7 K/uL   Lymphocytes Relative 19 %   Lymphs Abs 2.3 0.7 - 4.0 K/uL   Monocytes Relative 9 %   Monocytes Absolute 1.1 (H) 0.1 - 1.0 K/uL   Eosinophils Relative 5 %   Eosinophils Absolute 0.6 0.0 - 0.7 K/uL   Basophils Relative 1 %   Basophils Absolute 0.1 0.0 - 0.1 K/uL  Basic metabolic panel     Status: Abnormal   Collection Time: 10/10/17 11:50 PM  Result Value Ref Range   Sodium 143 135 - 145 mmol/L   Potassium 4.2 3.5 - 5.1 mmol/L   Chloride 105 98 - 111 mmol/L   CO2 29 22 - 32 mmol/L   Glucose, Bld 128 (H) 70 - 99 mg/dL   BUN 19 6 - 20 mg/dL   Creatinine, Ser 0.85 0.61 - 1.24 mg/dL   Calcium 9.1 8.9 - 10.3 mg/dL   GFR calc non Af Amer >60 >60 mL/min   GFR calc Af Amer >60 >60 mL/min   Anion gap 9 5 - 15  Blood gas, venous     Status: Abnormal   Collection Time: 10/10/17 11:50 PM  Result Value Ref Range   FIO2 50.00    Delivery systems BILEVEL POSITIVE AIRWAY PRESSURE    Mode STICKY    LHR 10 resp/min   Peep/cpap 5.0 cm H20   pH, Ven 7.382 7.250 - 7.430   pCO2, Ven 48.4 44.0 - 60.0 mmHg   pO2, Ven 76.6 (H) 32.0 - 45.0  mmHg   Bicarbonate 28.1 (H) 20.0 - 28.0 mmol/L   Acid-Base Excess 2.7 (H) 0.0 - 2.0 mmol/L   O2 Saturation 95.3 %   Patient temperature 98.6    Collection site VEIN    Drawn by DRAWN BY RN    Sample type VEIN   Troponin I     Status: Abnormal   Collection Time: 10/10/17 11:50 PM  Result Value Ref Range   Troponin I 0.03 (HH) <0.03 ng/mL   Imaging Studies: Dg Chest Port 1 [address]  Result Date: 10/11/2017 CLINICAL DATA:  Shortness of breath EXAM: PORTABLE CHEST 1 VIEW COMPARISON:  10/08/2017 FINDINGS: The heart size and mediastinal contours are within normal limits. Both lungs are clear. The visualized skeletal structures are unremarkable. IMPRESSION: No active disease. Electronically Signed   By: Inez Catalina M.D.   On: 10/11/2017 00:29    ED COURSE and MDM  Nursing notes and initial vitals signs, including pulse oximetry, reviewed.  Vitals:   10/11/17 0030 10/11/17 0100 10/11/17 0130 10/11/17 0200  BP: 121/79 (!) 154/83 120/62 (!) 146/72  Pulse: 65 86 71 93  Resp: 19 19 (!) 7 13  Temp:      TempSrc:      SpO2: 100% 100% 100% 97%  Weight:      Height:       11:57 PM Patient placed on BiPAP on arrival by respiratory therapy.  1 hour continuous albuterol treatment ordered.   2:54 AM Patient resting comfortably in no respiratory distress.  He is able to ambulate without becoming dyspneic.  He states he feels better than he did when he last yesterday.  He was told that admission was advised but he would prefer to go home.  We will have him sign out [place].  He was advised he is free to return at any time should he be worsen or change his mind.  He states he will get his prescriptions filled later this morning.  PROCEDURES   CRITICAL CARE Performed by: Shanon Rosser L Total critical care time: 30 minutes Critical care time was exclusive of separately billable procedures and treating other patients. Critical care was necessary to treat or prevent imminent or  life-threatening deterioration. Critical care was time spent personally by me on the following activities: development of treatment plan with patient and/or surrogate as well as nursing, discussions with consultants, evaluation of patient's response to treatment, examination of patient, obtaining history from patient or surrogate, ordering and performing treatments and interventions, ordering and review of laboratory studies, ordering and review of radiographic studies, pulse oximetry and re-evaluation of patient's condition.   ED DIAGNOSES     ICD-10-CM   1. COPD exacerbation ([place]) J44.1        Donyell Ding, MD 10/11/17 0255    Shanon Rosser, MD 10/11/17 [phone]

## 2017-10-10 NOTE — ED Notes (Signed)
Bed: RESA Expected date:  Expected time:  Means of arrival:  Comments: 57 yo M/Shortness of breath

## 2017-10-10 NOTE — ED Triage Notes (Signed)
Pt arrived by EMS from home. Pt was seen at this facility last night for same complaint. Pt stated that since 1000 this morning he continued to feel short of breath and required breathing treatments Q1. Pt received 125mg  of Solumedrol, 15 of Albuterol, 1 Atrovent, 2G Magnesium, and 0.3 IM of Epi. Pt arrived on bi-pap.

## 2017-10-11 ENCOUNTER — Emergency Department (HOSPITAL_COMMUNITY): Payer: Medicaid Other

## 2017-10-11 ENCOUNTER — Other Ambulatory Visit: Payer: Self-pay

## 2017-10-11 LAB — BASIC METABOLIC PANEL
ANION GAP: 9 (ref 5–15)
BUN: 19 mg/dL (ref 6–20)
CO2: 29 mmol/L (ref 22–32)
Calcium: 9.1 mg/dL (ref 8.9–10.3)
Chloride: 105 mmol/L (ref 98–111)
Creatinine, Ser: 0.85 mg/dL (ref 0.61–1.24)
GFR calc Af Amer: >60 mL/min (ref >60)
GFR calc non Af Amer: >60 mL/min (ref >60)
Glucose, Bld: 128 mg/dL — ABNORMAL HIGH (ref 70–99)
Potassium: 4.2 mmol/L (ref 3.5–5.1)
SODIUM: 143 mmol/L (ref 135–145)

## 2017-10-11 LAB — CBC WITH DIFFERENTIAL/PLATELET
BASOS ABS: 0.1 10*3/uL (ref 0.0–0.1)
BASOS PCT: 1 %
EOS ABS: 0.6 10*3/uL (ref 0.0–0.7)
Eosinophils Relative: 5 %
HEMATOCRIT: 45.7 % (ref 39.0–52.0)
HEMOGLOBIN: 14.9 g/dL (ref 13.0–17.0)
Lymphocytes Relative: 19 %
Lymphs Abs: 2.3 10*3/uL (ref 0.7–4.0)
MCH: 29.4 pg (ref 26.0–34.0)
MCHC: 32.6 g/dL (ref 30.0–36.0)
MCV: 90.3 fL (ref 78.0–100.0)
MONO ABS: 1.1 10*3/uL — AB (ref 0.1–1.0)
MONOS PCT: 9 %
NEUTROS PCT: 66 %
Neutro Abs: 8.2 10*3/uL — ABNORMAL HIGH (ref 1.7–7.7)
Platelets: 275 10*3/uL (ref 150–400)
RBC: 5.06 MIL/uL (ref 4.22–5.81)
RDW: 12.5 % (ref 11.5–15.5)
WBC: 12.3 10*3/uL — ABNORMAL HIGH (ref 4.0–10.5)

## 2017-10-11 LAB — BLOOD GAS, VENOUS
Acid-Base Excess: 2.7 mmol/L — ABNORMAL HIGH (ref 0.0–2.0)
Bicarbonate: 28.1 mmol/L — ABNORMAL HIGH (ref 20.0–28.0)
Delivery systems: POSITIVE
FIO2: 50
LHR: 10 {breaths}/min
O2 SAT: 95.3 %
PEEP/CPAP: 5 cmH2O
PH VEN: 7.382 (ref 7.250–7.430)
PO2 VEN: 76.6 mmHg — AB (ref 32.0–45.0)
Patient temperature: 98.6
pCO2, Ven: 48.4 mmHg (ref 44.0–60.0)

## 2017-10-11 LAB — TROPONIN I: Troponin I: 0.03 ng/mL (ref <0.03)

## 2017-10-11 NOTE — ED Notes (Signed)
X-ray at bedside

## 2017-10-11 NOTE — ED Notes (Signed)
Ambulated Pt down hallway. Pt's oxygen saturation stayed at 95% in room air. Notified Dr. Florina Ou

## 2017-10-11 NOTE — ED Notes (Signed)
Pt informed the importance of staying to be admitted. Pt refused and stated "I'm feeling much better and want to go home" Went over follow up and returning to ER instructions with Pt. Pt verbalized understanding. Went over getting prescriptions with Pt. Pt's vitals stable on departure and Pt waiting on wife to take him home.

## 2017-10-11 NOTE — Progress Notes (Signed)
Pt. Removed from BIPAP at this time and placed on [place] per Dr. Florina Ou.  Will monitor for tolerance/signs of distress.  Pt. Stating that he feels better at this time.

## 2018-04-24 ENCOUNTER — Emergency Department (HOSPITAL_COMMUNITY): Payer: Medicaid Other

## 2018-04-24 ENCOUNTER — Encounter (HOSPITAL_COMMUNITY): Payer: Self-pay | Admitting: Emergency Medicine

## 2018-04-24 ENCOUNTER — Emergency Department (HOSPITAL_COMMUNITY)
Admission: EM | Admit: 2018-04-24 | Discharge: 2018-04-24 | Disposition: A | Discharge status: Home / Self Care | Payer: Medicaid Other | Attending: Emergency Medicine | Admitting: Emergency Medicine

## 2018-04-24 ENCOUNTER — Other Ambulatory Visit: Payer: Self-pay

## 2018-04-24 DIAGNOSIS — J45909 Unspecified asthma, uncomplicated: Secondary | ICD-10-CM | POA: Insufficient documentation

## 2018-04-24 DIAGNOSIS — M25461 Effusion, right knee: Secondary | ICD-10-CM | POA: Diagnosis not present

## 2018-04-24 DIAGNOSIS — Z79899 Other long term (current) drug therapy: Secondary | ICD-10-CM | POA: Diagnosis not present

## 2018-04-24 DIAGNOSIS — M25561 Pain in right knee: Secondary | ICD-10-CM | POA: Diagnosis present

## 2018-04-24 MED ORDER — NAPROXEN 500 MG PO TABS: 500.0000 mg | ORAL_TABLET | Freq: Two times a day (BID) | ORAL | 0 refills | Status: DC

## 2018-04-24 MED ORDER — PREDNISONE 10 MG PO TABS: 40.0000 mg | ORAL_TABLET | Freq: Every day | ORAL | 0 refills | Status: DC

## 2018-04-24 NOTE — Discharge Instructions (Addendum)
Follow up with the orthopedic doctor. Return here as needed.

## 2018-04-24 NOTE — ED Provider Notes (Signed)
[facility] DEPT Provider Note   CSN: [id number] Arrival date & time: 04/24/18  1144    History   Chief Complaint Chief Complaint  Patient presents with  . Knee Pain    HPI Jack Cunningham is a 58 y.o. male who presents to the ED with right knee pain and swelling. Patient denies any injury to the area.      The history is provided by the patient. No language interpreter was used.  Knee Pain  Location:  Knee Injury: no   Knee location:  R knee Pain details:    Quality:  Throbbing   Radiates to:  Does not radiate   Severity:  Moderate   Onset quality:  Gradual   Duration:  4 days   Timing:  Constant   Progression:  Worsening Chronicity:  New Dislocation: no   Foreign body present:  No foreign bodies Prior injury to area:  No Relieved by:  Nothing Worsened by:  Bearing weight and activity Ineffective treatments: ASA. Associated symptoms: swelling   Associated symptoms: no fever     Past Medical History:  Diagnosis Date  . Asthma   . COPD (chronic obstructive pulmonary disease) [facility])     Patient Active Problem List   Diagnosis Date Noted  . COPD (chronic obstructive pulmonary disease) ([place]) 06/24/2017  . COPD exacerbation ([place]) 06/23/2017    History reviewed. No pertinent surgical history.      Home Medications    Prior to Admission medications   Medication Sig Start Date End Date Taking? Authorizing Provider  albuterol (PROVENTIL) (5 MG/ML) 0.5% nebulizer solution Take 0.5 mLs (2.5 mg total) by nebulization every 6 (six) hours as needed for wheezing or shortness of breath. 10/08/17   Couture, Cortni S, PA-C  aspirin 325 MG tablet Take 325 mg by mouth daily.    [provider]  budesonide-formoterol (SYMBICORT) 160-4.5 MCG/ACT inhaler Inhale 2 puffs into the lungs daily. 10/08/17   Couture, Cortni S, PA-C  guaiFENesin (MUCINEX) 600 MG 12 hr tablet Take 2 tablets (1,200 mg total) by mouth 2 (two) times daily. 06/25/17    Raiford Noble Latif, DO  Multiple Vitamin (MULTIVITAMIN WITH MINERALS) TABS tablet Take 1 tablet by mouth daily.    [provider]  naproxen (NAPROSYN) 500 MG tablet Take 1 tablet (500 mg total) by mouth 2 (two) times daily. 04/24/18   Ashley Murrain, NP  predniSONE (DELTASONE) 10 MG tablet Take 4 tablets (40 mg total) by mouth daily with breakfast. 04/24/18   Ashley Murrain, NP  tiotropium (SPIRIVA HANDIHALER) 18 MCG inhalation capsule Place 1 capsule (18 mcg total) into inhaler and inhale daily. 06/25/17 06/25/18  Kerney Elbe, DO    Family History No family history on file.  Social History Social History   Tobacco Use  . Smoking status: Never Smoker  . Smokeless tobacco: Never Used  Substance Use Topics  . Alcohol use: No  . Drug use: No     Allergies   Patient has no known allergies.   Review of Systems Review of Systems  Constitutional: Negative for chills and fever.  HENT: Negative.   Eyes: Negative for visual disturbance.  Respiratory: Negative for cough and shortness of breath.   Cardiovascular: Negative for chest pain.  Gastrointestinal: Negative for abdominal pain, diarrhea, nausea and vomiting.  Genitourinary: Negative for dysuria, frequency and urgency.  Musculoskeletal: Positive for arthralgias.       Right knee  Skin: Negative for rash and wound.  Neurological: Negative for headaches.  Psychiatric/Behavioral: Negative for confusion.     Physical Exam Updated Vital Signs BP (!) 145/74   Pulse 70   Temp 98.3 F (36.8 C) (Oral)   Resp 18   SpO2 100%   Physical Exam Vitals signs and nursing note reviewed.  Constitutional:      General: He is not in acute distress.    Appearance: He is well-developed.  HENT:     Head: Normocephalic.     Mouth/Throat:     Mouth: Mucous membranes are moist.  Neck:     Musculoskeletal: Neck supple.  Cardiovascular:     Rate and Rhythm: Normal rate.  Pulmonary:     Effort: Pulmonary effort is normal.    Abdominal:     Palpations: Abdomen is soft.     Tenderness: There is no abdominal tenderness.  Musculoskeletal:     Right knee: He exhibits swelling. He exhibits no deformity, no erythema and normal alignment. Decreased range of motion: due to pain. Tenderness found.       Legs:     Comments: Tenderness and swelling of the anterior right knee. Pedal pulse 2+. No erythema, slightly increased warmth.   Skin:    General: Skin is warm and dry.  Neurological:     Mental Status: He is alert and oriented to person, place, and time.  Psychiatric:        Mood and Affect: Mood normal.      ED Treatments / Results  Labs (all labs ordered are listed, but only abnormal results are displayed) Labs Reviewed - No data to display  Radiology No results found.  Procedures Procedures (including critical care time)  Medications Ordered in ED Medications - No data to display   Initial Impression / Assessment and Plan / ED Course  I have reviewed the triage vital signs and the nursing notes. 58 y.o. male here with right knee pain and swelling stable for d/c without focal neuro deficits. Knee sleeve, short course of prednisone and naprosyn. Patient to f/u with PCP. Return precautions discussed.   Final Clinical Impressions(s) / ED Diagnoses   Final diagnoses:  Pain and swelling of knee, right    ED Discharge Orders         Ordered    naproxen (NAPROSYN) 500 MG tablet  2 times daily,   Status:  Discontinued     04/24/18 1600    predniSONE (DELTASONE) 10 MG tablet  Daily with breakfast,   Status:  Discontinued     04/24/18 1600    naproxen (NAPROSYN) 500 MG tablet  2 times daily     04/24/18 1608    predniSONE (DELTASONE) 10 MG tablet  Daily with breakfast     04/24/18 [place], [place], NP 04/26/18 2149    Carmin Muskrat, MD 04/28/18 2133

## 2018-04-24 NOTE — ED Notes (Signed)
Ortho tech called 

## 2018-04-24 NOTE — ED Notes (Signed)
Pt left without evaluation from NP / MD.

## 2018-04-24 NOTE — ED Triage Notes (Signed)
Pt c/o recent R knee swelling, denies hx of R knee injury.

## 2018-04-27 ENCOUNTER — Other Ambulatory Visit: Payer: Self-pay

## 2018-04-27 ENCOUNTER — Emergency Department (HOSPITAL_COMMUNITY): Payer: Medicaid Other

## 2018-04-27 ENCOUNTER — Emergency Department (HOSPITAL_COMMUNITY)
Admission: EM | Admit: 2018-04-27 | Discharge: 2018-04-27 | Disposition: A | Discharge status: Home / Self Care | Payer: Medicaid Other | Attending: Emergency Medicine | Admitting: Emergency Medicine

## 2018-04-27 ENCOUNTER — Encounter (HOSPITAL_COMMUNITY): Payer: Self-pay

## 2018-04-27 DIAGNOSIS — R0602 Shortness of breath: Secondary | ICD-10-CM | POA: Insufficient documentation

## 2018-04-27 DIAGNOSIS — R0989 Other specified symptoms and signs involving the circulatory and respiratory systems: Secondary | ICD-10-CM | POA: Diagnosis present

## 2018-04-27 DIAGNOSIS — R062 Wheezing: Secondary | ICD-10-CM | POA: Insufficient documentation

## 2018-04-27 DIAGNOSIS — T7840XA Allergy, unspecified, initial encounter: Secondary | ICD-10-CM | POA: Diagnosis not present

## 2018-04-27 DIAGNOSIS — J449 Chronic obstructive pulmonary disease, unspecified: Secondary | ICD-10-CM | POA: Diagnosis not present

## 2018-04-27 DIAGNOSIS — Z7982 Long term (current) use of aspirin: Secondary | ICD-10-CM | POA: Diagnosis not present

## 2018-04-27 DIAGNOSIS — Z79899 Other long term (current) drug therapy: Secondary | ICD-10-CM | POA: Diagnosis not present

## 2018-04-27 HISTORY — DX: Sleep apnea, unspecified: G47.30

## 2018-04-27 LAB — CBC WITH DIFFERENTIAL/PLATELET
Abs Immature Granulocytes: 0.22 10*3/uL — ABNORMAL HIGH (ref 0.00–0.07)
BASOS ABS: 0.1 10*3/uL (ref 0.0–0.1)
Basophils Relative: 1 %
Eosinophils Absolute: 0.1 10*3/uL (ref 0.0–0.5)
Eosinophils Relative: 1 %
HCT: 46.7 % (ref 39.0–52.0)
Hemoglobin: 14.3 g/dL (ref 13.0–17.0)
Immature Granulocytes: 2 %
Lymphocytes Relative: 14 %
Lymphs Abs: 1.8 10*3/uL (ref 0.7–4.0)
MCH: 28.7 pg (ref 26.0–34.0)
MCHC: 30.6 g/dL (ref 30.0–36.0)
MCV: 93.6 fL (ref 80.0–100.0)
Monocytes Absolute: 1.2 10*3/uL — ABNORMAL HIGH (ref 0.1–1.0)
Monocytes Relative: 9 %
NEUTROS ABS: 9.6 10*3/uL — AB (ref 1.7–7.7)
NRBC: 0 % (ref 0.0–0.2)
Neutrophils Relative %: 73 %
PLATELETS: 207 10*3/uL (ref 150–400)
RBC: 4.99 MIL/uL (ref 4.22–5.81)
RDW: 12.6 % (ref 11.5–15.5)
WBC: 12.9 10*3/uL — ABNORMAL HIGH (ref 4.0–10.5)

## 2018-04-27 LAB — BASIC METABOLIC PANEL
Anion gap: 6 (ref 5–15)
BUN: 22 mg/dL — ABNORMAL HIGH (ref 6–20)
CO2: 31 mmol/L (ref 22–32)
Calcium: 9.1 mg/dL (ref 8.9–10.3)
Chloride: 103 mmol/L (ref 98–111)
Creatinine, Ser: 0.98 mg/dL (ref 0.61–1.24)
Glucose, Bld: 96 mg/dL (ref 70–99)
Potassium: 4.3 mmol/L (ref 3.5–5.1)
Sodium: 140 mmol/L (ref 135–145)

## 2018-04-27 LAB — TROPONIN I
TROPONIN I: 0.03 ng/mL — AB (ref <0.03)
Troponin I: 0.04 ng/mL (ref <0.03)
Troponin I: 0.04 ng/mL (ref <0.03)

## 2018-04-27 LAB — D-DIMER, QUANTITATIVE: D-Dimer, Quant: <0.27 ug/mL-FEU (ref 0.00–0.50)

## 2018-04-27 MED ORDER — ALBUTEROL SULFATE (2.5 MG/3ML) 0.083% IN NEBU
5.0000 mg | INHALATION_SOLUTION | Freq: Once | RESPIRATORY_TRACT | Status: AC
  Administered 2018-04-27: 5 mg via RESPIRATORY_TRACT
  Filled 2018-04-27: qty 6

## 2018-04-27 MED ORDER — SODIUM CHLORIDE 0.9 % IV BOLUS
1000.0000 mL | Freq: Once | INTRAVENOUS | Status: AC
  Administered 2018-04-27: 1000 mL via INTRAVENOUS

## 2018-04-27 MED ORDER — CETIRIZINE HCL 10 MG PO TABS: 10.0000 mg | ORAL_TABLET | Freq: Every day | ORAL | 0 refills | Status: AC

## 2018-04-27 MED ORDER — FAMOTIDINE 20 MG PO TABS: 20.0000 mg | ORAL_TABLET | Freq: Two times a day (BID) | ORAL | 0 refills | Status: DC

## 2018-04-27 MED ORDER — DIPHENHYDRAMINE HCL 50 MG/ML IJ SOLN
25.0000 mg | Freq: Once | INTRAMUSCULAR | Status: AC
Start: 2018-04-27 — End: 2018-04-27
  Administered 2018-04-27: 25 mg via INTRAVENOUS
  Filled 2018-04-27: qty 1

## 2018-04-27 MED ORDER — FAMOTIDINE IN NACL 20-0.9 MG/50ML-% IV SOLN
20.0000 mg | Freq: Once | INTRAVENOUS | Status: AC
Start: 2018-04-27 — End: 2018-04-27
  Administered 2018-04-27: 20 mg via INTRAVENOUS
  Filled 2018-04-27: qty 50

## 2018-04-27 MED ORDER — PREDNISONE 20 MG PO TABS: 40.0000 mg | ORAL_TABLET | Freq: Every day | ORAL | 0 refills | Status: AC

## 2018-04-27 MED ORDER — METHYLPREDNISOLONE SODIUM SUCC 125 MG IJ SOLR
125.0000 mg | Freq: Once | INTRAMUSCULAR | Status: AC
Start: 2018-04-27 — End: 2018-04-27
  Administered 2018-04-27: 125 mg via INTRAVENOUS
  Filled 2018-04-27: qty 2

## 2018-04-27 MED ORDER — IPRATROPIUM BROMIDE 0.02 % IN SOLN
0.5000 mg | Freq: Once | RESPIRATORY_TRACT | Status: AC
  Administered 2018-04-27: 0.5 mg via RESPIRATORY_TRACT
  Filled 2018-04-27: qty 2.5

## 2018-04-27 MED ORDER — ASPIRIN 81 MG PO CHEW
324.0000 mg | CHEWABLE_TABLET | Freq: Once | ORAL | Status: AC
  Administered 2018-04-27: 324 mg via ORAL
  Filled 2018-04-27: qty 4

## 2018-04-27 NOTE — ED Notes (Signed)
Date and time results received: 04/27/18 2031 (use smartphrase ".now" to insert current time)  Test: troponin  Critical Value: 0.04  Name of Provider Notified: Langston Masker   Orders Received? Or Actions Taken?: Actions Taken: notified Langston Masker of troponin 0.04

## 2018-04-27 NOTE — ED Notes (Signed)
Date and time results received: 04/27/18 1528 (use smartphrase ".now" to insert current time)  Test: troponin Critical Value: 0.03  Name of Provider Notified: Langston Masker  Orders Received? Or Actions Taken?: Actions Taken: reported to [facility] troponin 0.03.

## 2018-04-27 NOTE — ED Provider Notes (Signed)
[facility] DEPT Provider Note   CSN: [id number] Arrival date & time: 04/27/18  1252    History   Chief Complaint Chief Complaint  Patient presents with  . Allergic Reaction    HPI Jack Cunningham is a 58 y.o. male.     HPI  Patient is a 58 year old male with a history of asthma, COPD, and OSA presenting for sensation of "throat swelling" and shortness of breath.  Patient ports that he was at the dentist today and received an injection in a dental block in preparation for dental procedure when he began feeling a lot of mucus in the back of his throat, and the urge to cough.  Patient reports he had wheezing, but he has wheezing at baseline due to his COPD and asthma.  Patient reports that he felt "dizzy", got up to go to the bathroom to rinse out his mouth, and was "spitting up blood" and having a lot of mucus production.  Patient denies any itching in the skin, abdominal cramping, palpitations, chest pain.  He reports that his symptoms are mostly improved on arrival.  Past Medical History:  Diagnosis Date  . Asthma   . COPD (chronic obstructive pulmonary disease) ([place])   . Sleep apnea     Patient Active Problem List   Diagnosis Date Noted  . COPD (chronic obstructive pulmonary disease) ([place]) 06/24/2017  . COPD exacerbation ([place]) 06/23/2017    History reviewed. No pertinent surgical history.      Home Medications    Prior to Admission medications   Medication Sig Start Date End Date Taking? Authorizing Provider  albuterol (PROVENTIL) (5 MG/ML) 0.5% nebulizer solution Take 0.5 mLs (2.5 mg total) by nebulization every 6 (six) hours as needed for wheezing or shortness of breath. 10/08/17   Couture, Cortni S, PA-C  aspirin 325 MG tablet Take 325 mg by mouth daily.    [provider]  budesonide-formoterol (SYMBICORT) 160-4.5 MCG/ACT inhaler Inhale 2 puffs into the lungs daily. 10/08/17   Couture, Cortni S, PA-C  guaiFENesin (MUCINEX) 600  MG 12 hr tablet Take 2 tablets (1,200 mg total) by mouth 2 (two) times daily. 06/25/17   Raiford Noble Latif, DO  Multiple Vitamin (MULTIVITAMIN WITH MINERALS) TABS tablet Take 1 tablet by mouth daily.    [provider]  naproxen (NAPROSYN) 500 MG tablet Take 1 tablet (500 mg total) by mouth 2 (two) times daily. 04/24/18   Ashley Murrain, NP  predniSONE (DELTASONE) 10 MG tablet Take 4 tablets (40 mg total) by mouth daily with breakfast. 04/24/18   Ashley Murrain, NP  PROVENTIL HFA 108 [phone] Base) MCG/ACT inhaler Inhale 1 puff into the lungs every 4 (four) hours as needed for shortness of breath. 01/02/18   [provider]  tiotropium (SPIRIVA HANDIHALER) 18 MCG inhalation capsule Place 1 capsule (18 mcg total) into inhaler and inhale daily. 06/25/17 06/25/18  Kerney Elbe, DO    Family History Family History  Problem Relation Age of Onset  . Diabetes Mother   . Cancer Father     Social History Social History   Tobacco Use  . Smoking status: Never Smoker  . Smokeless tobacco: Never Used  Substance Use Topics  . Alcohol use: No  . Drug use: No     Allergies   Patient has no known allergies.   Review of Systems Review of Systems  Constitutional: Negative for chills and fever.  HENT: Positive for rhinorrhea and sore throat. Negative for congestion  and sinus pain.   Eyes: Negative for visual disturbance.  Respiratory: Positive for wheezing. Negative for cough, chest tightness and shortness of breath.   Cardiovascular: Negative for chest pain and palpitations.  Gastrointestinal: Negative for abdominal pain, diarrhea, nausea and vomiting.  Genitourinary: Negative for dysuria and flank pain.  Musculoskeletal: Negative for back pain and myalgias.  Skin: Negative for rash.  Neurological: Negative for dizziness and syncope.     Physical Exam Updated Vital Signs BP (!) 161/85 (BP Location: Right Arm)   Pulse 88   Temp 97.8 F (36.6 C) (Oral)   Resp 18   Ht 5'  10" (1.778 m)   Wt 93 kg   SpO2 100%   BMI 29.41 kg/m   Physical Exam Vitals signs and nursing note reviewed.  Constitutional:      General: He is not in acute distress.    Appearance: He is well-developed.  HENT:     Head: Normocephalic and atraumatic.     Mouth/Throat:     Comments: Patient has crowded posterior pharynx.  No edema or erythema posterior pharynx.  No uvula edema.  Normal phonation.  No lingual swelling.  No facial swelling or angioedema. Eyes:     General:        Right eye: No discharge.        Left eye: No discharge.     Extraocular Movements: Extraocular movements intact.     Conjunctiva/sclera: Conjunctivae normal.     Pupils: Pupils are equal, round, and reactive to light.  Neck:     Musculoskeletal: Normal range of motion and neck supple.  Cardiovascular:     Rate and Rhythm: Normal rate and regular rhythm.     Heart sounds: S1 normal and S2 normal. No murmur.  Pulmonary:     Effort: Pulmonary effort is normal.     Breath sounds: Wheezing present. No rales.     Comments: End expiratory wheezes present in bilateral lower lung fields. Abdominal:     General: There is no distension.     Palpations: Abdomen is soft.     Tenderness: There is no abdominal tenderness. There is no guarding.  Musculoskeletal: Normal range of motion.        General: No deformity.  Lymphadenopathy:     Cervical: No cervical adenopathy.  Skin:    General: Skin is warm and dry.     Findings: No erythema or rash.  Neurological:     Mental Status: He is alert.     Comments: Cranial nerves grossly intact. Patient moves extremities symmetrically and with good coordination.  Psychiatric:        Behavior: Behavior normal.        Thought Content: Thought content normal.        Judgment: Judgment normal.      ED Treatments / Results  Labs (all labs ordered are listed, but only abnormal results are displayed) Labs Reviewed  CBC WITH DIFFERENTIAL/PLATELET - Abnormal; Notable  for the following components:      Result Value   WBC 12.9 (*)    Neutro Abs 9.6 (*)    Monocytes Absolute 1.2 (*)    Abs Immature Granulocytes 0.22 (*)    All other components within normal limits  BASIC METABOLIC PANEL  TROPONIN I    EKG EKG Interpretation  Date/Time:  Wednesday April 27 2018 13:29:48 EST Ventricular Rate:  75 PR Interval:    QRS Duration: 69 QT Interval:  387 QTC Calculation: 433 R Axis:  26 Text Interpretation:  Sinus rhythm RSR' in V1 or V2, probably normal variant Nonspecific T abnormalities, lateral leads Minimal ST elevation, anterior leads Baseline wander in lead(s) aVL Confirmed by Milton Ferguson [phone]) on 04/27/2018 3:30:41 PM Also confirmed by Milton Ferguson [phone])  on 04/27/2018 3:38:01 PM   Radiology Dg Chest 2 View  Result Date: 04/27/2018 CLINICAL DATA:  Shortness of breath, chest pain, reaction to lidocaine at dentist office EXAM: CHEST - 2 VIEW COMPARISON:  10/10/2017 FINDINGS: Normal heart size, mediastinal contours, and pulmonary vascularity. Lungs clear. No infiltrate, pleural effusion or pneumothorax. Bones unremarkable. IMPRESSION: No acute abnormalities. Electronically Signed   By: Lavonia Dana M.D.   On: 04/27/2018 16:57    Procedures Procedures (including critical care time)  Medications Ordered in ED Medications  albuterol (PROVENTIL) (2.5 MG/3ML) 0.083% nebulizer solution 5 mg (5 mg Nebulization Given 04/27/18 1321)  diphenhydrAMINE (BENADRYL) injection 25 mg (25 mg Intravenous Given 04/27/18 1437)  methylPREDNISolone sodium succinate (SOLU-MEDROL) 125 mg/2 mL injection 125 mg (125 mg Intravenous Given 04/27/18 1437)  famotidine (PEPCID) IVPB 20 mg premix (20 mg Intravenous New Bag/Given 04/27/18 1438)  albuterol (PROVENTIL) (2.5 MG/3ML) 0.083% nebulizer solution 5 mg (5 mg Nebulization Given 04/27/18 1421)  ipratropium (ATROVENT) nebulizer solution 0.5 mg (0.5 mg Nebulization Given 04/27/18 1421)  sodium chloride 0.9 % bolus 1,000 mL  (1,000 mLs Intravenous New Bag/Given 04/27/18 1510)     Initial Impression / Assessment and Plan / ED Course  I have reviewed the triage vital signs and the nursing notes.  Pertinent labs & imaging results that were available during my care of the patient were reviewed by me and considered in my medical decision making (see chart for details).  Clinical Course as of Apr 28 2111  Wed Apr 27, 2018  1338 Patient assessed at bedside with Attending Dr. Milton Ferguson and speaking in full sentences, not meeting criteria for anaphylaxis requiring epi at this time.    [AM]  1417 On reassessment, patient has much improved breathing.  He reports his breathing is nearly back to baseline.  Repeat pulmonary exam shows no wheezing.   [AM]  1538 Received call from lab regarding troponin elevation. Pt has had troponin elevation previously.  Patient reassessed and is feeling better with no active chest pain.  Reassessed again by myself and Dr. Milton Ferguson.  Will plan for delta troponin.  If stable and nonelevated, and patient has a nonelevated d-dimer, patient stable for outpatient follow with cardiology.  If patient has elevation in troponin, will admit.  If patient has elevated d-dimer, will perform CT angiogram.   [AM]  1741 PE unlikely cause of troponin elevation.   D-Dimer, Quant: <0.27 [AM]  2030 Received call from lab that troponin is stable at 0.04.   [AM]    Clinical Course User Index [AM] Albesa Seen, PA-C       This is a 53 male with a history of COPD, asthma presenting for shortness of breath, wheezing, and sensation of throat swelling.  This was after an injection for a dental block.  Unclear if this was a true allergic reaction versus COPD exacerbation versus response to mucus in the throat is a normal physiologic response.  Patient treated allergic reaction, however does not meet criteria for EpiPen administration at this time.  He is being observed.   Patient had incidental  elevation of troponin.  He is having no active chest pain.  And symptoms of shortness of breath are improving.  Doubt ACS.  Patient has very few risk factors for ACS.  He has no family history of early MI, is never smoker.  His primary problems are lung disease.  Suspect that the troponin is likely demand ischemia related secondary to lung disease.  Patient remained chest pain-free in the emergency department with complete resolution of his symptoms that he initially presented with.  New troponin values were reviewed with current attending physician, Dr. Thelma Comp antibody, who recommended follow-up with primary care for further work-up, as patient's troponin elevations have low likelihood for ACS.   Given wheezing on arrival, possible concomitant COPD exacerbation.  He was observed greater than 3 hours, and had no change in status.  No requirement of further medications for the first allergic reaction.  Patient prescribed prednisone, Pepcid, and Zyrtec.  Patient given resources to follow-up with primary care.  He was given strict return precautions for any development of chest pain, shortness of breath, dizziness, lightheadedness, syncope or presyncope.  Patient is in understanding and agrees with the plan of care.  This is a shared visit with Dr. Varney Biles. Patient was independently evaluated by this attending physician. Attending physician consulted in evaluation and discharge management.  Final Clinical Impressions(s) / ED Diagnoses   Final diagnoses:  Allergic reaction, initial encounter  Shortness of breath    ED Discharge Orders    None       Tamala Julian 04/28/18 Juliann Mule, MD 05/09/18 [phone]

## 2018-04-27 NOTE — ED Notes (Signed)
Patient transported to X-ray 

## 2018-04-27 NOTE — ED Notes (Signed)
Pt ambulated to BR with no difficulties.

## 2018-04-27 NOTE — ED Triage Notes (Signed)
Patient states that he was at the dentist and began feeling like he was suffocating, throat closing approx 10 minutes ago. Patient states the dentist told him to come to the ED. Patient is talking, but states he feels like "he is stopped up."

## 2018-04-27 NOTE — Discharge Instructions (Signed)
Please read and follow all provided instructions.  Your diagnoses today include:  1. Allergic reaction, initial encounter   2. Shortness of breath     Tests performed today include: Vital signs. See below for your results today.   Medications prescribed:   Take any prescribed medications only as directed.  Home care instructions:  Follow any educational materials contained in this packet  Follow-up instructions: Please follow-up with your primary care provider in the next 3 days for further evaluation of your symptoms.   Return instructions:  Please return to the Emergency Department if you experience worsening symptoms.  Please return to the emergency department immediately if you develop any chest pain, worsening shortness of breath, dizziness, lightheadedness, feeling or go to pass out.  Should you experience any sudden worsening symptoms, please call 911 immediately. Please return if you have any other emergent concerns.  Additional Information:  Your vital signs today were: BP 126/71 (BP Location: Right Arm)    Pulse (!) 54    Temp 97.8 F (36.6 C) (Oral)    Resp 16    Ht 5\' 10"  (1.778 m)    Wt 93 kg    SpO2 100%    BMI 29.41 kg/m  If your blood pressure (BP) was elevated above 130/80 this visit, please have this repeated by your doctor within one month. --------------  Thank you for allowing [place] to participate in your care today!

## 2018-04-30 ENCOUNTER — Other Ambulatory Visit: Payer: Self-pay

## 2018-04-30 ENCOUNTER — Encounter (HOSPITAL_COMMUNITY): Payer: Self-pay | Admitting: *Deleted

## 2018-04-30 NOTE — Progress Notes (Signed)
Denies chest pain or shob. States he does not see a cardiologist.

## 2018-05-02 ENCOUNTER — Encounter (HOSPITAL_COMMUNITY): Payer: Self-pay | Admitting: Vascular Surgery

## 2018-05-02 ENCOUNTER — Telehealth: Payer: Self-pay | Admitting: Surgery

## 2018-05-02 NOTE — Telephone Encounter (Signed)
ED CM attempted to contact patient concerning assistance with referral to primary care, patient does not have a working number. No further attempts will be made.

## 2018-05-02 NOTE — Progress Notes (Signed)
Anesthesia Chart Review: SAME DAY WORK-UP   Case:  [id number] Date/Time:  05/03/18 1022   Procedure:  DENTAL EXTRACTIONS TIMES NINE (Bilateral )   Anesthesia type:  General   Pre-op diagnosis:  NON RESTORABLE   Location:  [facility] OR ROOM 07 / [facility] OR   Surgeon:  Diona Browner, DDS      DISCUSSION: Patient is a 58 year old male scheduled for the above procedure.  History includes asthma, COPD, OSA (no CPAP), pre-diabetes (although A1c normal at 5.1 05/2017), never smoker. He reported history of MI at age 44 (8).   - ED visit 04/27/18 for allergic reaction versus COPD exacerbation versus response to mucus in the throat. He had received an injection at the dentist's office. He began felling a lot of mucus in the back of his throat and an urge to cough. He reported wheezing, but can be present at baseline as well. He did not have any chest pain or skin itching, but at one point felt sensation of "throat swelling." Notes indicate symptoms mostly improved by arrival to ED. He was treated for possible allergic reaction (albuterol, diphenhydramine, methylprednisolone, famotidine), but did not meet criteria for anaphylaxis requiring epinephrine. Labs showed a troponin I of 0.03->0.04->0.04 which was felt to possibly related to demand ischemia secondary to lung disease. (Also noted to be 0.03 on 10/10/17.) D-dimer was < 0.27, no PE not suspected.  Since troponin stable, patient discharged home with felt stable for "outpatient follow with cardiology."   - ED visit 04/24/18 for knee pain - ED visit 10/10/17 for acute respiratory distress. Given albuterol, Atrovent, Solu-Medrol, magnesium sulfate and IM epinephrine by EMS and placed on CPAP/BiPAP. No active disease by CXR. Troponin mildly elevated at 0.03. His condition improved, but admission recommended, but he signed out AMA.  - ED visit 10/08/17 for COPD exacerbation - Admission 06/23/17-06/25/17 for COPD exacerbation. Out-patient pulmonology evaluation with PFTs  recommended. Also advised to get established with [facility] and [facility].  He does not have a PCP, cardiologist, or pulmonologist. He denied chest pain and SOB during PAT RN phone call.   Reviewed with anesthesiologist Roberts Gaudy, MD. Given recent mildly elevated troponin, anterolateral T wave inversion, reported remote history of MI, and recent recommendation by ED provider to follow-up with out-patient cardiology, it is recommended that he should have a preoperative cardiology evaluation for elective surgery. I have notified Sam at Dr. Lupita Leash office. If procedure felt more urgent then Dr. Hoyt Koch can speak with an anesthesiologist. If it is felt that procedure can be safely postponed then Sam will contact patient and OR scheduling.     VS: Vitals at 04/27/18 ED visit: BP (!) 161/85 (BP Location: Right Arm)   Pulse 88   Temp 97.8 F (36.6 C) (Oral)   Resp 18   Ht 5\' 10"  (1.778 m)   Wt 93 kg   SpO2 100%   BMI 29.41 kg/m    PROVIDERS: Patient, No Pcp Per   LABS: Labs on 04/27/18 included: Lab Results  Component Value Date   WBC 12.9 (H) 04/27/2018   HGB 14.3 04/27/2018   HCT 46.7 04/27/2018   PLT 207 04/27/2018   GLUCOSE 96 04/27/2018   ALT 29 06/25/2017   AST 18 06/25/2017   NA 140 04/27/2018   K 4.3 04/27/2018   CL 103 04/27/2018   CREATININE 0.98 04/27/2018   BUN 22 (H) 04/27/2018   CO2 31 04/27/2018   HGBA1C 5.1 06/25/2017  Non-reactive HIV screen  06/23/17.   IMAGES: CXR 04/27/18: FINDINGS: Normal heart size, mediastinal contours, and pulmonary vascularity. Lungs clear. No infiltrate, pleural effusion or pneumothorax. Bones unremarkable. IMPRESSION: No acute abnormalities.  CTA chest 06/24/17: FINDINGS: - Cardiovascular: The heart size is normal. No pericardial effusion. The pulmonary arteries are well opacified by contrast bolus. There is no acute pulmonary embolus. No significant coronary artery calcification. There is  atherosclerotic calcification of the thoracic aortic arch, not associated with aneurysm. - Mediastinum/Nodes: Esophagus is normal in appearance. The visualized portion of the thyroid gland has a normal appearance. No mediastinal, hilar, or axillary adenopathy. - Lungs/Pleura: Lungs are clear. No pleural effusion or pneumothorax. - Upper Abdomen: No acute abnormality. - Musculoskeletal: No chest wall abnormality. No acute or significant osseous findings. IMPRESSION: 1. Technically adequate exam showing no acute pulmonary embolus. 2.  Aortic atherosclerosis.  (ICD10-I70.0) 3.  No evidence for acute  abnormality.   EKG: 04/27/18: SR, borderline T wave abnormalities (mildly negative T waves V3-6). Not significantly changed when compared to 4/2/419 tracing.    CV: N/A   Past Medical History:  Diagnosis Date  . Asthma   . COPD (chronic obstructive pulmonary disease) ([place])   . Myocardial infarction ([place])    1981 at age 89  . Pre-diabetes   . Sleep apnea    No CPAP    History reviewed. No pertinent surgical history.  MEDICATIONS: No current facility-administered medications for this encounter.    Marland Kitchen albuterol (PROVENTIL) (5 MG/ML) 0.5% nebulizer solution  . aspirin 325 MG tablet  . budesonide-formoterol (SYMBICORT) 160-4.5 MCG/ACT inhaler  . cetirizine (ZYRTEC) 10 MG tablet  . famotidine (PEPCID) 20 MG tablet  . guaiFENesin (MUCINEX) 600 MG 12 hr tablet  . Multiple Vitamin (MULTIVITAMIN WITH MINERALS) TABS tablet  . naproxen (NAPROSYN) 500 MG tablet  . PROVENTIL HFA 108 (90 Base) MCG/ACT inhaler  . tiotropium (SPIRIVA HANDIHALER) 18 MCG inhalation capsule    Myra Gianotti, PA-C Surgical Short Stay/Anesthesiology [facility] Phone [phone] [facility] Phone [phone] 05/02/2018 12:11 PM

## 2018-05-03 ENCOUNTER — Ambulatory Visit (HOSPITAL_COMMUNITY): Admission: RE | Admit: 2018-05-03 | Payer: Medicaid Other | Source: Ambulatory Visit | Admitting: Oral Surgery

## 2018-05-03 HISTORY — DX: Acute myocardial infarction, unspecified: I21.9

## 2018-05-03 HISTORY — DX: Prediabetes: R73.03

## 2018-05-03 SURGERY — DENTAL RESTORATION/EXTRACTIONS
Anesthesia: General | Laterality: Bilateral

## 2019-04-13 ENCOUNTER — Encounter: Payer: Self-pay | Admitting: Internal Medicine

## 2019-06-09 ENCOUNTER — Encounter: Payer: Self-pay | Admitting: Gastroenterology

## 2019-07-21 ENCOUNTER — Other Ambulatory Visit: Payer: Self-pay

## 2019-07-21 ENCOUNTER — Ambulatory Visit (AMBULATORY_SURGERY_CENTER): Payer: Self-pay

## 2019-07-21 VITALS — Ht 73.0 in | Wt 232.0 lb

## 2019-07-21 DIAGNOSIS — Z1211 Encounter for screening for malignant neoplasm of colon: Secondary | ICD-10-CM

## 2019-07-21 DIAGNOSIS — Z01818 Encounter for other preprocedural examination: Secondary | ICD-10-CM

## 2019-07-21 MED ORDER — NA SULFATE-K SULFATE-MG SULF 17.5-3.13-1.6 GM/177ML PO SOLN: 1.0000 | Freq: Once | ORAL | 0 refills | Status: AC

## 2019-07-21 NOTE — Progress Notes (Signed)
No allergies to soy or egg Pt is not on blood thinners or diet pills  Unable to assess issues with sedation/intubation-denies surgeries  Denies atrial flutter/fib Denies constipation   Emmi instructions given to pt  Pt is aware of Covid safety and care partner requirements.   Sheron Erkkila wife present.

## 2019-08-02 ENCOUNTER — Ambulatory Visit (INDEPENDENT_AMBULATORY_CARE_PROVIDER_SITE_OTHER): Payer: Medicaid Other

## 2019-08-02 ENCOUNTER — Other Ambulatory Visit: Payer: Self-pay | Admitting: Gastroenterology

## 2019-08-02 DIAGNOSIS — Z1159 Encounter for screening for other viral diseases: Secondary | ICD-10-CM

## 2019-08-03 LAB — SARS CORONAVIRUS 2 (TAT 6-24 HRS): SARS Coronavirus 2: NEGATIVE

## 2019-08-04 ENCOUNTER — Other Ambulatory Visit: Payer: Self-pay

## 2019-08-04 ENCOUNTER — Encounter: Payer: Self-pay | Admitting: Gastroenterology

## 2019-08-04 ENCOUNTER — Ambulatory Visit (AMBULATORY_SURGERY_CENTER): Payer: Medicaid Other | Admitting: Gastroenterology

## 2019-08-04 VITALS — BP 117/63 | HR 69 | Temp 97.5°F | Resp 17 | Ht 73.0 in | Wt 232.0 lb

## 2019-08-04 DIAGNOSIS — Z1211 Encounter for screening for malignant neoplasm of colon: Secondary | ICD-10-CM | POA: Diagnosis not present

## 2019-08-04 DIAGNOSIS — D120 Benign neoplasm of cecum: Secondary | ICD-10-CM

## 2019-08-04 DIAGNOSIS — D122 Benign neoplasm of ascending colon: Secondary | ICD-10-CM

## 2019-08-04 DIAGNOSIS — D123 Benign neoplasm of transverse colon: Secondary | ICD-10-CM

## 2019-08-04 DIAGNOSIS — D124 Benign neoplasm of descending colon: Secondary | ICD-10-CM

## 2019-08-04 DIAGNOSIS — D128 Benign neoplasm of rectum: Secondary | ICD-10-CM

## 2019-08-04 MED ORDER — SODIUM CHLORIDE 0.9 % IV SOLN: 500.0000 mL | Freq: Once | INTRAVENOUS | Status: AC

## 2019-08-04 NOTE — Progress Notes (Signed)
Vs CW I have reviewed the patient's medical history in detail and updated the computerized patient record.   

## 2019-08-04 NOTE — Patient Instructions (Signed)
YOU HAD AN ENDOSCOPIC PROCEDURE TODAY AT THE [facility]:   Refer to the procedure report that was given to you for any specific questions about what was found during the examination.  If the procedure report does not answer your questions, please call your gastroenterologist to clarify.  If you requested that your care partner not be given the details of your procedure findings, then the procedure report has been included in a sealed envelope for you to review at your convenience later.  YOU SHOULD EXPECT: Some feelings of bloating in the abdomen. Passage of more gas than usual.  Walking can help get rid of the air that was put into your GI tract during the procedure and reduce the bloating. If you had a lower endoscopy (such as a colonoscopy or flexible sigmoidoscopy) you may notice spotting of blood in your stool or on the toilet paper. If you underwent a bowel prep for your procedure, you may not have a normal bowel movement for a few days.  Please Note:  You might notice some irritation and congestion in your nose or some drainage.  This is from the oxygen used during your procedure.  There is no need for concern and it should clear up in a day or so.  SYMPTOMS TO REPORT IMMEDIATELY:   Following lower endoscopy (colonoscopy or flexible sigmoidoscopy):  Excessive amounts of blood in the stool  Significant tenderness or worsening of abdominal pains  Swelling of the abdomen that is new, acute  Fever of 100F or higher   For urgent or emergent issues, a gastroenterologist can be reached at any hour by calling [phone]. Do not use MyChart messaging for urgent concerns.    DIET:  We do recommend a small meal at first, but then you may proceed to your regular diet.  Drink plenty of fluids but you should avoid alcoholic beverages for 24 hours.  MEDICATIONS: Continue present medications.  Please see handouts given to you by your recovery nurse.  ACTIVITY:  You should plan to  take it easy for the rest of today and you should NOT DRIVE or use heavy machinery until tomorrow (because of the sedation medicines used during the test).    FOLLOW UP: Our staff will call the number listed on your records 48-72 hours following your procedure to check on you and address any questions or concerns that you may have regarding the information given to you following your procedure. If we do not reach you, we will leave a message.  We will attempt to reach you two times.  During this call, we will ask if you have developed any symptoms of COVID 19. If you develop any symptoms (ie: fever, flu-like symptoms, shortness of breath, cough etc.) before then, please call [phone].  If you test positive for Covid 19 in the 2 weeks post procedure, please call and report this information to us.    If any biopsies were taken you will be contacted by phone or by letter within the next 1-3 weeks.  Please call us at [phone] if you have not heard about the biopsies in 3 weeks.   Thank you for allowing us to provide for your healthcare needs today.   SIGNATURES/CONFIDENTIALITY: You and/or your care partner have signed paperwork which will be entered into your electronic medical record.  These signatures attest to the fact that that the information above on your After Visit Summary has been reviewed and is understood.  Full responsibility of the   confidentiality of this discharge information lies with you and/or your care-partner. 

## 2019-08-04 NOTE — Op Note (Signed)
Jack Cunningham: [name] Procedure Date: 08/04/2019 9:47 AM MRN: [id number] Endoscopist: Remo Lipps P. Havery Moros , MD Age: 59 Referring MD:  Date of Birth: 09-26-1960 Gender: Male Account #: [id number] Procedure:                Colonoscopy Indications:              Screening for colorectal malignant neoplasm, This                            is the patient's first colonoscopy Medicines:                Monitored Anesthesia Care Procedure:                Pre-Anesthesia Assessment:                           - Prior to the procedure, a History and Physical                            was performed, and patient medications and                            allergies were reviewed. The patient's tolerance of                            previous anesthesia was also reviewed. The risks                            and benefits of the procedure and the sedation                            options and risks were discussed with the patient.                            All questions were answered, and informed consent                            was obtained. Prior Anticoagulants: The patient has                            taken no previous anticoagulant or antiplatelet                            agents. ASA Grade Assessment: III - A patient with                            severe systemic disease. After reviewing the risks                            and benefits, the patient was deemed in                            satisfactory condition to undergo the procedure.  After obtaining informed consent, the colonoscope                            was passed under direct vision. Throughout the                            procedure, the patient's blood pressure, pulse, and                            oxygen saturations were monitored continuously. The                            Colonoscope was introduced through the anus and                            advanced to the the  cecum, identified by                            appendiceal orifice and ileocecal valve. The                            colonoscopy was performed without difficulty. The                            patient tolerated the procedure well. The quality                            of the bowel preparation was good. The ileocecal                            valve, appendiceal orifice, and rectum were                            photographed. Scope In: 9:57:43 AM Scope Out: 10:21:51 AM Scope Withdrawal Time: 0 hours 19 minutes 30 seconds  Total Procedure Duration: 0 hours 24 minutes 8 seconds  Findings:                 The perianal and digital rectal examinations were                            normal.                           Three sessile polyps were found in the cecum. The                            polyps were 3 mm in size. These polyps were removed                            with a cold snare. Resection and retrieval were                            complete.  A diminutive polyp was found in the ascending                            colon. The polyp was sessile. The polyp was removed                            with a cold snare. Resection and retrieval were                            complete.                           Three sessile polyps were found in the hepatic                            flexure. The polyps were 2 to 5 mm in size. These                            polyps were removed with a cold snare. Resection                            and retrieval were complete.                           A 3 mm polyp was found in the descending colon. The                            polyp was sessile. The polyp was removed with a                            cold snare. Resection and retrieval were complete.                           A 7 mm polyp was found in the descending colon. The                            polyp was pedunculated. The polyp was removed with                             a hot snare. Resection and retrieval were complete.                           Three sessile polyps were found in the sigmoid                            colon. The polyps were 3 to 4 mm in size. These                            polyps were removed with a cold snare. Resection                            and retrieval were complete.  A 3 mm polyp was found in the rectum. The polyp was                            sessile. The polyp was removed with a cold snare.                            Resection and retrieval were complete.                           Internal hemorrhoids were found during                            retroflexion. The hemorrhoids were small.                           The exam was otherwise without abnormality. Complications:            No immediate complications. Estimated blood loss:                            Minimal. Estimated Blood Loss:     Estimated blood loss was minimal. Impression:               - Three 3 mm polyps in the cecum, removed with a                            cold snare. Resected and retrieved.                           - One diminutive polyp in the ascending colon,                            removed with a cold snare. Resected and retrieved.                           - Three 2 to 5 mm polyps at the hepatic flexure,                            removed with a cold snare. Resected and retrieved.                           - One 3 mm polyp in the descending colon, removed                            with a cold snare. Resected and retrieved.                           - One 7 mm polyp in the descending colon, removed                            with a hot snare. Resected and retrieved.                           - Three  3 to 4 mm polyps in the sigmoid colon,                            removed with a cold snare. Resected and retrieved.                           - One 3 mm polyp in the rectum, removed with a cold                             snare. Resected and retrieved.                           - Internal hemorrhoids.                           - The examination was otherwise normal. Recommendation:           - Patient has a contact number available for                            emergencies. The signs and symptoms of potential                            delayed complications were discussed with the                            patient. Return to normal activities tomorrow.                            Written discharge instructions were provided to the                            patient.                           - Resume previous diet.                           - Continue present medications.                           - Await pathology results. Remo Lipps P. Rubby Barbary, MD 08/04/2019 10:30:01 AM This report has been signed electronically.

## 2019-08-04 NOTE — Progress Notes (Signed)
Report given to PACU, vss 

## 2019-08-04 NOTE — Progress Notes (Signed)
Called to room to assist during endoscopic procedure.  Patient ID and intended procedure confirmed with present staff. Received instructions for my participation in the procedure from the performing physician.  

## 2019-08-08 ENCOUNTER — Telehealth: Payer: Self-pay

## 2019-08-08 NOTE — Telephone Encounter (Signed)
  Follow up Call-  Call back number 08/04/2019  Post procedure Call Back phone  # [phone]  Permission to leave phone message Yes  Some recent data might be hidden     Patient questions:  Do you have a fever, pain , or abdominal swelling? No. Pain Score  0 *  Have you tolerated food without any problems? Yes.    Have you been able to return to your normal activities? Yes.    Do you have any questions about your discharge instructions: Diet   No. Medications  No. Follow up visit  No.  Do you have questions or concerns about your Care? No.  Actions: * If pain score is 4 or above: No action needed, pain <4.  1. Have you developed a fever since your procedure? no  2.   Have you had an respiratory symptoms (SOB or cough) since your procedure? no  3.   Have you tested positive for COVID 19 since your procedure no  4.   Have you had any family members/close contacts diagnosed with the COVID 19 since your procedure?  no   If yes to any of these questions please route to Joylene John, RN and Erenest Rasher, RN

## 2019-08-08 NOTE — Progress Notes (Signed)
This was a work note the patient requested. I printed it out and gave it to him prior to discharge. Please disregard. Thank you.

## 2019-08-10 ENCOUNTER — Encounter: Payer: Self-pay | Admitting: Gastroenterology

## 2020-05-02 ENCOUNTER — Other Ambulatory Visit: Payer: Self-pay | Admitting: General Surgery

## 2020-08-22 NOTE — H&P (Signed)
HISTORY AND PHYSICAL  Jack Cunningham is a 60 y.o. male patient referred by general dentist for multiple extractions.   No diagnosis found.  Past Medical History:  Diagnosis Date   Asthma    COPD (chronic obstructive pulmonary disease) ([place])    Myocardial infarction ([place])    1981 at age 53   Pre-diabetes    Sleep apnea    No CPAP    Current Facility-Administered Medications  Medication Dose Route Frequency Provider Last Rate Last Admin   0.9 %  sodium chloride infusion  500 mL Intravenous Once Armbruster, Carlota Raspberry, MD       Current Outpatient Medications  Medication Sig Dispense Refill   albuterol (PROVENTIL) (5 MG/ML) 0.5% nebulizer solution Take 0.5 mLs (2.5 mg total) by nebulization every 6 (six) hours as needed for wheezing or shortness of breath. 20 mL 12   budesonide-formoterol (SYMBICORT) 160-4.5 MCG/ACT inhaler Inhale 2 puffs into the lungs daily. 1 Inhaler 12   cetirizine (ZYRTEC) 10 MG tablet Take 1 tablet (10 mg total) by mouth daily for 5 days. 5 tablet 0   Allergies  Allergen Reactions   Novocain [Procaine] Swelling    Felt like throat was swelling up   Active Problems:   * No active hospital problems. *  Vitals: There were no vitals taken for this visit. Lab results:No results found for this or any previous visit (from the past 32 hour(s)). Radiology Results: No results found. General appearance: alert, cooperative, no distress, and moderately obese Head: Normocephalic, without obvious abnormality, atraumatic Eyes: conjunctivae/corneas clear. PERRL, EOM's intact. Fundi benign. Nose: Nares normal. Septum midline. Mucosa normal. No drainage or sinus tenderness. Throat: Multiple carious teeth # 4, 5, 7, 8, 9, 10, 12, 16, 32. No purulence, edema, fluctuance, trismus. Pharynx clear.  Neck: no adenopathy and supple, symmetrical, trachea midline Resp: clear to auscultation bilaterally Cardio: regular rate and rhythm, S1, S2 normal, no murmur, click, rub or  gallop  Assessment: Non-restorable teeth secondary to dental caries.  Plan: Dental extractions with alveoloplasty. GA. Day surgery.   Diona Browner 08/22/2020

## 2020-08-28 NOTE — Progress Notes (Signed)
Error

## 2020-08-29 ENCOUNTER — Encounter (HOSPITAL_COMMUNITY): Payer: Self-pay | Admitting: Oral Surgery

## 2020-08-29 NOTE — Progress Notes (Signed)
Attempted to contact pt throughout the day, left one VM at about 0945 with call back number and continued to try to reach pt and pt wife all day. No returns calls by all attempted numbers on file. Dr. Hoyt Koch [employer] to make aware. Both numbers on file now say "voicemail full".

## 2020-08-29 NOTE — Anesthesia Preprocedure Evaluation (Addendum)
Anesthesia Evaluation  Patient identified by MRN, date of birth, ID band Patient awake    Reviewed: Allergy & Precautions, NPO status , Patient's Chart, lab work & pertinent test results  Airway Mallampati: III  TM Distance: >3 FB Neck ROM: Full    Dental  (+) Dental Advisory Given   Pulmonary asthma , sleep apnea , COPD,  COPD inhaler,    breath sounds clear to auscultation       Cardiovascular METS (>4 METS): (-) angina+ Past MI   Rhythm:Regular Rate:Normal     Neuro/Psych negative neurological ROS     GI/Hepatic negative GI ROS, Neg liver ROS,   Endo/Other  negative endocrine ROS  Renal/GU negative Renal ROS     Musculoskeletal   Abdominal   Peds  Hematology negative hematology ROS (+)   Anesthesia Other Findings   Reproductive/Obstetrics                             Anesthesia Physical Anesthesia Plan  ASA: 3  Anesthesia Plan: General   Post-op Pain Management:    Induction: Intravenous  PONV Risk Score and Plan: 2 and Dexamethasone, Ondansetron and Treatment may vary due to age or medical condition  Airway Management Planned: Nasal ETT  Additional Equipment:   Intra-op Plan:   Post-operative Plan: Extubation in OR  Informed Consent: I have reviewed the patients History and Physical, chart, labs and discussed the procedure including the risks, benefits and alternatives for the proposed anesthesia with the patient or authorized representative who has indicated his/her understanding and acceptance.     Dental advisory given  Plan Discussed with: CRNA  Anesthesia Plan Comments: (See PAT note written 08/29/2020 by Myra Gianotti, PA-C. Same day work-up. )       Anesthesia Quick Evaluation

## 2020-08-29 NOTE — Progress Notes (Signed)
Anesthesia Chart Review: Jack Cunningham  Case: [id number] Date/Time: 08/30/20 1130   Procedure: DENTAL RESTORATION/EXTRACTIONS   Anesthesia type: General   Pre-op diagnosis: DENTAL CARIES   Location: [facility] OR ROOM 04 / [place] [facility]   Surgeons: Diona Browner, DMD       DISCUSSION: Patient is a 60 year old male scheduled for the above procedure.  History includes asthma, COPD, OSA (no CPAP), pre-diabetes (although A1c normal at 5.1 05/2017), never smoker. He reported history of MI at age 54 (43), although no additional details known currently.  He was initially going to have this dental surgery on 05/03/18, but he had had a recent ED visit 04/27/18 for allergic reaction versus COPD/asthma exacerbation after receiving an injection at the dentist office. He began feeling a lot of mucus in the back of his throat and an urge to cough. He reported wheezing, but known to have some wheezing at baseline. No chest pain. The dentist instructed him to go to the ED, although symptoms mostly improved by arrival. He was treated for possible allergic reaction (albuterol, diphenhydramine, methylprednisolone, famotidine), but did not meet criteria for anaphylaxis requiring epinephrine. Labs showed a troponin I of 0.03->0.04->0.04 which was felt to possibly related to demand ischemia secondary to lung disease. (Also noted to be 0.03 on 10/10/17 during evaluation for acute respiratory distress, treated with albuterol, Solu-Medrol, magnesium sulfate, IM Epinephrine by EMS and placed on CPAP/BiPAP. No active disease by CXR. Condition improved, he refused admission.) D-dimer was < 0.27, so no PE not suspected. He was advised "follow-up with primary care for further work-up, as patient's troponin elevations have low likelihood for ACS."  When his dental procedure was scheduled just a month later, Jack Cunningham had still not followed up with a PCP and/or cardiologist, so surgery was cancelled.  Since March 2020, patient has not seen a  cardiologist, but has gotten established with primary care at [facility]. Per staff, last visit 11/2019, awaiting records. There is no mention of MI history on his 12/23/18 visit when he got established there (note scanned under Media tab). Since this morning have only gotten voice message when attempting to contact patient. PAT RN has also left a voice message for him to call. Dr. Hoyt Koch did not have any additional primary care records.    Colonoscopy with multiple polyp resections done under [facility] on 08/04/19 by Winston Cellar, MD at the [place].   Reviewed available information with anesthesiologist Suella Broad, MD. Anesthesia team to evaluate on the day of surgery and definitive anesthesia plan made at that time. He will need preoperative EKG and labs.    VS:  BP Readings from Last 3 Encounters:  08/04/19 117/63  04/27/18 131/80  04/24/18 (!) 145/74   Pulse Readings from Last 3 Encounters:  08/04/19 69  04/27/18 77  04/24/18 70     PROVIDERS: PCP is with [facility] at [facility] in [place], established 12/23/18. Last visit 11/2019. Records requested.     LABS: For day of surgery.   IMAGES: CXR 04/27/18: FINDINGS: Normal heart size, mediastinal contours, and pulmonary vascularity. Lungs clear. No infiltrate, pleural effusion or pneumothorax. Bones unremarkable. IMPRESSION: No acute abnormalities.   CTA chest 06/24/17: FINDINGS: - Cardiovascular: The heart size is normal. No pericardial effusion. The pulmonary arteries are well opacified by contrast bolus. There is no acute pulmonary embolus. No significant coronary artery calcification. There is atherosclerotic calcification of the thoracic aortic arch, not  associated with aneurysm. - Mediastinum/Nodes: Esophagus is normal in appearance. The visualized portion of the thyroid gland has a normal appearance. No mediastinal, hilar, or axillary  adenopathy. - Lungs/Pleura: Lungs are clear. No pleural effusion or pneumothorax. - Upper Abdomen: No acute abnormality. - Musculoskeletal: No chest wall abnormality. No acute or significant osseous findings. IMPRESSION: 1. Technically adequate exam showing no acute pulmonary embolus. 2.  Aortic atherosclerosis.  (ICD10-I70.0) 3.  No evidence for acute  abnormality.    EKG: Currently, last EKG noted is from 04/27/18:  Normal sinus rhythm RSR prime in V1 or V2, probably normal variant Nonspecific T wave abnormalities, lateral leads Minimal ST elevation Anterior leads Diffuse baseline wander   CV: Non noted in [facility] or Care Everywhere.  Past Medical History:  Diagnosis Date   Asthma    COPD (chronic obstructive pulmonary disease) ([place])    Myocardial infarction ([place])    1981 at age 73   Pre-diabetes    Sleep apnea    No CPAP    Past Surgical History:  Procedure Laterality Date   no surgeries as of 07/21/19      MEDICATIONS:  0.9 %  sodium chloride infusion    albuterol (PROVENTIL) (5 MG/ML) 0.5% nebulizer solution   budesonide-formoterol (SYMBICORT) 160-4.5 MCG/ACT inhaler   cetirizine (ZYRTEC) 10 MG tablet     Myra Gianotti, PA-C Surgical Short Stay/Anesthesiology [facility] Phone [phone] [facility] Phone [phone] 08/29/2020 5:01 PM

## 2020-08-29 NOTE — Progress Notes (Signed)
Per pt, MI that was documented was "not a big deal" and pt was not kept overnight in the hospital. Per pt he recalls that he had experienced SOB and chest pain during physical exertion like exercising/sports at the time which was what prompted a visit to the hospital. Since then he has had not exacerbations and no symptoms of SOB, chest pain. Pt reports to still be active with frequent walks and going to the gym. EKG will be obtained DOS.   Pt denies to being diabetic, pt denies PCP or cardiologist.  -------------  SDW INSTRUCTIONS: given to wife   Your procedure is scheduled on 7/1. Please report to [facility] Main Entrance "A" at [place] and check in at the Admitting office. Call this number if you have problems the morning of surgery: [phone]   Remember: Do not eat or drink after midnight the night before your surgery    Medications to take morning of surgery with a sip of water include: Inhalers -- Please bring all inhalers with you the day of surgery.  zyrtec  As of today, STOP taking any Aspirin (unless otherwise instructed by your surgeon), Aleve, Naproxen, Ibuprofen, Motrin, Advil, Goody's, BC's, all herbal medications, fish oil, and all vitamins.    The Morning of Surgery Do not wear jewelry Do not wear lotions, powders, colognes, or deodorant Men may shave face and neck. Do not bring valuables to the hospital. [facility] is not responsible for any belongings or valuables.  If you are a smoker, DO NOT Smoke 24 hours prior to surgery If you wear a CPAP at night please bring your mask the morning of surgery  Remember that you must have someone to transport you home after your surgery, and remain with you for 24 hours if you are discharged the same day.  Please bring cases for contacts, glasses, hearing aids, dentures or bridgework because it cannot be worn into surgery.   Patients discharged the day of surgery will not be allowed to drive home.   Please shower the  NIGHT BEFORE/MORNING OF SURGERY (use antibacterial soap like DIAL soap if possible). Wear comfortable clothes the morning of surgery. Oral Hygiene is also important to reduce your risk of infection.  Remember - BRUSH YOUR TEETH THE MORNING OF SURGERY WITH YOUR REGULAR TOOTHPASTE  Patient denies shortness of breath, fever, cough and chest pain.

## 2020-08-30 ENCOUNTER — Other Ambulatory Visit: Payer: Self-pay

## 2020-08-30 ENCOUNTER — Ambulatory Visit (HOSPITAL_COMMUNITY): Payer: Medicaid Other | Admitting: Vascular Surgery

## 2020-08-30 ENCOUNTER — Encounter (HOSPITAL_COMMUNITY): Payer: Self-pay | Admitting: Oral Surgery

## 2020-08-30 ENCOUNTER — Ambulatory Visit (HOSPITAL_COMMUNITY)
Admission: RE | Admit: 2020-08-30 | Discharge: 2020-08-30 | Disposition: A | Discharge status: Home / Self Care | Payer: Medicaid Other | Attending: Oral Surgery | Admitting: Oral Surgery

## 2020-08-30 ENCOUNTER — Encounter (HOSPITAL_COMMUNITY)
Admission: RE | Disposition: A | Discharge status: Home / Self Care | Payer: Self-pay | Source: Home / Self Care | Attending: Oral Surgery

## 2020-08-30 DIAGNOSIS — I252 Old myocardial infarction: Secondary | ICD-10-CM | POA: Insufficient documentation

## 2020-08-30 DIAGNOSIS — Z79899 Other long term (current) drug therapy: Secondary | ICD-10-CM | POA: Insufficient documentation

## 2020-08-30 DIAGNOSIS — Z884 Allergy status to anesthetic agent status: Secondary | ICD-10-CM | POA: Insufficient documentation

## 2020-08-30 DIAGNOSIS — Z7951 Long term (current) use of inhaled steroids: Secondary | ICD-10-CM | POA: Diagnosis not present

## 2020-08-30 DIAGNOSIS — J449 Chronic obstructive pulmonary disease, unspecified: Secondary | ICD-10-CM | POA: Diagnosis not present

## 2020-08-30 DIAGNOSIS — K029 Dental caries, unspecified: Secondary | ICD-10-CM | POA: Diagnosis present

## 2020-08-30 DIAGNOSIS — G4733 Obstructive sleep apnea (adult) (pediatric): Secondary | ICD-10-CM | POA: Diagnosis not present

## 2020-08-30 DIAGNOSIS — I700 Atherosclerosis of aorta: Secondary | ICD-10-CM | POA: Insufficient documentation

## 2020-08-30 HISTORY — PX: TOOTH EXTRACTION: SHX859

## 2020-08-30 LAB — CBC
HCT: 49.2 % (ref 39.0–52.0)
Hemoglobin: 15.5 g/dL (ref 13.0–17.0)
MCH: 29.2 pg (ref 26.0–34.0)
MCHC: 31.5 g/dL (ref 30.0–36.0)
MCV: 92.7 fL (ref 80.0–100.0)
Platelets: 232 10*3/uL (ref 150–400)
RBC: 5.31 MIL/uL (ref 4.22–5.81)
RDW: 12.7 % (ref 11.5–15.5)
WBC: 7.6 10*3/uL (ref 4.0–10.5)
nRBC: 0 % (ref 0.0–0.2)

## 2020-08-30 LAB — BASIC METABOLIC PANEL
Anion gap: 10 (ref 5–15)
BUN: 14 mg/dL (ref 6–20)
CO2: 25 mmol/L (ref 22–32)
Calcium: 9 mg/dL (ref 8.9–10.3)
Chloride: 105 mmol/L (ref 98–111)
Creatinine, Ser: 1.18 mg/dL (ref 0.61–1.24)
GFR, Estimated: >60 mL/min (ref >60)
Glucose, Bld: 103 mg/dL — ABNORMAL HIGH (ref 70–99)
Potassium: 4.1 mmol/L (ref 3.5–5.1)
Sodium: 140 mmol/L (ref 135–145)

## 2020-08-30 SURGERY — DENTAL RESTORATION/EXTRACTIONS
Anesthesia: General | Site: Mouth

## 2020-08-30 MED ORDER — LIDOCAINE 2% (20 MG/ML) 5 ML SYRINGE
INTRAMUSCULAR | Status: DC | PRN
  Administered 2020-08-30: 20 mg via INTRAVENOUS

## 2020-08-30 MED ORDER — SUGAMMADEX SODIUM 200 MG/2ML IV SOLN
INTRAVENOUS | Status: DC | PRN
  Administered 2020-08-30: 200 mg via INTRAVENOUS

## 2020-08-30 MED ORDER — LIDOCAINE-EPINEPHRINE 2 %-1:100000 IJ SOLN
INTRAMUSCULAR | Status: AC
  Filled 2020-08-30: qty 1

## 2020-08-30 MED ORDER — EPHEDRINE 5 MG/ML INJ
INTRAVENOUS | Status: AC
  Filled 2020-08-30: qty 20

## 2020-08-30 MED ORDER — ROCURONIUM BROMIDE 10 MG/ML (PF) SYRINGE
PREFILLED_SYRINGE | INTRAVENOUS | Status: DC | PRN
  Administered 2020-08-30: 50 mg via INTRAVENOUS

## 2020-08-30 MED ORDER — MIDAZOLAM HCL 2 MG/2ML IJ SOLN
INTRAMUSCULAR | Status: DC | PRN
  Administered 2020-08-30: 2 mg via INTRAVENOUS

## 2020-08-30 MED ORDER — ROCURONIUM BROMIDE 10 MG/ML (PF) SYRINGE
PREFILLED_SYRINGE | INTRAVENOUS | Status: AC
  Filled 2020-08-30: qty 10

## 2020-08-30 MED ORDER — LACTATED RINGERS IV SOLN: INTRAVENOUS | Status: DC

## 2020-08-30 MED ORDER — PROPOFOL 10 MG/ML IV BOLUS
INTRAVENOUS | Status: AC
  Filled 2020-08-30: qty 20

## 2020-08-30 MED ORDER — AMISULPRIDE (ANTIEMETIC) 5 MG/2ML IV SOLN: 10.0000 mg | Freq: Once | INTRAVENOUS | Status: DC | PRN

## 2020-08-30 MED ORDER — OXYMETAZOLINE HCL 0.05 % NA SOLN
NASAL | Status: AC
  Filled 2020-08-30: qty 30

## 2020-08-30 MED ORDER — OXYMETAZOLINE HCL 0.05 % NA SOLN
NASAL | Status: DC | PRN
  Administered 2020-08-30 (×2): 2 via NASAL

## 2020-08-30 MED ORDER — MIDAZOLAM HCL 2 MG/2ML IJ SOLN
INTRAMUSCULAR | Status: AC
  Filled 2020-08-30: qty 2

## 2020-08-30 MED ORDER — CEFAZOLIN SODIUM-DEXTROSE 2-4 GM/100ML-% IV SOLN
2.0000 g | INTRAVENOUS | Status: AC
  Administered 2020-08-30: 2 g via INTRAVENOUS
  Filled 2020-08-30: qty 100

## 2020-08-30 MED ORDER — ACETAMINOPHEN 500 MG PO TABS
ORAL_TABLET | ORAL | Status: AC
  Filled 2020-08-30: qty 2

## 2020-08-30 MED ORDER — AMOXICILLIN 500 MG PO CAPS: 500.0000 mg | ORAL_CAPSULE | Freq: Three times a day (TID) | ORAL | 0 refills | Status: DC

## 2020-08-30 MED ORDER — 0.9 % SODIUM CHLORIDE (POUR BTL) OPTIME
TOPICAL | Status: DC | PRN
  Administered 2020-08-30: 1000 mL

## 2020-08-30 MED ORDER — PROPOFOL 10 MG/ML IV BOLUS
INTRAVENOUS | Status: DC | PRN
Start: 2020-08-30 — End: 2020-08-31
  Administered 2020-08-30: 130 mg via INTRAVENOUS

## 2020-08-30 MED ORDER — LIDOCAINE 2% (20 MG/ML) 5 ML SYRINGE
INTRAMUSCULAR | Status: AC
  Filled 2020-08-30: qty 5

## 2020-08-30 MED ORDER — HYDROCODONE-ACETAMINOPHEN 5-325 MG PO TABS: 1.0000 | ORAL_TABLET | Freq: Four times a day (QID) | ORAL | 0 refills | Status: AC | PRN

## 2020-08-30 MED ORDER — FENTANYL CITRATE (PF) 250 MCG/5ML IJ SOLN
INTRAMUSCULAR | Status: DC | PRN
  Administered 2020-08-30 (×3): 50 ug via INTRAVENOUS

## 2020-08-30 MED ORDER — SODIUM CHLORIDE 0.9 % IR SOLN
Status: DC | PRN
  Administered 2020-08-30: 1000 mL

## 2020-08-30 MED ORDER — LIDOCAINE-EPINEPHRINE 2 %-1:100000 IJ SOLN
INTRAMUSCULAR | Status: DC | PRN
  Administered 2020-08-30: 20 mL

## 2020-08-30 MED ORDER — ORAL CARE MOUTH RINSE: 15.0000 mL | Freq: Once | OROMUCOSAL | Status: DC

## 2020-08-30 MED ORDER — CHLORHEXIDINE GLUCONATE 0.12 % MT SOLN
15.0000 mL | Freq: Once | OROMUCOSAL | Status: DC
  Filled 2020-08-30: qty 15

## 2020-08-30 MED ORDER — FENTANYL CITRATE (PF) 250 MCG/5ML IJ SOLN
INTRAMUSCULAR | Status: AC
  Filled 2020-08-30: qty 5

## 2020-08-30 MED ORDER — PHENYLEPHRINE 40 MCG/ML (10ML) SYRINGE FOR IV PUSH (FOR BLOOD PRESSURE SUPPORT)
PREFILLED_SYRINGE | INTRAVENOUS | Status: AC
  Filled 2020-08-30: qty 10

## 2020-08-30 MED ORDER — ONDANSETRON HCL 4 MG/2ML IJ SOLN
INTRAMUSCULAR | Status: DC | PRN
  Administered 2020-08-30: 4 mg via INTRAVENOUS

## 2020-08-30 MED ORDER — DEXAMETHASONE SODIUM PHOSPHATE 10 MG/ML IJ SOLN
INTRAMUSCULAR | Status: DC | PRN
  Administered 2020-08-30: 10 mg via INTRAVENOUS

## 2020-08-30 MED ORDER — FENTANYL CITRATE (PF) 100 MCG/2ML IJ SOLN: 25.0000 ug | INTRAMUSCULAR | Status: DC | PRN

## 2020-08-30 SURGICAL SUPPLY — 39 items
BAG COUNTER SPONGE SURGICOUNT (BAG) IMPLANT
BAG SURGICOUNT SPONGE COUNTING (BAG)
BLADE SURG 15 STRL LF DISP TIS (BLADE) ×1 IMPLANT
BLADE SURG 15 STRL SS (BLADE) ×2
BUR CROSS CUT FISSURE 1.6 (BURR) ×2 IMPLANT
BUR CROSS CUT FISSURE 1.6MM (BURR) ×1
BUR EGG ELITE 4.0 (BURR) ×2 IMPLANT
BUR EGG ELITE 4.0MM (BURR) ×1
CANISTER SUCT 3000ML PPV (MISCELLANEOUS) ×3 IMPLANT
COVER SURGICAL LIGHT HANDLE (MISCELLANEOUS) ×3 IMPLANT
DECANTER SPIKE VIAL GLASS SM (MISCELLANEOUS) ×3 IMPLANT
DRAPE U-SHAPE 76X120 STRL (DRAPES) ×3 IMPLANT
GAUZE PACKING FOLDED 2  STR (GAUZE/BANDAGES/DRESSINGS) ×2
GAUZE PACKING FOLDED 2 STR (GAUZE/BANDAGES/DRESSINGS) ×1 IMPLANT
GLOVE SURG ENC MOIS LTX SZ6.5 (GLOVE) IMPLANT
GLOVE SURG ENC MOIS LTX SZ7 (GLOVE) IMPLANT
GLOVE SURG ENC MOIS LTX SZ8 (GLOVE) ×3 IMPLANT
GLOVE SURG UNDER POLY LF SZ6.5 (GLOVE) IMPLANT
GLOVE SURG UNDER POLY LF SZ7 (GLOVE) IMPLANT
GOWN STRL REUS W/ TWL LRG LVL3 (GOWN DISPOSABLE) ×1 IMPLANT
GOWN STRL REUS W/ TWL XL LVL3 (GOWN DISPOSABLE) ×1 IMPLANT
GOWN STRL REUS W/TWL LRG LVL3 (GOWN DISPOSABLE) ×2
GOWN STRL REUS W/TWL XL LVL3 (GOWN DISPOSABLE) ×2
IV NS 1000ML (IV SOLUTION) ×2
IV NS 1000ML BAXH (IV SOLUTION) ×1 IMPLANT
KIT BASIN OR (CUSTOM PROCEDURE TRAY) ×3 IMPLANT
KIT TURNOVER KIT B (KITS) ×3 IMPLANT
NDL HYPO 25GX1X1/2 BEV (NEEDLE) ×2 IMPLANT
NEEDLE HYPO 25GX1X1/2 BEV (NEEDLE) ×6 IMPLANT
NS IRRIG 1000ML POUR BTL (IV SOLUTION) ×3 IMPLANT
PAD ARMBOARD 7.5X6 YLW CONV (MISCELLANEOUS) ×3 IMPLANT
SLEEVE IRRIGATION ELITE 7 (MISCELLANEOUS) ×3 IMPLANT
SPONGE SURGIFOAM ABS GEL 12-7 (HEMOSTASIS) IMPLANT
SUT CHROMIC 3 0 PS 2 (SUTURE) ×3 IMPLANT
SYR BULB IRRIG 60ML STRL (SYRINGE) ×3 IMPLANT
SYR CONTROL 10ML LL (SYRINGE) ×3 IMPLANT
TRAY ENT MC OR (CUSTOM PROCEDURE TRAY) ×3 IMPLANT
TUBING IRRIGATION (MISCELLANEOUS) ×3 IMPLANT
YANKAUER SUCT BULB TIP NO VENT (SUCTIONS) ×3 IMPLANT

## 2020-08-30 NOTE — Anesthesia Procedure Notes (Signed)
Procedure Name: Intubation Date/Time: 08/30/2020 1:08 PM Performed by: Lance Coon, CRNA Pre-anesthesia Checklist: Patient identified, Emergency Drugs available, Suction available, Patient being monitored and Timeout performed Patient Re-evaluated:Patient Re-evaluated prior to induction Oxygen Delivery Method: Circle system utilized Preoxygenation: Pre-oxygenation with 100% oxygen Induction Type: IV induction Ventilation: Mask ventilation without difficulty Laryngoscope Size: Miller and 3 Grade View: Grade II Nasal Tubes: Right, Nasal prep performed and Nasal Rae Tube size: 7.0 mm Number of attempts: 1 Placement Confirmation: positive ETCO2, ETT inserted through vocal cords under direct vision and breath sounds checked- equal and bilateral Tube secured with: Tape Dental Injury: Teeth and Oropharynx as per pre-operative assessment

## 2020-08-30 NOTE — Transfer of Care (Signed)
Immediate Anesthesia Transfer of Care Note  Patient: Jack Cunningham  Procedure(s) Performed: DENTAL RESTORATION/EXTRACTIONS (Mouth)  Patient Location: PACU  Anesthesia Type:General  Level of Consciousness: drowsy and patient cooperative  Airway & Oxygen Therapy: Patient Spontanous Breathing  Post-op Assessment: Report given to RN and Post -op Vital signs reviewed and stable  Post vital signs: Reviewed and stable  Last Vitals:  Vitals Value Taken Time  BP 151/75 08/30/20 1400  Temp    Pulse 78 08/30/20 1400  Resp 20 08/30/20 1400  SpO2 92 % 08/30/20 1400  Vitals shown include unvalidated device data.  Last Pain:  Vitals:   08/30/20 0942  TempSrc: Oral         Complications: No notable events documented.

## 2020-08-30 NOTE — H&P (Signed)
H&P documentation  -History and Physical Reviewed  -Patient has been re-examined  -No change in the plan of care  Jack Cunningham  

## 2020-08-30 NOTE — Op Note (Signed)
08/30/2020  1:48 PM  PATIENT:  Jack Cunningham  60 y.o. male  PRE-OPERATIVE DIAGNOSIS:  NON-RESTORABLE TEETH # 4, 5, 7, 8, 9, 10, 12, 16, 32 SECONDARY TO DENTAL CARIES  POST-OPERATIVE DIAGNOSIS:  SAME  PROCEDURE:  Procedure(s): DENTAL EXTRACTION TEETH # 4, 5, 7, 8, 9, 10, 12, 16, 32  SURGEON:  Surgeon(s): Diona Browner, DMD  ANESTHESIA:   local and general  EBL:  minimal  DRAINS: none   SPECIMEN:  No Specimen  COUNTS:  YES  PLAN OF CARE: Discharge to home after PACU  PATIENT DISPOSITION:  PACU - hemodynamically stable.   PROCEDURE DETAILS: Dictation # [id number]  Gae Bon, DMD 08/30/2020 1:48 PM

## 2020-08-30 NOTE — Op Note (Signed)
NAMEJARIN, CORNFIELD MEDICAL RECORD NO: [id number] ACCOUNT NO: [id number] DATE OF BIRTH: November 17, 1960 FACILITY: [facility] LOCATION: [facility] PHYSICIAN: Gae Bon, DDS  Operative Report   DATE OF PROCEDURE: 08/30/2020  PREOPERATIVE DIAGNOSIS:  Nonrestorable teeth secondary to dental caries, numbers 4, 5, 7, 8, 9, 10, 12, 16, 32.  POSTOPERATIVE DIAGNOSIS:  Nonrestorable teeth secondary to dental caries, numbers 4, 5, 7, 8, 9, 10, 12, 16, 32.  PROCEDURE:  Extraction of teeth numbers 4, 5, 7, 8, 9, 10, 12, 16, 32.  SURGEON:  Diona Browner, DDS  ANESTHESIA:  General, nasal intubation. Dr. Ola Spurr attending.  DESCRIPTION OF PROCEDURE:  The patient was taken to the operating room and placed on the table in supine position.  General anesthesia was administered.  A nasal endotracheal tube was placed and secured.  The eyes were protected and the patient was  draped for surgery.  Timeout was performed.  The posterior pharynx was suctioned and a throat pack was placed.  2% lidocaine, 1:100,000 epinephrine was infiltrated buccally and palatally in the maxilla and in the right mandibular alveolar nerve block.  A  bite block was placed on the right side of the mouth, a sweetheart retractor was used to retract the tongue.  A #15 blade was used to make an incision around tooth numbers 16, 12, 10, 9, 8 and 7.  The periosteum was reflected from around these teeth.   Tooth #16 was removed with the rongeurs.  Tooth #12 required removal of circumferential bone and then the tooth was elevated and removed with dental forceps. Teeth numbers 7, 8, 9 and 10 required removal of circumferential bone and elevation with 301  elevator prior to removal with a dental forceps, then the sockets were curetted, irrigated and closed with 3-0 chromic.  Bite block was then repositioned to the other side of the mouth.  Incision was made around tooth #32.  The periosteum was reflected.   The tooth was elevated and removed from the  mouth with a dental forceps.  Socket was curetted, irrigated and closed with 3-0 chromic.  Then, an incision was created around teeth numbers 4 and 5.  Periosteum was reflected.  The teeth were elevated, but  could not be mobilized.  Circumferential bone was removed from around these teeth and the teeth were elevated and removed with 301 elevator and the universal dental forceps.  Sockets were then curetted, irrigated and closed with 3-0 chromic.  Then, the  oral cavity was inspected and found to have good contour, hemostasis and closure.  The oral cavity was irrigated and suctioned.  The throat pack was removed.  The patient was left under the care of anesthesia for extubation and transport to recovery room  with plans for discharge home through day surgery.  ESTIMATED BLOOD LOSS:  Minimum.  COMPLICATIONS:  None.  SPECIMENS:  None.   SHW D: 08/30/2020 1:52:57 pm T: 08/30/2020 11:33:00 pm  JOB: [id number]/ [id number]

## 2020-09-03 ENCOUNTER — Encounter (HOSPITAL_COMMUNITY): Payer: Self-pay | Admitting: Oral Surgery

## 2020-09-03 NOTE — Anesthesia Postprocedure Evaluation (Signed)
Anesthesia Post Note  Patient: Jack Cunningham  Procedure(s) Performed: DENTAL RESTORATION/EXTRACTIONS (Mouth)     Patient location during evaluation: PACU Anesthesia Type: General Level of consciousness: awake and alert Pain management: pain level controlled Vital Signs Assessment: post-procedure vital signs reviewed and stable Respiratory status: spontaneous breathing, nonlabored ventilation, respiratory function stable and patient connected to nasal cannula oxygen Cardiovascular status: blood pressure returned to baseline and stable Postop Assessment: no apparent nausea or vomiting Anesthetic complications: no   No notable events documented.  Last Vitals:  Vitals:   08/30/20 1400 08/30/20 1430  BP: (!) 151/75 (!) 148/85  Pulse: 77 78  Resp: (!) 22 20  Temp: 36.6 C   SpO2: 95% 100%    Last Pain:  Vitals:   08/30/20 1430  TempSrc:   PainSc: 0-No pain                 Tiajuana Amass

## 2020-09-09 ENCOUNTER — Emergency Department (HOSPITAL_COMMUNITY): Payer: Medicaid Other

## 2020-09-09 ENCOUNTER — Encounter (HOSPITAL_COMMUNITY): Payer: Self-pay | Admitting: *Deleted

## 2020-09-09 ENCOUNTER — Emergency Department (HOSPITAL_COMMUNITY)
Admission: EM | Admit: 2020-09-09 | Discharge: 2020-09-09 | Disposition: A | Discharge status: Home / Self Care | Payer: Medicaid Other | Attending: Emergency Medicine | Admitting: Emergency Medicine

## 2020-09-09 ENCOUNTER — Other Ambulatory Visit: Payer: Self-pay

## 2020-09-09 ENCOUNTER — Emergency Department (HOSPITAL_BASED_OUTPATIENT_CLINIC_OR_DEPARTMENT_OTHER): Payer: Medicaid Other

## 2020-09-09 DIAGNOSIS — M25572 Pain in left ankle and joints of left foot: Secondary | ICD-10-CM | POA: Insufficient documentation

## 2020-09-09 DIAGNOSIS — M79605 Pain in left leg: Secondary | ICD-10-CM

## 2020-09-09 DIAGNOSIS — M7989 Other specified soft tissue disorders: Secondary | ICD-10-CM | POA: Diagnosis not present

## 2020-09-09 MED ORDER — DOXYCYCLINE HYCLATE 100 MG PO TABS
100.0000 mg | ORAL_TABLET | Freq: Once | ORAL | Status: AC
  Administered 2020-09-09: 100 mg via ORAL
  Filled 2020-09-09: qty 1

## 2020-09-09 MED ORDER — DOXYCYCLINE HYCLATE 100 MG PO CAPS: 100.0000 mg | ORAL_CAPSULE | Freq: Two times a day (BID) | ORAL | 0 refills | Status: AC

## 2020-09-09 NOTE — ED Triage Notes (Signed)
Pt reports injury to left ankle two weeks ago, object his lateral ankle and pt still has soreness and swelling to area. Now has redness to lower leg. Denies fever or n/v.

## 2020-09-09 NOTE — ED Provider Notes (Signed)
Emergency Medicine Provider Triage Evaluation Note  Jack Cunningham , a 60 y.o. male  was evaluated in triage.  Pt complains of left ankle pain. Started 2 weeks ago after he dropped a microwave on the ankle. States it has persisted and about 3 days ago he began developing left lower leg swelling with redness just proximal to the ankle. Denies any cuts/wounds occurred when he initially injured the ankle. No fevers or n/v. Sent from [facility] for DVT r/o.   Physical Exam  BP 136/80 (BP Location: Right Arm)   Pulse 75   Temp 98.6 F (37 C) (Oral)   Resp 18   SpO2 96%  Gen:   Awake, no distress   Resp:  Normal effort  MSK:   Moves extremities without difficulty; mild TTP overlying the lateral malleolus; 1+ pitting edema in the left leg up to mid calf. Erythema noted just proximal to the ankle.  Other:    Medical Decision Making  Medically screening exam initiated at 2:36 PM.  Appropriate orders placed.  Keo Schirmer was informed that the remainder of the evaluation will be completed by another provider, this initial triage assessment does not replace that evaluation, and the importance of remaining in the ED until their evaluation is complete.   Rayna Sexton, PA-C 09/09/20 1439    Carmin Muskrat, MD 09/10/20 [phone]

## 2020-09-09 NOTE — ED Provider Notes (Signed)
[facility] EMERGENCY DEPARTMENT Provider Note   CSN: [id number] Arrival date & time: 09/09/20  1312     History Chief Complaint  Patient presents with   Leg Pain    Jack Cunningham is a 60 y.o. male.  Patient presents to the emergency department with a chief complaint of left-sided ankle pain and swelling.  He states that he dropped a microwave on his ankle about 2 weeks ago.  States that 3 days ago he developed leg swelling and some redness just proximal to the ankle.  He denies any fevers or chills.  Was seen in urgent care and was sent to the emergency department to rule out DVT.  He denies any history of PE or DVT.  He states that he has had leg swelling similar to this in the past and has been advised to wear compression stockings.  He denies any pain with ankle movement.  The history is provided by the patient. No language interpreter was used.      Past Medical History:  Diagnosis Date   Asthma    COPD (chronic obstructive pulmonary disease) ([place])    Myocardial infarction ([place])    1981 at age 38   Pre-diabetes    Sleep apnea    No CPAP    Patient Active Problem List   Diagnosis Date Noted   COPD (chronic obstructive pulmonary disease) ([place]) 06/24/2017   COPD exacerbation ([place]) 06/23/2017    Past Surgical History:  Procedure Laterality Date   no surgeries as of 07/21/19     TOOTH EXTRACTION N/A 08/30/2020   Procedure: DENTAL RESTORATION/EXTRACTIONS;  Surgeon: Diona Browner, DMD;  Location: [place];  Service: Oral Surgery;  Laterality: N/A;       Family History  Problem Relation Age of Onset   Diabetes Mother    Cancer Father    Colon polyps Neg Hx    Colon cancer Neg Hx    Esophageal cancer Neg Hx    Rectal cancer Neg Hx    Stomach cancer Neg Hx     Social History   Tobacco Use   Smoking status: Never   Smokeless tobacco: Never  Vaping Use   Vaping Use: Never used  Substance Use Topics   Alcohol use: No   Drug use: No    Home  Medications Prior to Admission medications   Medication Sig Start Date End Date Taking? Authorizing Provider  albuterol (PROVENTIL) (5 MG/ML) 0.5% nebulizer solution Take 0.5 mLs (2.5 mg total) by nebulization every 6 (six) hours as needed for wheezing or shortness of breath. 10/08/17   Couture, Cortni S, PA-C  amoxicillin (AMOXIL) 500 MG capsule Take 1 capsule (500 mg total) by mouth 3 (three) times daily. 08/30/20   Diona Browner, DMD  budesonide-formoterol (SYMBICORT) 160-4.5 MCG/ACT inhaler Inhale 2 puffs into the lungs daily. 10/08/17   Couture, Cortni S, PA-C  cetirizine (ZYRTEC) 10 MG tablet Take 1 tablet (10 mg total) by mouth daily for 5 days. 04/27/18 07/21/19  Langston Masker B, PA-C  HYDROcodone-acetaminophen (NORCO) 5-325 MG tablet Take 1 tablet by mouth every 6 (six) hours as needed for moderate pain. 08/30/20   Diona Browner, DMD    Allergies    Novocain [procaine]  Review of Systems   Review of Systems  All other systems reviewed and are negative.  Physical Exam Updated Vital Signs BP 138/90 (BP Location: Right Arm)   Pulse 68   Temp 98.3 F (36.8 C) (Oral)   Resp 16  SpO2 98%   Physical Exam Vitals and nursing note reviewed.  Constitutional:      General: He is not in acute distress.    Appearance: He is well-developed. He is not ill-appearing.  HENT:     Head: Normocephalic and atraumatic.  Eyes:     Conjunctiva/sclera: Conjunctivae normal.  Cardiovascular:     Rate and Rhythm: Normal rate.     Comments: Intact distal pulses Cap refill slightly diminished ~3 seconds Pulmonary:     Effort: Pulmonary effort is normal. No respiratory distress.  Abdominal:     General: There is no distension.  Musculoskeletal:     Cervical back: Neck supple.     Comments: 1+ pitting edema to left lower extremity No difficulty ranging left ankle Strength 5/5  Skin:    General: Skin is warm and dry.     Comments: Mild erythema of the skin just proximal to the left ankle, no abscess   Neurological:     Mental Status: He is alert and oriented to person, place, and time.  Psychiatric:        Mood and Affect: Mood normal.        Behavior: Behavior normal.    ED Results / Procedures / Treatments   Labs (all labs ordered are listed, but only abnormal results are displayed) Labs Reviewed - No data to display  EKG None  Radiology DG Ankle Complete Left  Result Date: 09/09/2020 CLINICAL DATA:  The patient's left ankle got caught in a truck today with onset of pain. Initial encounter. EXAM: LEFT ANKLE COMPLETE - 3+ VIEW COMPARISON:  None. FINDINGS: There is no evidence of fracture, dislocation, or joint effusion. There is no evidence of arthropathy or other focal bone abnormality. Soft tissues appear somewhat swollen. IMPRESSION: Soft tissue swelling without underlying bony abnormality. Electronically Signed   By: Inge Rise M.D.   On: 09/09/2020 15:12   VAS [place] LOWER EXTREMITY VENOUS (DVT) (ONLY [facility])  Result Date: 09/09/2020  Lower Venous DVT Study Patient Name:  Jack Cunningham  Date of Exam:   09/09/2020 Medical Rec #: [id number]         Accession #:    [id number] Date of Birth: 1960/07/26          Patient Gender: M Patient Age:   3Y Exam Location:  [facility] Procedure:      VAS [place] LOWER EXTREMITY VENOUS (DVT) Referring Phys: [id number] Rayna Sexton --------------------------------------------------------------------------------  Indications: Swelling, and Patient presents with a swollen ankle for two weeks after an injury.  Comparison Study: No prior Performing Technologist: Oda Cogan RDMS, RVT  Examination Guidelines: A complete evaluation includes B-mode imaging, spectral Doppler, color Doppler, and power Doppler as needed of all accessible portions of each vessel. Bilateral testing is considered an integral part of a complete examination. Limited examinations for reoccurring indications may be performed as noted. The reflux portion of the exam is  performed with the patient in reverse Trendelenburg.  +-----+---------------+---------+-----------+----------+--------------+ RIGHTCompressibilityPhasicitySpontaneityPropertiesThrombus Aging +-----+---------------+---------+-----------+----------+--------------+ CFV  Full           Yes      Yes                                 +-----+---------------+---------+-----------+----------+--------------+ SFJ  Full                                                        +-----+---------------+---------+-----------+----------+--------------+   +---------+---------------+---------+-----------+----------+--------------+  LEFT     CompressibilityPhasicitySpontaneityPropertiesThrombus Aging +---------+---------------+---------+-----------+----------+--------------+ CFV      Full           Yes      Yes                                 +---------+---------------+---------+-----------+----------+--------------+ SFJ      Full                                                        +---------+---------------+---------+-----------+----------+--------------+ FV Prox  Full                                                        +---------+---------------+---------+-----------+----------+--------------+ FV Mid   Full                                                        +---------+---------------+---------+-----------+----------+--------------+ FV DistalFull                                                        +---------+---------------+---------+-----------+----------+--------------+ PFV      Full                                                        +---------+---------------+---------+-----------+----------+--------------+ POP      Full           Yes      Yes                                 +---------+---------------+---------+-----------+----------+--------------+ PTV      Full                                                         +---------+---------------+---------+-----------+----------+--------------+ PERO     Full                                                        +---------+---------------+---------+-----------+----------+--------------+     Summary: RIGHT: - No evidence of common femoral vein obstruction.  LEFT: - There is no evidence of deep vein thrombosis in the lower extremity.  - No cystic structure found in the popliteal fossa.  *See table(s) above for measurements and observations.  Electronically signed by Monica Martinez MD on 09/09/2020 at 6:54:48 PM.    Final     Procedures Procedures   Medications Ordered in ED Medications - No data to display  ED Course  I have reviewed the triage vital signs and the nursing notes.  Pertinent labs & imaging results that were available during my care of the patient were reviewed by me and considered in my medical decision making (see chart for details).    MDM Rules/Calculators/A&P                          Patient here with left ankle pain and swelling.  Plain films are negative.  DVT study negative.  He does have mild erythema just proximal to the left ankle along with associated swelling/edema.  He denies fevers, but given the appearance, will cover with doxycycline.  He reports having had similar in the past, and was advised to wear compression stockings.  I have advised him to continue wearing the compression stockings and we will have him follow-up with his PCP and/or vascular.  There is no evidence of septic joint.  No evidence of ischemic limb.  Patient appears stable for discharge. Final Clinical Impression(s) / ED Diagnoses Final diagnoses:  Pain of left lower extremity    Rx / DC Orders ED Discharge Orders     None        Delaine Lame 09/09/20 2229    Davonna Belling, MD 09/09/20 [phone]

## 2020-09-09 NOTE — Discharge Instructions (Addendum)
Please take antibiotics as directed.  Please follow-up with your primary care doctor or the vascular specialist in the next 7 to 10 days if not improving.  Please return to the emergency department if your symptoms worsen.  Use compression stockings while up and about.  When at home resting, keep your foot up and elevated.

## 2020-11-25 ENCOUNTER — Encounter: Payer: Self-pay | Admitting: Gastroenterology

## 2022-01-11 IMAGING — DX DG ANKLE COMPLETE 3+V*L*
3 series · 3 of 3 positions shown · non-contrast
Comparison: None.

CLINICAL DATA: The patient's left ankle got caught in a truck today
with onset of pain. Initial encounter.

EXAM:
LEFT ANKLE COMPLETE - 3+ VIEW

[x ankle ap left]
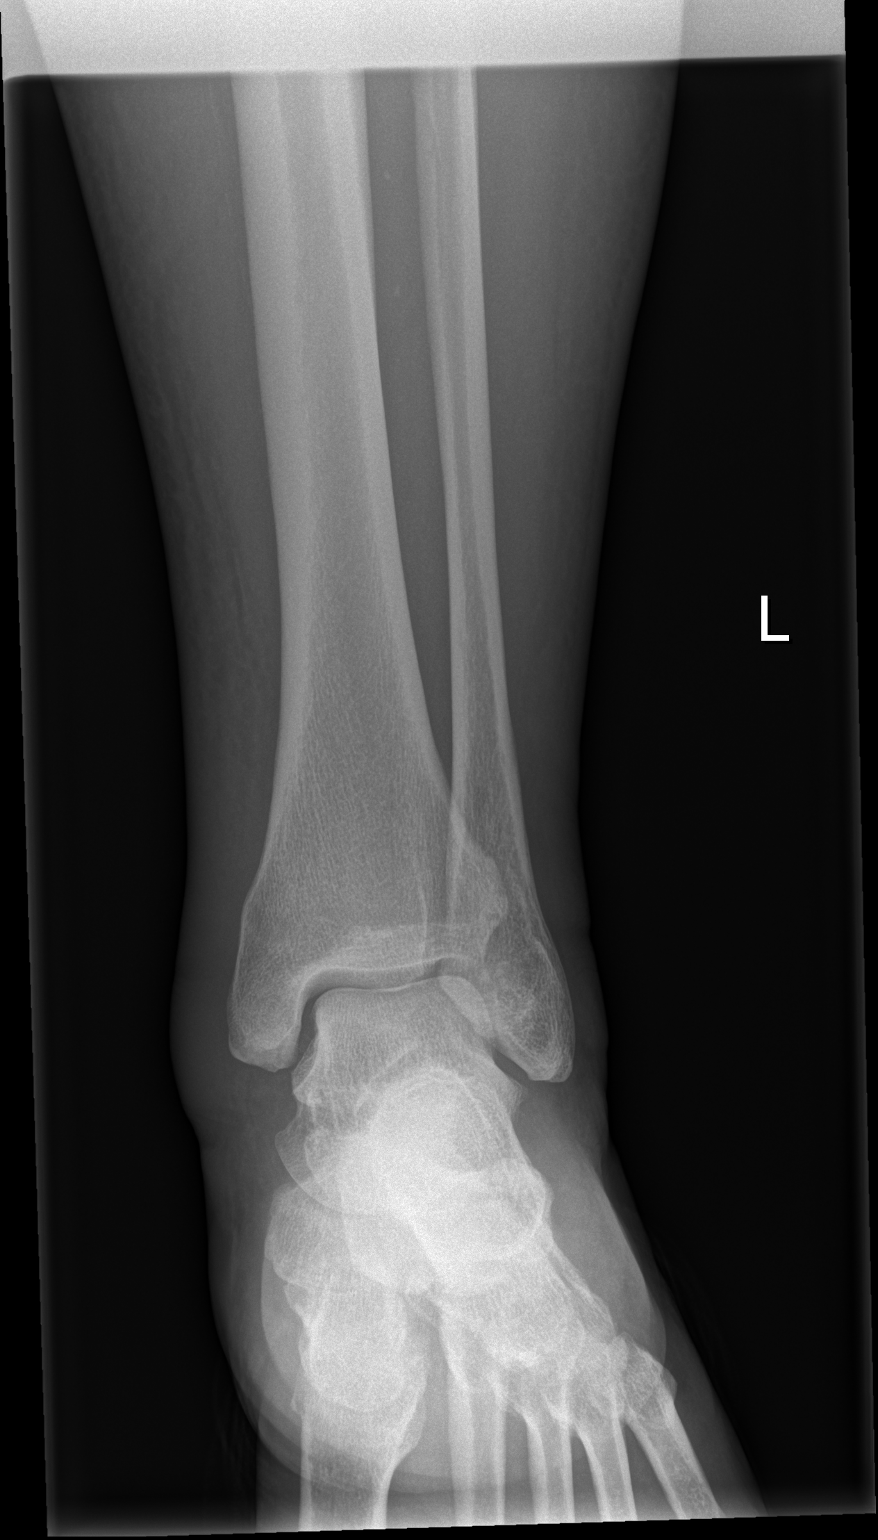

[x ankle obl left]
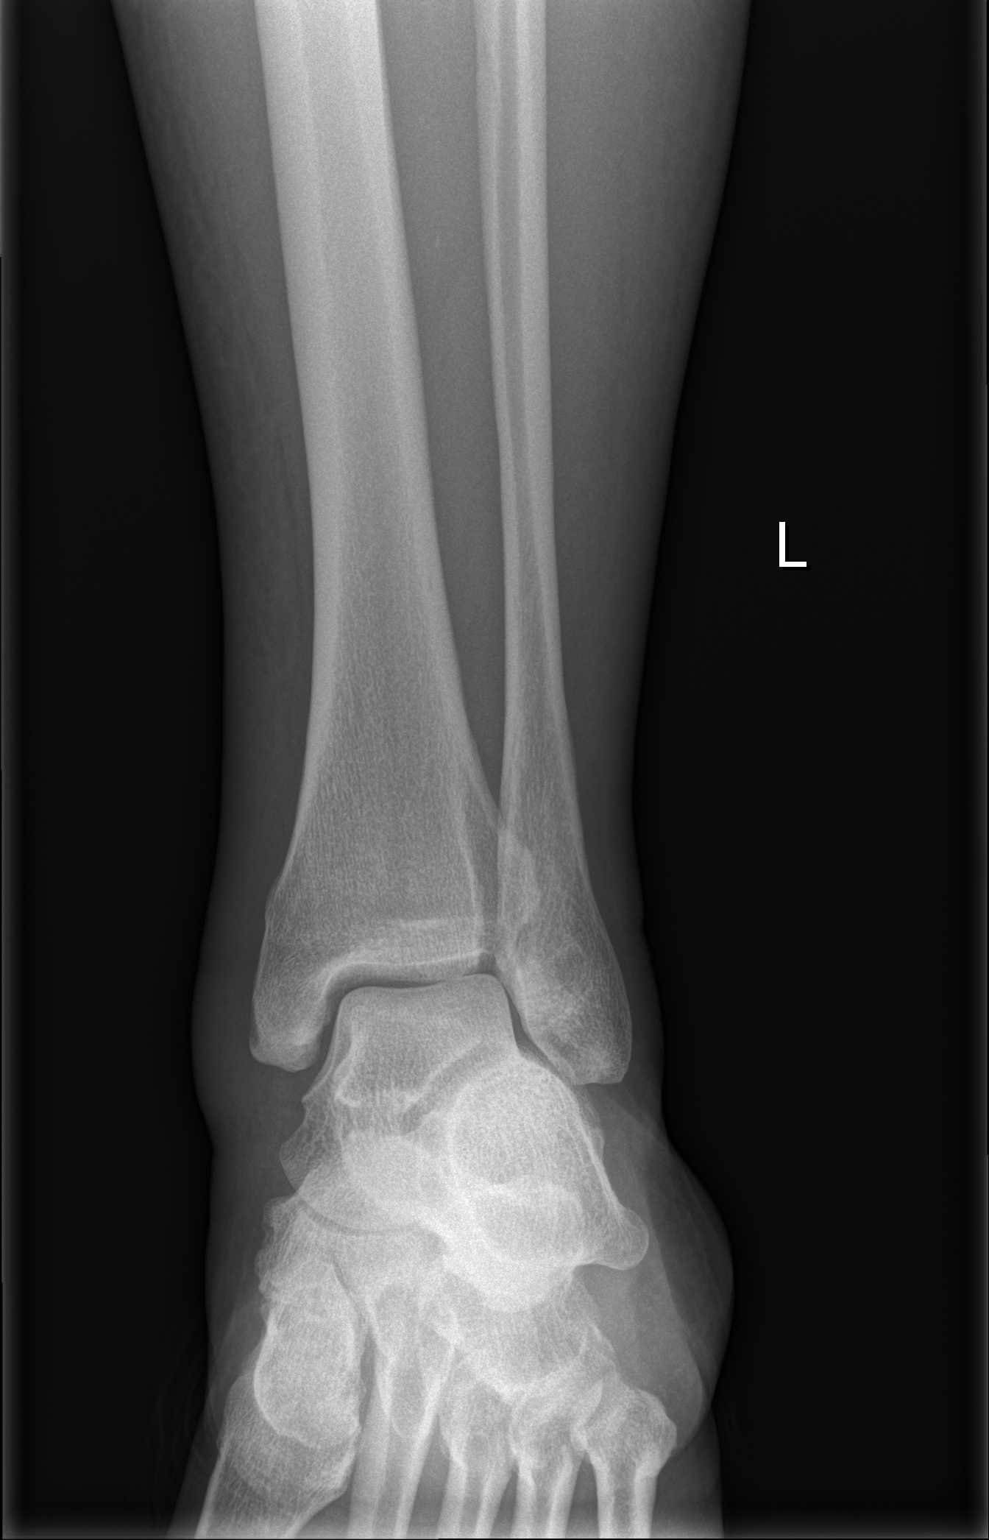

[x ankle lat left]
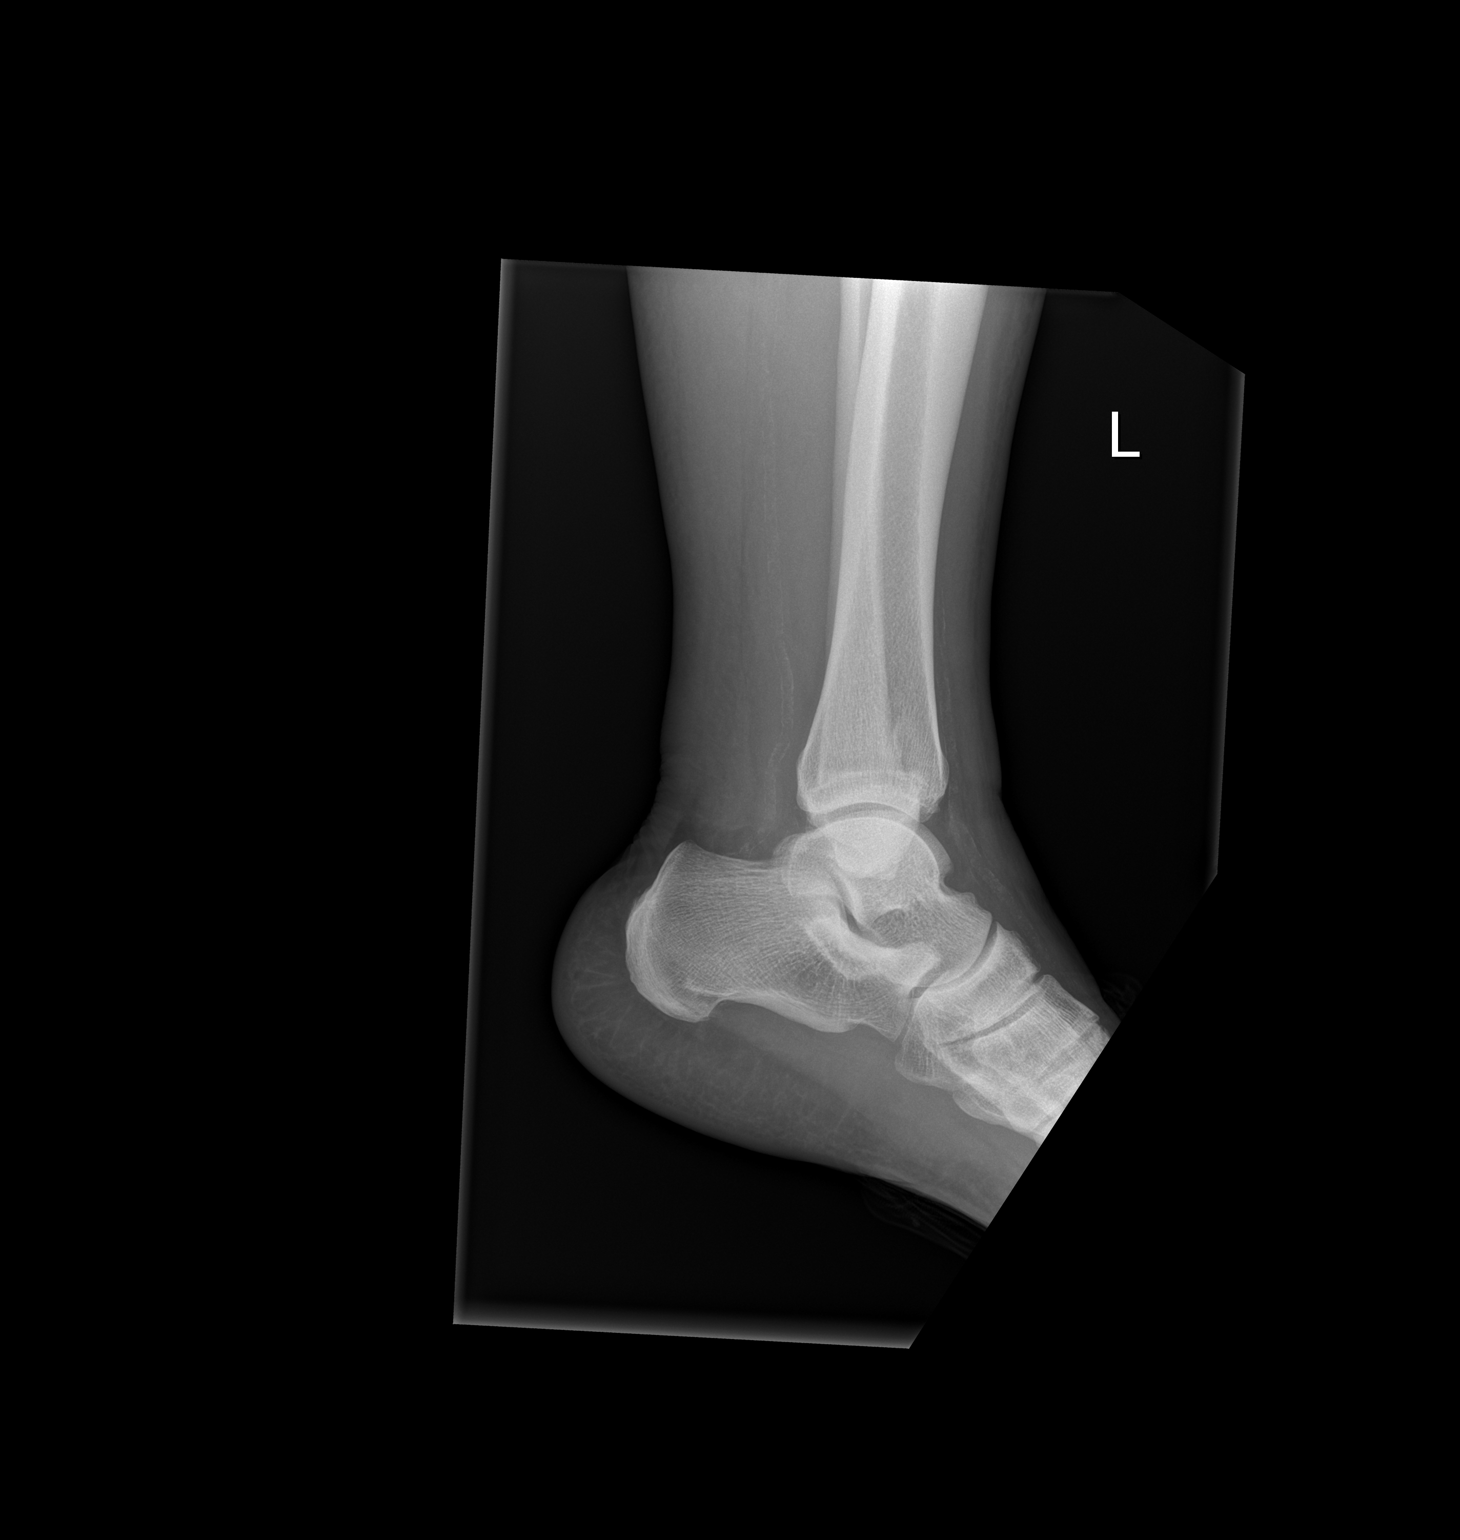

[3 of 3 positions shown; findings below may reference images not displayed]

FINDINGS: There is no evidence of fracture, dislocation, or joint effusion.
There is no evidence of arthropathy or other focal bone abnormality.
Soft tissues appear somewhat swollen.
IMPRESSION: Soft tissue swelling without underlying bony abnormality.

## 2023-09-02 ENCOUNTER — Inpatient Hospital Stay (HOSPITAL_COMMUNITY)
Admission: EM | Admit: 2023-09-02 | Discharge: 2023-09-16 | DRG: 871 | Disposition: A | Discharge status: Home / Self Care | Attending: Internal Medicine | Admitting: Internal Medicine

## 2023-09-02 ENCOUNTER — Other Ambulatory Visit: Payer: Self-pay

## 2023-09-02 ENCOUNTER — Emergency Department (HOSPITAL_COMMUNITY)

## 2023-09-02 DIAGNOSIS — E11649 Type 2 diabetes mellitus with hypoglycemia without coma: Secondary | ICD-10-CM | POA: Diagnosis not present

## 2023-09-02 DIAGNOSIS — E1010 Type 1 diabetes mellitus with ketoacidosis without coma: Principal | ICD-10-CM

## 2023-09-02 DIAGNOSIS — E785 Hyperlipidemia, unspecified: Secondary | ICD-10-CM | POA: Diagnosis present

## 2023-09-02 DIAGNOSIS — J9601 Acute respiratory failure with hypoxia: Secondary | ICD-10-CM | POA: Diagnosis not present

## 2023-09-02 DIAGNOSIS — G9341 Metabolic encephalopathy: Secondary | ICD-10-CM | POA: Diagnosis present

## 2023-09-02 DIAGNOSIS — Z91148 Patient's other noncompliance with medication regimen for other reason: Secondary | ICD-10-CM

## 2023-09-02 DIAGNOSIS — Z781 Physical restraint status: Secondary | ICD-10-CM

## 2023-09-02 DIAGNOSIS — E875 Hyperkalemia: Secondary | ICD-10-CM | POA: Diagnosis present

## 2023-09-02 DIAGNOSIS — E870 Hyperosmolality and hypernatremia: Secondary | ICD-10-CM | POA: Diagnosis present

## 2023-09-02 DIAGNOSIS — Y658 Other specified misadventures during surgical and medical care: Secondary | ICD-10-CM | POA: Diagnosis not present

## 2023-09-02 DIAGNOSIS — E860 Dehydration: Secondary | ICD-10-CM | POA: Diagnosis present

## 2023-09-02 DIAGNOSIS — J45909 Unspecified asthma, uncomplicated: Secondary | ICD-10-CM | POA: Diagnosis present

## 2023-09-02 DIAGNOSIS — K859 Acute pancreatitis without necrosis or infection, unspecified: Secondary | ICD-10-CM | POA: Diagnosis present

## 2023-09-02 DIAGNOSIS — E1110 Type 2 diabetes mellitus with ketoacidosis without coma: Secondary | ICD-10-CM | POA: Diagnosis present

## 2023-09-02 DIAGNOSIS — N9989 Other postprocedural complications and disorders of genitourinary system: Secondary | ICD-10-CM | POA: Diagnosis not present

## 2023-09-02 DIAGNOSIS — A419 Sepsis, unspecified organism: Principal | ICD-10-CM | POA: Diagnosis present

## 2023-09-02 DIAGNOSIS — Z7951 Long term (current) use of inhaled steroids: Secondary | ICD-10-CM

## 2023-09-02 DIAGNOSIS — G4733 Obstructive sleep apnea (adult) (pediatric): Secondary | ICD-10-CM | POA: Diagnosis present

## 2023-09-02 DIAGNOSIS — D649 Anemia, unspecified: Secondary | ICD-10-CM | POA: Diagnosis not present

## 2023-09-02 DIAGNOSIS — I82612 Acute embolism and thrombosis of superficial veins of left upper extremity: Secondary | ICD-10-CM | POA: Diagnosis not present

## 2023-09-02 DIAGNOSIS — M7989 Other specified soft tissue disorders: Secondary | ICD-10-CM | POA: Diagnosis not present

## 2023-09-02 DIAGNOSIS — Z7982 Long term (current) use of aspirin: Secondary | ICD-10-CM

## 2023-09-02 DIAGNOSIS — I10 Essential (primary) hypertension: Secondary | ICD-10-CM | POA: Diagnosis present

## 2023-09-02 DIAGNOSIS — J189 Pneumonia, unspecified organism: Secondary | ICD-10-CM | POA: Diagnosis present

## 2023-09-02 DIAGNOSIS — Z794 Long term (current) use of insulin: Secondary | ICD-10-CM | POA: Diagnosis not present

## 2023-09-02 DIAGNOSIS — R6520 Severe sepsis without septic shock: Secondary | ICD-10-CM | POA: Diagnosis present

## 2023-09-02 DIAGNOSIS — E1310 Other specified diabetes mellitus with ketoacidosis without coma: Secondary | ICD-10-CM | POA: Diagnosis not present

## 2023-09-02 DIAGNOSIS — R319 Hematuria, unspecified: Secondary | ICD-10-CM | POA: Diagnosis not present

## 2023-09-02 DIAGNOSIS — W19XXXA Unspecified fall, initial encounter: Secondary | ICD-10-CM | POA: Diagnosis not present

## 2023-09-02 DIAGNOSIS — Z79899 Other long term (current) drug therapy: Secondary | ICD-10-CM

## 2023-09-02 DIAGNOSIS — Z884 Allergy status to anesthetic agent status: Secondary | ICD-10-CM

## 2023-09-02 DIAGNOSIS — N179 Acute kidney failure, unspecified: Secondary | ICD-10-CM | POA: Diagnosis present

## 2023-09-02 DIAGNOSIS — R4182 Altered mental status, unspecified: Secondary | ICD-10-CM

## 2023-09-02 DIAGNOSIS — T68XXXA Hypothermia, initial encounter: Secondary | ICD-10-CM

## 2023-09-02 DIAGNOSIS — Z7984 Long term (current) use of oral hypoglycemic drugs: Secondary | ICD-10-CM

## 2023-09-02 DIAGNOSIS — R680 Hypothermia, not associated with low environmental temperature: Secondary | ICD-10-CM | POA: Diagnosis present

## 2023-09-02 LAB — CBC
HCT: 57.3 % — ABNORMAL HIGH (ref 39.0–52.0)
HCT: 49.6 % (ref 39.0–52.0)
Hemoglobin: 15.9 g/dL (ref 13.0–17.0)
Hemoglobin: 16.4 g/dL (ref 13.0–17.0)
MCH: 28.6 pg (ref 26.0–34.0)
MCH: 29 pg (ref 26.0–34.0)
MCHC: 27.7 g/dL — ABNORMAL LOW (ref 30.0–36.0)
MCHC: 33.1 g/dL (ref 30.0–36.0)
MCV: 103.1 fL — ABNORMAL HIGH (ref 80.0–100.0)
MCV: 87.8 fL (ref 80.0–100.0)
Platelets: 267 K/uL (ref 150–400)
Platelets: 209 K/uL (ref 150–400)
RBC: 5.56 MIL/uL (ref 4.22–5.81)
RBC: 5.65 MIL/uL (ref 4.22–5.81)
RDW: 13.1 % (ref 11.5–15.5)
RDW: 12 % (ref 11.5–15.5)
WBC: 23 K/uL — ABNORMAL HIGH (ref 4.0–10.5)
WBC: 17.6 K/uL — ABNORMAL HIGH (ref 4.0–10.5)
nRBC: 0 % (ref 0.0–0.2)
nRBC: 0 % (ref 0.0–0.2)

## 2023-09-02 LAB — I-STAT CHEM 8, ED
BUN: 120 mg/dL — ABNORMAL HIGH (ref 8–23)
Calcium, Ion: 1.15 mmol/L (ref 1.15–1.40)
Chloride: 101 mmol/L (ref 98–111)
Creatinine, Ser: 4.2 mg/dL — ABNORMAL HIGH (ref 0.61–1.24)
Glucose, Bld: >700 mg/dL (ref 70–99)
HCT: 52 % (ref 39.0–52.0)
Hemoglobin: 17.7 g/dL — ABNORMAL HIGH (ref 13.0–17.0)
Potassium: 6.3 mmol/L (ref 3.5–5.1)
Sodium: 135 mmol/L (ref 135–145)
TCO2: 10 mmol/L — ABNORMAL LOW (ref 22–32)

## 2023-09-02 LAB — URINALYSIS, ROUTINE W REFLEX MICROSCOPIC
Bacteria, UA: NONE SEEN
Bilirubin Urine: NEGATIVE
Glucose, UA: >=500 mg/dL — AB
Ketones, ur: 5 mg/dL — AB
Leukocytes,Ua: NEGATIVE
Nitrite: NEGATIVE
Protein, ur: 100 mg/dL — AB
Specific Gravity, Urine: 1.021 (ref 1.005–1.030)
pH: 5 (ref 5.0–8.0)

## 2023-09-02 LAB — BASIC METABOLIC PANEL WITH GFR
Anion gap: 36 — ABNORMAL HIGH (ref 5–15)
Anion gap: 31 — ABNORMAL HIGH (ref 5–15)
Anion gap: 20 — ABNORMAL HIGH (ref 5–15)
Anion gap: 17 — ABNORMAL HIGH (ref 5–15)
BUN: 120 mg/dL — ABNORMAL HIGH (ref 8–23)
BUN: 118 mg/dL — ABNORMAL HIGH (ref 8–23)
BUN: 110 mg/dL — ABNORMAL HIGH (ref 8–23)
BUN: 100 mg/dL — ABNORMAL HIGH (ref 8–23)
CO2: 8 mmol/L — ABNORMAL LOW (ref 22–32)
CO2: 10 mmol/L — ABNORMAL LOW (ref 22–32)
CO2: 19 mmol/L — ABNORMAL LOW (ref 22–32)
CO2: 24 mmol/L (ref 22–32)
Calcium: 9 mg/dL (ref 8.9–10.3)
Calcium: 9.1 mg/dL (ref 8.9–10.3)
Calcium: 9 mg/dL (ref 8.9–10.3)
Calcium: 9.7 mg/dL (ref 8.9–10.3)
Chloride: 92 mmol/L — ABNORMAL LOW (ref 98–111)
Chloride: 100 mmol/L (ref 98–111)
Chloride: 110 mmol/L (ref 98–111)
Chloride: 116 mmol/L — ABNORMAL HIGH (ref 98–111)
Creatinine, Ser: 5.11 mg/dL — ABNORMAL HIGH (ref 0.61–1.24)
Creatinine, Ser: 4.83 mg/dL — ABNORMAL HIGH (ref 0.61–1.24)
Creatinine, Ser: 3.95 mg/dL — ABNORMAL HIGH (ref 0.61–1.24)
Creatinine, Ser: 3.39 mg/dL — ABNORMAL HIGH (ref 0.61–1.24)
GFR, Estimated: 12 mL/min — ABNORMAL LOW (ref >60)
GFR, Estimated: 13 mL/min — ABNORMAL LOW (ref >60)
GFR, Estimated: 16 mL/min — ABNORMAL LOW (ref >60)
GFR, Estimated: 20 mL/min — ABNORMAL LOW (ref >60)
Glucose, Bld: >1200 mg/dL (ref 70–99)
Glucose, Bld: >1200 mg/dL (ref 70–99)
Glucose, Bld: 776 mg/dL (ref 70–99)
Glucose, Bld: 535 mg/dL (ref 70–99)
Potassium: 6.5 mmol/L (ref 3.5–5.1)
Potassium: 5 mmol/L (ref 3.5–5.1)
Potassium: 4.4 mmol/L (ref 3.5–5.1)
Potassium: 4.4 mmol/L (ref 3.5–5.1)
Sodium: 136 mmol/L (ref 135–145)
Sodium: 141 mmol/L (ref 135–145)
Sodium: 149 mmol/L — ABNORMAL HIGH (ref 135–145)
Sodium: 157 mmol/L — ABNORMAL HIGH (ref 135–145)

## 2023-09-02 LAB — TYPE AND SCREEN
ABO/RH(D): B POS
Antibody Screen: NEGATIVE

## 2023-09-02 LAB — GLUCOSE, CAPILLARY
Glucose-Capillary: 308 mg/dL — ABNORMAL HIGH (ref 70–99)
Glucose-Capillary: 351 mg/dL — ABNORMAL HIGH (ref 70–99)
Glucose-Capillary: 364 mg/dL — ABNORMAL HIGH (ref 70–99)
Glucose-Capillary: 406 mg/dL — ABNORMAL HIGH (ref 70–99)
Glucose-Capillary: 444 mg/dL — ABNORMAL HIGH (ref 70–99)
Glucose-Capillary: 490 mg/dL — ABNORMAL HIGH (ref 70–99)
Glucose-Capillary: 516 mg/dL (ref 70–99)
Glucose-Capillary: 590 mg/dL (ref 70–99)
Glucose-Capillary: >600 mg/dL (ref 70–99)
Glucose-Capillary: >600 mg/dL (ref 70–99)
Glucose-Capillary: >600 mg/dL (ref 70–99)
Glucose-Capillary: >600 mg/dL (ref 70–99)
Glucose-Capillary: >600 mg/dL (ref 70–99)
Glucose-Capillary: >600 mg/dL (ref 70–99)
Glucose-Capillary: >600 mg/dL (ref 70–99)
Glucose-Capillary: >600 mg/dL (ref 70–99)
Glucose-Capillary: >600 mg/dL (ref 70–99)
Glucose-Capillary: >600 mg/dL (ref 70–99)
Glucose-Capillary: >600 mg/dL (ref 70–99)
Glucose-Capillary: >600 mg/dL (ref 70–99)
Glucose-Capillary: >600 mg/dL (ref 70–99)

## 2023-09-02 LAB — LIPASE, BLOOD: Lipase: 2665 U/L — ABNORMAL HIGH (ref 11–51)

## 2023-09-02 LAB — CBC WITH DIFFERENTIAL/PLATELET
Abs Immature Granulocytes: 0.78 K/uL — ABNORMAL HIGH (ref 0.00–0.07)
Basophils Absolute: 0.1 K/uL (ref 0.0–0.1)
Basophils Relative: 0 %
Eosinophils Absolute: 0 K/uL (ref 0.0–0.5)
Eosinophils Relative: 0 %
HCT: 54.8 % — ABNORMAL HIGH (ref 39.0–52.0)
Hemoglobin: 15.3 g/dL (ref 13.0–17.0)
Immature Granulocytes: 3 %
Lymphocytes Relative: 2 %
Lymphs Abs: 0.4 K/uL — ABNORMAL LOW (ref 0.7–4.0)
MCH: 28.7 pg (ref 26.0–34.0)
MCHC: 27.9 g/dL — ABNORMAL LOW (ref 30.0–36.0)
MCV: 102.6 fL — ABNORMAL HIGH (ref 80.0–100.0)
Monocytes Absolute: 2.1 K/uL — ABNORMAL HIGH (ref 0.1–1.0)
Monocytes Relative: 9 %
Neutro Abs: 20.2 K/uL — ABNORMAL HIGH (ref 1.7–7.7)
Neutrophils Relative %: 86 %
Platelets: 256 K/uL (ref 150–400)
RBC: 5.34 MIL/uL (ref 4.22–5.81)
RDW: 13.1 % (ref 11.5–15.5)
WBC: 23.6 K/uL — ABNORMAL HIGH (ref 4.0–10.5)
nRBC: 0 % (ref 0.0–0.2)

## 2023-09-02 LAB — COMPREHENSIVE METABOLIC PANEL WITH GFR
ALT: 42 U/L (ref 0–44)
AST: 17 U/L (ref 15–41)
Albumin: 3.5 g/dL (ref 3.5–5.0)
Alkaline Phosphatase: 65 U/L (ref 38–126)
BUN: 122 mg/dL — ABNORMAL HIGH (ref 8–23)
CO2: <7 mmol/L — ABNORMAL LOW (ref 22–32)
Calcium: 8.9 mg/dL (ref 8.9–10.3)
Chloride: 91 mmol/L — ABNORMAL LOW (ref 98–111)
Creatinine, Ser: 5.1 mg/dL — ABNORMAL HIGH (ref 0.61–1.24)
GFR, Estimated: 12 mL/min — ABNORMAL LOW (ref >60)
Glucose, Bld: >1200 mg/dL (ref 70–99)
Potassium: 6.2 mmol/L — ABNORMAL HIGH (ref 3.5–5.1)
Sodium: 135 mmol/L (ref 135–145)
Total Bilirubin: 2.6 mg/dL — ABNORMAL HIGH (ref 0.0–1.2)
Total Protein: 7.6 g/dL (ref 6.5–8.1)

## 2023-09-02 LAB — CBG MONITORING, ED
Glucose-Capillary: >600 mg/dL (ref 70–99)
Glucose-Capillary: >600 mg/dL (ref 70–99)
Glucose-Capillary: >600 mg/dL (ref 70–99)
Glucose-Capillary: >600 mg/dL (ref 70–99)

## 2023-09-02 LAB — RAPID URINE DRUG SCREEN, HOSP PERFORMED
Amphetamines: NOT DETECTED
Barbiturates: NOT DETECTED
Benzodiazepines: NOT DETECTED
Cocaine: NOT DETECTED
Opiates: NOT DETECTED
Tetrahydrocannabinol: NOT DETECTED

## 2023-09-02 LAB — HIV ANTIBODY (ROUTINE TESTING W REFLEX): HIV Screen 4th Generation wRfx: NONREACTIVE

## 2023-09-02 LAB — I-STAT VENOUS BLOOD GAS, ED
Acid-base deficit: 22 mmol/L — ABNORMAL HIGH (ref 0.0–2.0)
Bicarbonate: 7.2 mmol/L — ABNORMAL LOW (ref 20.0–28.0)
Calcium, Ion: 1.06 mmol/L — ABNORMAL LOW (ref 1.15–1.40)
HCT: 49 % (ref 39.0–52.0)
Hemoglobin: 16.7 g/dL (ref 13.0–17.0)
O2 Saturation: 95 %
Potassium: 6.2 mmol/L — ABNORMAL HIGH (ref 3.5–5.1)
Sodium: 133 mmol/L — ABNORMAL LOW (ref 135–145)
TCO2: 8 mmol/L — ABNORMAL LOW (ref 22–32)
pCO2, Ven: 25.4 mmHg — ABNORMAL LOW (ref 44–60)
pH, Ven: 7.062 — CL (ref 7.25–7.43)
pO2, Ven: 108 mmHg — ABNORMAL HIGH (ref 32–45)

## 2023-09-02 LAB — HEPATIC FUNCTION PANEL
ALT: 39 U/L (ref 0–44)
AST: 18 U/L (ref 15–41)
Albumin: 3.3 g/dL — ABNORMAL LOW (ref 3.5–5.0)
Alkaline Phosphatase: 57 U/L (ref 38–126)
Bilirubin, Direct: <0.1 mg/dL (ref 0.0–0.2)
Total Bilirubin: 1.7 mg/dL — ABNORMAL HIGH (ref 0.0–1.2)
Total Protein: 6.8 g/dL (ref 6.5–8.1)

## 2023-09-02 LAB — ETHANOL: Alcohol, Ethyl (B): <15 mg/dL (ref <15)

## 2023-09-02 LAB — SAMPLE TO BLOOD BANK

## 2023-09-02 LAB — I-STAT CG4 LACTIC ACID, ED
Lactic Acid, Venous: 5.3 mmol/L (ref 0.5–1.9)
Lactic Acid, Venous: 5.1 mmol/L (ref 0.5–1.9)

## 2023-09-02 LAB — CK: Total CK: 277 U/L (ref 49–397)

## 2023-09-02 LAB — BETA-HYDROXYBUTYRIC ACID
Beta-Hydroxybutyric Acid: >8 mmol/L — ABNORMAL HIGH (ref 0.05–0.27)
Beta-Hydroxybutyric Acid: >8 mmol/L — ABNORMAL HIGH (ref 0.05–0.27)
Beta-Hydroxybutyric Acid: 3.81 mmol/L — ABNORMAL HIGH (ref 0.05–0.27)
Beta-Hydroxybutyric Acid: 6.33 mmol/L — ABNORMAL HIGH (ref 0.05–0.27)
Beta-Hydroxybutyric Acid: 1.61 mmol/L — ABNORMAL HIGH (ref 0.05–0.27)

## 2023-09-02 LAB — PROTIME-INR
INR: 1.3 — ABNORMAL HIGH (ref 0.8–1.2)
INR: 1.3 — ABNORMAL HIGH (ref 0.8–1.2)
Prothrombin Time: 16.5 s — ABNORMAL HIGH (ref 11.4–15.2)
Prothrombin Time: 16.4 s — ABNORMAL HIGH (ref 11.4–15.2)

## 2023-09-02 LAB — LACTIC ACID, PLASMA: Lactic Acid, Venous: 2.7 mmol/L (ref 0.5–1.9)

## 2023-09-02 LAB — MRSA NEXT GEN BY PCR, NASAL: MRSA by PCR Next Gen: NOT DETECTED

## 2023-09-02 LAB — ABO/RH: ABO/RH(D): B POS

## 2023-09-02 MED ORDER — ORAL CARE MOUTH RINSE: 15.0000 mL | OROMUCOSAL | Status: DC | PRN

## 2023-09-02 MED ORDER — LIDOCAINE HCL URETHRAL/MUCOSAL 2 % EX GEL
1.0000 | Freq: Once | CUTANEOUS | Status: AC
  Administered 2023-09-02: 1 via URETHRAL
  Filled 2023-09-02: qty 6

## 2023-09-02 MED ORDER — PROMETHAZINE (PHENERGAN) 6.25MG IN NS 50ML IVPB: 6.2500 mg | Freq: Four times a day (QID) | INTRAVENOUS | Status: DC | PRN

## 2023-09-02 MED ORDER — DOCUSATE SODIUM 100 MG PO CAPS: 100.0000 mg | ORAL_CAPSULE | Freq: Two times a day (BID) | ORAL | Status: DC | PRN

## 2023-09-02 MED ORDER — METRONIDAZOLE 500 MG/100ML IV SOLN
500.0000 mg | Freq: Once | INTRAVENOUS | Status: AC
  Administered 2023-09-02: 500 mg via INTRAVENOUS
  Filled 2023-09-02: qty 100

## 2023-09-02 MED ORDER — LACTATED RINGERS IV BOLUS
2000.0000 mL | Freq: Once | INTRAVENOUS | Status: AC
  Administered 2023-09-02: 2000 mL via INTRAVENOUS

## 2023-09-02 MED ORDER — LACTATED RINGERS IV BOLUS
1000.0000 mL | Freq: Once | INTRAVENOUS | Status: AC
  Administered 2023-09-02: 1000 mL via INTRAVENOUS

## 2023-09-02 MED ORDER — SODIUM CHLORIDE 0.9 % IV SOLN: 2.0000 g | INTRAVENOUS | Status: DC

## 2023-09-02 MED ORDER — CALCIUM GLUCONATE 10 % IV SOLN
1.0000 g | Freq: Once | INTRAVENOUS | Status: AC
  Administered 2023-09-02: 1 g via INTRAVENOUS
  Filled 2023-09-02: qty 10

## 2023-09-02 MED ORDER — SODIUM BICARBONATE 8.4 % IV SOLN
50.0000 meq | Freq: Once | INTRAVENOUS | Status: AC
  Administered 2023-09-02: 50 meq via INTRAVENOUS
  Filled 2023-09-02: qty 50

## 2023-09-02 MED ORDER — FLUTICASONE FUROATE-VILANTEROL 200-25 MCG/ACT IN AEPB
1.0000 | INHALATION_SPRAY | Freq: Every day | RESPIRATORY_TRACT | Status: DC
  Administered 2023-09-03 – 2023-09-05 (×3): 1 via RESPIRATORY_TRACT
  Filled 2023-09-02: qty 28

## 2023-09-02 MED ORDER — ONDANSETRON HCL 4 MG/2ML IJ SOLN
4.0000 mg | Freq: Four times a day (QID) | INTRAMUSCULAR | Status: DC | PRN
  Administered 2023-09-02 – 2023-09-08 (×2): 4 mg via INTRAVENOUS
  Filled 2023-09-02 (×2): qty 2

## 2023-09-02 MED ORDER — PANTOPRAZOLE SODIUM 40 MG IV SOLR
40.0000 mg | Freq: Two times a day (BID) | INTRAVENOUS | Status: DC
  Administered 2023-09-02 – 2023-09-05 (×6): 40 mg via INTRAVENOUS
  Filled 2023-09-02 (×5): qty 10

## 2023-09-02 MED ORDER — DEXTROSE 50 % IV SOLN: 0.0000 mL | INTRAVENOUS | Status: DC | PRN

## 2023-09-02 MED ORDER — SODIUM CHLORIDE 0.9 % IV SOLN
2.0000 g | Freq: Once | INTRAVENOUS | Status: AC
  Administered 2023-09-02: 2 g via INTRAVENOUS
  Filled 2023-09-02: qty 20

## 2023-09-02 MED ORDER — INSULIN REGULAR(HUMAN) IN NACL 100-0.9 UT/100ML-% IV SOLN
INTRAVENOUS | Status: DC
  Administered 2023-09-02: 7 [IU]/h via INTRAVENOUS
  Administered 2023-09-02: 8 [IU]/h via INTRAVENOUS
  Filled 2023-09-02 (×2): qty 100

## 2023-09-02 MED ORDER — PIPERACILLIN-TAZOBACTAM 3.375 G IVPB
3.3750 g | Freq: Three times a day (TID) | INTRAVENOUS | Status: DC
  Administered 2023-09-02 – 2023-09-03 (×4): 3.375 g via INTRAVENOUS
  Filled 2023-09-02 (×4): qty 50

## 2023-09-02 MED ORDER — CHLORHEXIDINE GLUCONATE CLOTH 2 % EX PADS
6.0000 | MEDICATED_PAD | Freq: Every day | CUTANEOUS | Status: DC
  Administered 2023-09-02 – 2023-09-03 (×2): 6 via TOPICAL

## 2023-09-02 MED ORDER — DEXTROSE IN LACTATED RINGERS 5 % IV SOLN: INTRAVENOUS | Status: DC

## 2023-09-02 MED ORDER — LACTATED RINGERS IV SOLN: INTRAVENOUS | Status: DC

## 2023-09-02 MED ORDER — HEPARIN SODIUM (PORCINE) 5000 UNIT/ML IJ SOLN
5000.0000 [IU] | Freq: Three times a day (TID) | INTRAMUSCULAR | Status: DC
  Administered 2023-09-02 – 2023-09-03 (×3): 5000 [IU] via SUBCUTANEOUS
  Filled 2023-09-02 (×3): qty 1

## 2023-09-02 MED ORDER — POLYETHYLENE GLYCOL 3350 17 G PO PACK: 17.0000 g | PACK | Freq: Every day | ORAL | Status: DC | PRN

## 2023-09-02 NOTE — Progress Notes (Signed)
 Responded to page to support pt that had a major fall injury.  Chaplain provided emotional and spiritual support.  Pt going for CT.  Chaplain available as needed.  Rayleen Levander Com[place] lain, [facility], Pager [phone]

## 2023-09-02 NOTE — Plan of Care (Signed)

## 2023-09-02 NOTE — ED Triage Notes (Signed)
 Pt BIB [facility] as Level 1 fall. EMS reports pt's wife heard him fall & when she got to him he was alert & asking her help to get up, then progressively became more lethargic & then unresponsive. EMS reports GCS of 3 or 4 with them, CBG 307, C-collar applied, not on thinners, received 800cc LR via 18g Lt AC PIV. Wife reports he has 20-30 lbs the last couple of weeks, has had no BM in the last 2 wks. NSR on their monitor at 70 bpm, 113/40.

## 2023-09-02 NOTE — ED Provider Notes (Signed)
 [place] [facility] AT [facility] Provider Note   CSN: [id number] Arrival date & time: 09/02/23  9096     Patient presents with: No chief complaint on file.   Jack Cunningham is a 63 y.o. male.   Pt brought in as a level 1 trauma.  Patient's history is by EMS who reports that patient's wife called them after she heard patient fell to the floor.  Patient was reported to be unresponsive with a GCS of 3.  Patient is reported to have a past medical history of diabetes and hypertension.  Patient is not on any blood thinners.  Patient is currently awake and can give his name.  Patient denies any area of pain.  Patient is evaluated by the trauma service nurse and physician.   Trauma Mechanism of injury: Fall Injury location: head/neck Time since incident: 1 hour Arrived directly from scene: yes   Fall:      Fall occurred: standing      Impact surface: hard floor      Point of impact: unknown      Entrapped after fall: no      Suspicion of alcohol use: no      Suspicion of drug use: no  EMS/PTA data:      Ambulatory at scene: no      Blood loss: none      Responsiveness: unresponsive      IV access: established     Prior to Admission medications   Not on File    Allergies: Patient has no allergy information on record.    Review of Systems  Constitutional:  Negative for fever.  Genitourinary:  Positive for frequency.  All other systems reviewed and are negative.   Updated Vital Signs BP (!) 108/50   Pulse 72   Resp 17   Ht 5' 10 (1.778 m)   SpO2 100%   Physical Exam Vitals and nursing note reviewed.  Constitutional:      Appearance: He is well-developed.  HENT:     Head: Normocephalic and atraumatic.     Ears:     Comments: Wax bilat canals, clear fluid    Nose: Nose normal.     Mouth/Throat:     Mouth: Mucous membranes are moist.     Pharynx: Oropharynx is clear.  Cardiovascular:     Rate and Rhythm: Normal rate and regular  rhythm.  Pulmonary:     Effort: Pulmonary effort is normal.  Abdominal:     General: Abdomen is flat. There is no distension.     Palpations: Abdomen is soft.     Tenderness: There is no abdominal tenderness.  Musculoskeletal:        General: Normal range of motion.     Cervical back: No tenderness.  Skin:    General: Skin is warm.     Comments: cool  Neurological:     General: No focal deficit present.     Mental Status: He is alert and oriented to person, place, and time.  Psychiatric:        Mood and Affect: Mood normal.     (all labs ordered are listed, but only abnormal results are displayed) Labs Reviewed  CBC - Abnormal; Notable for the following components:      Result Value   WBC 23.0 (*)    HCT 57.3 (*)    MCV 103.1 (*)    MCHC 27.7 (*)    All other components within normal limits  PROTIME-INR - Abnormal; Notable for the following components:   Prothrombin Time 16.5 (*)    INR 1.3 (*)    All other components within normal limits  I-STAT CHEM 8, ED - Abnormal; Notable for the following components:   Potassium 6.3 (*)    BUN 120 (*)    Creatinine, Ser 4.20 (*)    Glucose, Bld >700 (*)    TCO2 10 (*)    Hemoglobin 17.7 (*)    All other components within normal limits  I-STAT CG4 LACTIC ACID, ED - Abnormal; Notable for the following components:   Lactic Acid, Venous 5.3 (*)    All other components within normal limits  CBG MONITORING, ED - Abnormal; Notable for the following components:   Glucose-Capillary >600 (*)    All other components within normal limits  CULTURE, BLOOD (ROUTINE X 2)  CULTURE, BLOOD (ROUTINE X 2)  COMPREHENSIVE METABOLIC PANEL WITH GFR  URINALYSIS, ROUTINE W REFLEX MICROSCOPIC  RAPID URINE DRUG SCREEN, HOSP PERFORMED  ETHANOL  BETA-HYDROXYBUTYRIC ACID  BASIC METABOLIC PANEL WITH GFR  BASIC METABOLIC PANEL WITH GFR  BASIC METABOLIC PANEL WITH GFR  BASIC METABOLIC PANEL WITH GFR  BETA-HYDROXYBUTYRIC ACID  BETA-HYDROXYBUTYRIC ACID   BETA-HYDROXYBUTYRIC ACID  BETA-HYDROXYBUTYRIC ACID  CBC WITH DIFFERENTIAL/PLATELET  CK  I-STAT CHEM 8, ED  I-STAT CG4 LACTIC ACID, ED  I-STAT VENOUS BLOOD GAS, ED  SAMPLE TO BLOOD BANK    EKG: EKG Interpretation Date/Time:  Thursday September 02 2023 09:11:49 EDT Ventricular Rate:  73 PR Interval:  186 QRS Duration:  87 QT Interval:  440 QTC Calculation: 485 R Axis:   60  Text Interpretation: Sinus rhythm Non-specific ST-t changes Baseline wander No previous tracing Confirmed by Bernard Drivers ([id number]) on 09/02/2023 9:31:35 AM  Radiology: No results found.   .Critical Care  Performed by: Flint Sonny POUR, PA-C Authorized by: Flint Sonny POUR, PA-C   Critical care provider statement:    Critical care time (minutes):  74   Critical care start time:  09/02/2023 9:00 AM   Critical care end time:  09/02/2023 10:51 AM   Critical care time was exclusive of:  Separately billable procedures and treating other patients and teaching time   Critical care was necessary to treat or prevent imminent or life-threatening deterioration of the following conditions:  Endocrine crisis, dehydration and CNS failure or compromise   Critical care was time spent personally by me on the following activities:  Development of treatment plan with patient or surrogate, discussions with consultants, evaluation of patient's response to treatment, examination of patient, ordering and review of laboratory studies, ordering and review of radiographic studies, ordering and performing treatments and interventions, pulse oximetry, re-evaluation of patient's condition and review of old charts   Care discussed with: admitting provider      Medications Ordered in the ED  calcium gluconate inj 10% (1 g) URGENT USE ONLY! (has no administration in time range)  lactated ringers bolus 1,000 mL (has no administration in time range)  insulin regular, human (MYXREDLIN) 100 units/ 100 mL infusion (has no administration in time range)   lactated ringers infusion (has no administration in time range)  dextrose 5 % in lactated ringers infusion (has no administration in time range)  dextrose 50 % solution 0-50 mL (has no administration in time range)  lactated ringers bolus 1,000 mL (1,000 mLs Intravenous New Bag/Given 09/02/23 0909)    Clinical Course as of 09/02/23 0941  Thu Sep 02, 2023  0927 Lactic Acid, Venous(!!):  5.3 [MC]  0928 TCO2(!): 10 [MC]  0929 Comment: NOTIFIED PHYSICIAN [MC]  0929 TCO2(!): 10 [MC]  0939 SpO2(!): 80 % [MC]    Clinical Course User Index [MC] Mayo Speaker, Student-PA                                 Medical Decision Making Patient is brought to the emergency department as a level 1 trauma after a fall at home.  Patient is reported to have had a Glascow coma score of 3.  Patient is more awake on his arrival he is able to give his name he denies any area of pain.  Amount and/or Complexity of Data Reviewed Independent Historian: EMS    Details: Patient's history is provided by EMS.  Patient's wife reports that she heard patient fall to the ground Labs: ordered. Decision-making details documented in ED Course.    Details: Labs ordered reviewed and interpreted.  Patient's glucose is over 700.  Lactic acid is 5.3.  BUN is 120 creatinine is 4.20 bicarb is less than 10.  White blood cell count is 23 INR is 1.3. Radiology: ordered and independent interpretation performed. Decision-making details documented in ED Course. ECG/medicine tests: ordered.    Details: EKG normal sinus rhythm no acute findings Discussion of management or test interpretation with external provider(s): Critical care consulted for admission   Risk Prescription drug management. Decision regarding hospitalization.        Final diagnoses:  Diabetic ketoacidosis without coma associated with type 1 diabetes mellitus (HCC)  Altered mental status, unspecified altered mental status type  Hypothermia, initial encounter  Fall,  initial encounter    ED Discharge Orders     None          Malcom Selmer K, PA-C 09/02/23 1307    Bernard Drivers, MD 09/02/23 1314

## 2023-09-02 NOTE — ED Notes (Signed)
 X-ray at bedside

## 2023-09-02 NOTE — Progress Notes (Signed)
 Orthopedic Tech Progress Note Patient Details:  Jack Cunningham Jun 22, 1960 [id number] Level 1 Trauma. Not needed Patient ID: Jack Cunningham, male   DOB: [birth year], 63 y.o.   MRN: [id number]  Jack Cunningham Jack Cunningham 09/02/2023, 10:07 AM

## 2023-09-02 NOTE — ED Notes (Signed)
 Trauma Response Nurse Documentation  Sekou Zuckerman is a 63 y.o. male arriving to [facility] ED via [facility]  Trauma was activated as a Level 1 based on the following trauma criteria GCS < 9.  Patient cleared for CT by Dr. Sebastian. Pt transported to CT with trauma response nurse present to monitor. RN remained with the patient throughout their absence from the department for clinical observation.   GCS reported by EMS 3, now improved to 13.  Trauma MD Arrival Time: patient arrival, 76.  History   No past medical history on file.      Initial Focused Assessment (If applicable, or please see trauma documentation): Patient alert to voice, oriented to name, PERR 4, GCS 13 Airway intact, bilateral breath sounds Pulses 2+  CT's Completed:   CT Head, CT C-Spine, CT Chest w/ contrast, and CT abdomen/pelvis w/ contrast   Interventions:  IV, labs, cultures CXR/PXR CT Head/Cspine/C/A/P Insulin gtt Calcium Bicarb Bairhugger 1L NS 2L LR  Plan for disposition:  Admission to ICU   Consults completed:  PCCM for admission.  Event Summary: Patient to ED after a fall, wife heard him and called EMS. Unknown if patient hit his head. On EMS arrival GCS 3, improving to 13 after arrival to ED. Per patient he has not been sick, however wife states patient has not felt well for weeks. Recent diabetes diagnosis. Unable to provide accurate medical history due to confusion. Imaging was ordered and revealed no traumatic injury, labs were ordered and revealed DKA. Patient was also hypothermic requiring bairhugger, hypocalcemic, hyperkalemic. Patient was started on an insulin gtt, given bicarb and calcium. Plan for PCCM admission.  Bedside handoff with ED RN Carlyon.    Alan CROME Kaylon Hitz  Trauma Response RN  Please call TRN at [phone] for further assistance.

## 2023-09-02 NOTE — Progress Notes (Signed)
 RT responded to level 1 unresponsive. Patient placed on 2L nasal cannula. Sats 100%. Vitals stable. RT not needed at this time.

## 2023-09-02 NOTE — Consult Note (Signed)
 Consult Note  Jack Cunningham 1960-05-02  [id number].    Chief Complaint/Reason for Consult: level 1 trauma - unwitnessed fall, GCS3  HPI:  Patient is a 63 year old male who was brought in as a level 1 trauma s/p unwitnessed fall and GCS3 per EMS. Per report, his wife was in the other room and heard a thud. She found him in the other room and he was initially asking for help to get up and then became less responsive. She reported to EMS that he is diabetic and has been urinating a lot lately, also reported 20-30 lbs weight loss in the last few weeks and no BM for 2 weeks. On arrival patient was a GCS 14 ([id number]). He was moving all extremities, able to follow commands and able to tell us  his name. Vitals stable. CBG for EMS in the 300s. Patient denied pain. NKDA and not taking anticoagulants.   ROS: Negative other than HPI.   No family history on file.  No past medical history on file.   Social History:  has no history on file for tobacco use, alcohol use, and drug use.  Allergies: Not on File  (Not in a hospital admission)   Blood pressure (!) 108/50, pulse 72, resp. rate 17, height 5' 10 (1.778 m), weight 93 kg, SpO2 100%. Physical Exam:  General: WD male, lethargic but awakens to voice and able to follow commands HEENT: head is normocephalic, atraumatic.  Sclera are noninjected.  PERRL.  Ears with some watery fluid present on the R, cerumen impaction.  Mouth is pink and moist Neck: collar present Heart: regular, rate, and rhythm.  Normal s1,s2. No obvious murmurs, gallops, or rubs noted.  Palpable radial and pedal pulses bilaterally Lungs: CTAB, no wheezes, rhonchi, or rales noted.  Respiratory effort nonlabored Abd: soft, NT, ND, no masses, hernias, or organomegaly MS: all 4 extremities are symmetrical with no cyanosis, clubbing, or edema. No TTP of spine and no step offs Skin: warm and dry with no masses, lesions, or rashes Neuro: GCS 14 ([id number]), moving all  extremities    Results for orders placed or performed during the hospital encounter of 09/02/23 (from the past 48 hours)  CBC     Status: Abnormal   Collection Time: 09/02/23  9:09 AM  Result Value Ref Range   WBC 23.0 (H) 4.0 - 10.5 K/uL   RBC 5.56 4.22 - 5.81 MIL/uL   Hemoglobin 15.9 13.0 - 17.0 g/dL   HCT 42.6 (H) 60.9 - 47.9 %   MCV 103.1 (H) 80.0 - 100.0 fL   MCH 28.6 26.0 - 34.0 pg   MCHC 27.7 (L) 30.0 - 36.0 g/dL   RDW 86.8 88.4 - 84.4 %   Platelets 267 150 - 400 K/uL    Comment: REPEATED TO VERIFY   nRBC 0.0 0.0 - 0.2 %    Comment: Performed at [facility], [address] [place], [place] [address]  Protime-INR     Status: Abnormal   Collection Time: 09/02/23  9:09 AM  Result Value Ref Range   Prothrombin Time 16.5 (H) 11.4 - 15.2 seconds   INR 1.3 (H) 0.8 - 1.2    Comment: (NOTE) INR goal varies based on device and disease states. Performed at [facility], [address] [place], [place] [address]   Sample to Blood Bank     Status: None   Collection Time: 09/02/23  9:10 AM  Result Value Ref Range  Blood Bank Specimen SAMPLE AVAILABLE FOR TESTING    Sample Expiration      09/05/2023,2359 Performed at [facility], [address], [place], [place] [address]   I-stat chem 8, ED     Status: Abnormal   Collection Time: 09/02/23  9:14 AM  Result Value Ref Range   Sodium 135 135 - 145 mmol/L   Potassium 6.3 (HH) 3.5 - 5.1 mmol/L   Chloride 101 98 - 111 mmol/L   BUN 120 (H) 8 - 23 mg/dL   Creatinine, Ser 5.79 (H) 0.61 - 1.24 mg/dL   Glucose, Bld >299 (HH) 70 - 99 mg/dL    Comment: Glucose reference range applies only to samples taken after fasting for at least 8 hours.   Calcium, Ion 1.15 1.15 - 1.40 mmol/L   TCO2 10 (L) 22 - 32 mmol/L   Hemoglobin 17.7 (H) 13.0 - 17.0 g/dL   HCT 47.9 60.9 - 47.9 %   Comment NOTIFIED PHYSICIAN   I-Stat Lactic Acid     Status: Abnormal   Collection Time: 09/02/23  9:15 AM  Result Value Ref Range   Lactic  Acid, Venous 5.3 (HH) 0.5 - 1.9 mmol/L   Comment NOTIFIED PHYSICIAN   CBG monitoring, ED     Status: Abnormal   Collection Time: 09/02/23  9:34 AM  Result Value Ref Range   Glucose-Capillary >600 (HH) 70 - 99 mg/dL    Comment: Glucose reference range applies only to samples taken after fasting for at least 8 hours.   Comment 1 Notify RN    Comment 2 Document in Chart    CT Cervical Spine Wo Contrast Result Date: 09/02/2023 CLINICAL DATA:  63 year old male status post fall.  Weakness. EXAM: CT CERVICAL SPINE WITHOUT CONTRAST TECHNIQUE: Multidetector CT imaging of the cervical spine was performed without intravenous contrast. Multiplanar CT image reconstructions were also generated. RADIATION DOSE REDUCTION: This exam was performed according to the departmental dose-optimization program which includes automated exposure control, adjustment of the mA and/or kV according to patient size and/or use of iterative reconstruction technique. COMPARISON:  Head CT today. FINDINGS: Alignment: Straightening of cervical lordosis. Cervicothoracic junction alignment is within normal limits. Bilateral posterior element alignment is within normal limits. Skull base and vertebrae: Bone mineralization is within normal limits. Visualized skull base is intact. No atlanto-occipital dissociation. C1 and C2 appear intact and aligned. No acute osseous abnormality identified. Soft tissues and spinal canal: No prevertebral fluid or swelling. No visible canal hematoma. Right nasal airway in place with no adverse features. Elongated bilateral stylohyoid ligament calcification (can predispose to Eagle syndrome). Negative other visible noncontrast neck soft tissues. Disc levels:  Ordinary cervical spine degeneration, age-appropriate. Upper chest: Visible upper thoracic levels appear intact. Lung apices are clear. Negative visible noncontrast thoracic inlet. IMPRESSION: No acute traumatic injury identified. Negative for age CT appearance  of the cervical spine. Study discussed by telephone with Dr. Dann Hummer on 09/02/2023 at 09:37 . Electronically Signed   By: VEAR Hurst M.D.   On: 09/02/2023 09:38   CT Head Wo Contrast Result Date: 09/02/2023 CLINICAL DATA:  63 year old male status post fall. Weakness. EXAM: CT HEAD WITHOUT CONTRAST TECHNIQUE: Contiguous axial images were obtained from the base of the skull through the vertex without intravenous contrast. RADIATION DOSE REDUCTION: This exam was performed according to the departmental dose-optimization program which includes automated exposure control, adjustment of the mA and/or kV according to patient size and/or use of iterative reconstruction technique. COMPARISON:  None Available.  FINDINGS: Brain: Cerebral volume is within normal limits for age. No midline shift, ventriculomegaly, mass effect, evidence of mass lesion, intracranial hemorrhage or evidence of cortically based acute infarction. Gray-white matter differentiation is largely normal for age, minimal periventricular white matter hypodensity. Vascular: Calcified atherosclerosis at the skull base. No suspicious intracranial vascular hyperdensity. Skull: Intact.  No acute osseous abnormality identified. Sinuses/Orbits: Right nasal airway in place. Paranasal sinuses and mastoids are well aerated. Other: No acute orbit or scalp soft tissue injury identified. IMPRESSION: No acute intracranial abnormality or acute traumatic injury identified. Right nasal airway in place. Electronically Signed   By: VEAR Hurst M.D.   On: 09/02/2023 09:34   DG Pelvis Portable Result Date: 09/02/2023 CLINICAL DATA:  63 year old male status post fall. Weakness. EXAM: PORTABLE PELVIS 1-2 VIEWS COMPARISON:  None Available. FINDINGS: AP view of the pelvis at 0914 hours. Bone mineralization is within normal limits for age. Femoral heads normally located. Pelvis appears intact. SI joints and pubic symphysis appear normal. Grossly intact proximal femurs. Normal visible  bowel gas pattern. Dystrophic and/or vascular calcifications incidentally noted below the right pubic rami. IMPRESSION: No acute fracture or dislocation identified about the pelvis. Electronically Signed   By: VEAR Hurst M.D.   On: 09/02/2023 09:34   DG Chest Port 1 View Result Date: 09/02/2023 CLINICAL DATA:  63 year old male status post fall. Weakness. EXAM: PORTABLE CHEST 1 VIEW COMPARISON:  Chest radiographs 04/27/2018 and earlier. FINDINGS: Portable AP views at 0912 hours. Stable lung volumes. Normal cardiac size and mediastinal contours. Visualized tracheal air column is within normal limits. Allowing for portable technique the lungs are clear. No pneumothorax or pleural effusion identified. Negative visible bowel gas. No acute osseous abnormality identified. IMPRESSION: No acute cardiopulmonary abnormality or acute traumatic injury identified. Electronically Signed   By: VEAR Hurst M.D.   On: 09/02/2023 09:33      Assessment/Plan Unwitnessed Fall with GCS of 3-4 for EMS - GCS improved by arrival - trauma scans reviewed, no acute traumatic injuries. C-collar removed - CBG 700, appears to be in DKA - recommend medical admission - trauma will not follow acutely   I reviewed last 24 h vitals and pain scores, last 48 h intake and output, last 24 h labs and trends, and last 24 h imaging results.  This care required high  level of medical decision making.   Burnard JONELLE Louder, [facility] 09/02/2023, 9:44 AM Please see Amion for pager number during day hours 7:00am-4:30pm

## 2023-09-02 NOTE — Progress Notes (Signed)
 E-linked called due to pt being restless in bed and pulling lines.

## 2023-09-02 NOTE — ED Notes (Signed)
 Patient transported to CT with TRN, primary RN & Sebastian, MD.

## 2023-09-02 NOTE — Progress Notes (Signed)
 E-linked called due to pt foley leaking

## 2023-09-02 NOTE — ED Notes (Signed)
 Admitting provider at bedside.

## 2023-09-02 NOTE — Progress Notes (Addendum)
 eLink Physician-Brief Progress Note Patient Name: Jack Cunningham DOB: 1960/03/17 MRN: [id number]   Date of Service  09/02/2023  HPI/Events of Note  63 year old male with a history of diabetes and hypertension and was brought in with new onset diabetic ketoacidosis and potential sepsis secondary to urinary source  Patient is delirious and attempting to get out of bed and pulling at IVs  eICU Interventions  Wrist restraints for patient safety   0147 -Endo tool recommending transition off of insulin  drip.  Lipase elevated, will check triglycerides and wait for next BMP/beta-hydroxybutyrate prior to transition.  9746 - Hypernatremia to 162, euvolemic Minor free water deficit.  Relative hypernatremia down from 165 corrected from previous hyperglycemia.  Switch D5 LR and LR to dextrose  infusions.  Aim for correction of 8-10 mEq in 24 hours.  Once gap is closed, will need to transition to insulin  drip for hyperglycemia in the setting of free water infusion  0345 - Repeat episode of coffee ground emesis. Also has bloody urine since catheter exchange.  Trend sodiums, coagulation studies, trend hemoglobin     0406 - Transition to endotool for hyperglycemia    Intervention Category Minor Interventions: Agitation / anxiety - evaluation and management  Jess Sulak 09/02/2023, 9:06 PM

## 2023-09-02 NOTE — Progress Notes (Signed)
 Notified of coffee ground emesis while pt was turned on his side.  Transient desat to 89, now on 2L  -pt will awakens, opens eyes, and protecting airway.  No upper airway sounds, breath sounds still clear.  On zosyn empirically  - start PPI, trend H/H, zofran prn, and send T&S - otherwise, good UOP ~2L, BHA/ AG improving, BG now 776, K 4.6, AKI improving.  - cont aspiration precautions, airway watch      Lyle Pesa, MSN, AG-ACNP-BC [facility] 09/02/2023, 6:13 PM  See Amion for pager If no response to pager , please call [phone] until 7pm After 7:00 pm call Elink  [phone]

## 2023-09-02 NOTE — Progress Notes (Signed)
 Foley exchange at bedside

## 2023-09-02 NOTE — H&P (Signed)
 NAME:  Jack Cunningham, MRN:  [id number], DOB:  [birth year], LOS: 0 ADMISSION DATE:  09/02/2023, CONSULTATION DATE:  09/02/23 REFERRING MD:  Flint POUR, PA, CHIEF COMPLAINT:  AMS   History of Present Illness:  HPI obtained from wife at bedside as pt remains encephalopathic.   19 yoM with PMH of recent dx of DM, HTN, asthma, OSA who presented by EMS from home after wife heard a thud, found on the floor asking for help up, progressively became unresponsive, GCS 3-4 with EMS.  Not on blood thinners. Given GCS and fall, was leveled as a trauma.  Wife reports he has not felt well for several weeks.  Recently diagnosed with DM ~ 1 month ago but has been unable to check CBGs at home due to meter error.  Wife reports polydipsia but otherwise poor PO intake, polyuria but over several days unable to void due to burning, some blurred vision, no BM in 2 weeks, and recent unintentional 28lb weight loss.  Episode of vomiting last evening.  EMS CBG 300s.  Wife denies any tobacco, ETOH, or illicit drug use.  In ER pt was more responsive, oriented to self, following simple commands, hemodynamically stable and normoxia otherwise.  Temp 90 currently on bairhugger.  Trauma scan negative for acute injuries- CTH/ cervical, XR pelvis, and CXR neg.  Labs noted for glucose > 1200, bicarb 8, BUN/ sCr 120/5.1, CK 277, lactic 5.3> 5.1, WBC 23.6, INR 1.3, iCa 1.06, ETOH neg. EKG NSR w/o acute ST changes.  ABG 7.062/ 25/ 108/ 7.2.  Treated with insulin gtt, getting 2L LR, bicarb, cultures sent and started on empiric ceftriaxone and flagyl.  UA pending.  Pt remains lethargic but oriented to self, following simple commands and denies pain.  PCCM called for admit.   Of note, additional chart not merged thus far.  MRN [id number]  Pertinent  Medical History  HTN, HLD, DM, asthma, OSA, ?MI at 78- wife unclear   Significant Hospital Events: Including procedures, antibiotic start and stop dates in addition to other pertinent events   7/3  Admit DKA  Interim History / Subjective:   Objective    Blood pressure 138/60, pulse 81, temperature (!) 90.5 F (32.5 C), temperature source Rectal, resp. rate (!) 23, height 5' 10 (1.778 m), weight 93 kg, SpO2 100%.       No intake or output data in the 24 hours ending 09/02/23 1133 Filed Weights   09/02/23 0940  Weight: 93 kg    Examination: General:  ill appearing older male lying in bed in NAD HEENT: MM pink/dry, pupils 3/r Neuro: lethargic, opens eyes to verbal, follows simple commands, MAE  CV: rr, NSR, no murmur PULM:  non labored/ deep, clear, diminished in bases GI: soft, bs+, NT Extremities: warm/dry, no LE edema  Skin: no rashes   Resolved problem list   Assessment and Plan   Recently dx DM now with DKA R/o sepsis, possible urinary Severe AGMA/ lactic acidosis  Hyperkalemia without EKG changes 2/2 DKA Hypocalcemia  AKI 2/2 to severe dehydration +/- ARB P:  - admit to ICU for close monitoring - currently protecting airway, continue to monitor - NPO, serial exams - goal MAP > 65, remains normotensive - warming measures prn  - insulin gtt per endotool  - finishing 2L LR, give another 2L then IVF per protocol  - check A1c/ consult to DM coordinator  - q4hr BMET/ BHA - pending UA/ UDS - insert foley for close monitoring - trend CBC/ renal  indices - if sCr not improving> proceed w/ lytes and US  to r/o obstruction/ hydronephrosis  - cont empiric ceftriaxone for now till further UA.  Consider abd imaging if lactic remains elevated but neg abd exam.  Suspect constipation in severe dehydration/ poor PO intake - follow cultures/ trend clinically  - hold pta metformin/ glipizide   Unwitnessed fall - trauma scans neg, cleared  HTN HLD - hold pta losartan and statin  OSA - wife unsure of hx, does not use CPAP  Asthma - no wheezing, normoxia - symbicort   Best Practice (right click and Reselect all SmartList Selections daily)   Diet/type:  NPO DVT prophylaxis prophylactic heparin  Pressure ulcer(s): N/A GI prophylaxis: N/A Lines: N/A Foley:  N/A Code Status:  full code Last date of multidisciplinary goals of care discussion [7/3]  Wife and daughter updated at bedside.   Labs   CBC: Recent Labs  Lab 09/02/23 0909 09/02/23 0914 09/02/23 0950 09/02/23 1006  WBC 23.0*  --  23.6*  --   NEUTROABS  --   --  20.2*  --   HGB 15.9 17.7* 15.3 16.7  HCT 57.3* 52.0 54.8* 49.0  MCV 103.1*  --  102.6*  --   PLT 267  --  256  --     Basic Metabolic Panel: Recent Labs  Lab 09/02/23 0909 09/02/23 0914 09/02/23 0950 09/02/23 1006  NA 135 135 136 133*  K 6.2* 6.3* 6.5* 6.2*  CL 91* 101 92*  --   CO2 <7*  --  8*  --   GLUCOSE >1,200* >700* >1,200*  --   BUN 122* 120* 120*  --   CREATININE 5.10* 4.20* 5.11*  --   CALCIUM 8.9  --  9.0  --    GFR: Estimated Creatinine Clearance: 17.2 mL/min (A) (by C-G formula based on SCr of 5.11 mg/dL (H)). Recent Labs  Lab 09/02/23 0909 09/02/23 0915 09/02/23 0950 09/02/23 1057  WBC 23.0*  --  23.6*  --   LATICACIDVEN  --  5.3*  --  5.1*    Liver Function Tests: Recent Labs  Lab 09/02/23 0909  AST 17  ALT 42  ALKPHOS 65  BILITOT 2.6*  PROT 7.6  ALBUMIN 3.5   No results for input(s): LIPASE, AMYLASE in the last 168 hours. No results for input(s): AMMONIA in the last 168 hours.  ABG    Component Value Date/Time   HCO3 7.2 (L) 09/02/2023 1006   TCO2 8 (L) 09/02/2023 1006   ACIDBASEDEF 22.0 (H) 09/02/2023 1006   O2SAT 95 09/02/2023 1006     Coagulation Profile: Recent Labs  Lab 09/02/23 0909 09/02/23 1025  INR 1.3* 1.3*    Cardiac Enzymes: Recent Labs  Lab 09/02/23 0950  CKTOTAL 277    HbA1C: No results found for: HGBA1C  CBG: Recent Labs  Lab 09/02/23 0934 09/02/23 1047 09/02/23 1121  GLUCAP >600* >600* >600*    Review of Systems:   Unable due to encephalopathy   Past Medical History:  HTN, HLD, DM, asthma, OSA, ?MI at  32  Surgical History:  unable  Social History:    Married. Wife denies tobacco, ETOH, illicit drug use  Family History:  His family history is not on file.   Allergies Not on File   Home Medications  Prior to Admission medications   Medication Sig Start Date End Date Taking? Authorizing Provider  fenofibrate (TRICOR) 48 MG tablet Take 48 mg by mouth daily. 08/11/23   [provider]  glipiZIDE (GLUCOTROL) 5 MG tablet Take 5 mg by mouth daily. 08/11/23   [provider]  losartan (COZAAR) 50 MG tablet Take 50 mg by mouth daily. 08/02/23   [provider]  meloxicam (MOBIC) 7.5 MG tablet Take 7.5 mg by mouth daily. 08/02/23   [provider]  metFORMIN (GLUCOPHAGE) 500 MG tablet Take 500 mg by mouth 2 (two) times daily. 08/02/23   [provider]  pravastatin (PRAVACHOL) 40 MG tablet Take 40 mg by mouth daily. 08/11/23   [provider]  SYMBICORT 160-4.5 MCG/ACT inhaler Inhale 2 puffs into the lungs. 08/31/23   [provider]  Vitamin D, Ergocalciferol, (DRISDOL) 1.25 MG (50000 UNIT) CAPS capsule Take 50,000 Units by mouth once a week. 08/02/23   [provider]     Critical care time: 50 mins       Lyle Pesa, MSN, AG-ACNP-BC [facility] 09/02/2023, 11:33 AM  See Amion for pager If no response to pager , please call [phone] until 7pm After 7:00 pm call Elink  [phone]

## 2023-09-03 ENCOUNTER — Inpatient Hospital Stay (HOSPITAL_COMMUNITY)

## 2023-09-03 DIAGNOSIS — E870 Hyperosmolality and hypernatremia: Secondary | ICD-10-CM

## 2023-09-03 DIAGNOSIS — A419 Sepsis, unspecified organism: Secondary | ICD-10-CM | POA: Diagnosis not present

## 2023-09-03 DIAGNOSIS — K859 Acute pancreatitis without necrosis or infection, unspecified: Secondary | ICD-10-CM

## 2023-09-03 DIAGNOSIS — E1110 Type 2 diabetes mellitus with ketoacidosis without coma: Secondary | ICD-10-CM | POA: Diagnosis not present

## 2023-09-03 LAB — BASIC METABOLIC PANEL WITH GFR
Anion gap: 14 (ref 5–15)
Anion gap: 14 (ref 5–15)
Anion gap: 15 (ref 5–15)
Anion gap: 13 (ref 5–15)
Anion gap: 14 (ref 5–15)
BUN: 101 mg/dL — ABNORMAL HIGH (ref 8–23)
BUN: 105 mg/dL — ABNORMAL HIGH (ref 8–23)
BUN: 102 mg/dL — ABNORMAL HIGH (ref 8–23)
BUN: 99 mg/dL — ABNORMAL HIGH (ref 8–23)
BUN: 95 mg/dL — ABNORMAL HIGH (ref 8–23)
CO2: 27 mmol/L (ref 22–32)
CO2: 27 mmol/L (ref 22–32)
CO2: 25 mmol/L (ref 22–32)
CO2: 26 mmol/L (ref 22–32)
CO2: 27 mmol/L (ref 22–32)
Calcium: 9.7 mg/dL (ref 8.9–10.3)
Calcium: 9.6 mg/dL (ref 8.9–10.3)
Calcium: 9.1 mg/dL (ref 8.9–10.3)
Calcium: 8.7 mg/dL — ABNORMAL LOW (ref 8.9–10.3)
Calcium: 8.9 mg/dL (ref 8.9–10.3)
Chloride: 121 mmol/L — ABNORMAL HIGH (ref 98–111)
Chloride: 123 mmol/L — ABNORMAL HIGH (ref 98–111)
Chloride: 121 mmol/L — ABNORMAL HIGH (ref 98–111)
Chloride: 116 mmol/L — ABNORMAL HIGH (ref 98–111)
Chloride: 115 mmol/L — ABNORMAL HIGH (ref 98–111)
Creatinine, Ser: 3.73 mg/dL — ABNORMAL HIGH (ref 0.61–1.24)
Creatinine, Ser: 3.92 mg/dL — ABNORMAL HIGH (ref 0.61–1.24)
Creatinine, Ser: 4.45 mg/dL — ABNORMAL HIGH (ref 0.61–1.24)
Creatinine, Ser: 4.46 mg/dL — ABNORMAL HIGH (ref 0.61–1.24)
Creatinine, Ser: 4.51 mg/dL — ABNORMAL HIGH (ref 0.61–1.24)
GFR, Estimated: 18 mL/min — ABNORMAL LOW (ref >60)
GFR, Estimated: 17 mL/min — ABNORMAL LOW (ref >60)
GFR, Estimated: 14 mL/min — ABNORMAL LOW (ref >60)
GFR, Estimated: 14 mL/min — ABNORMAL LOW (ref >60)
GFR, Estimated: 14 mL/min — ABNORMAL LOW (ref >60)
Glucose, Bld: 261 mg/dL — ABNORMAL HIGH (ref 70–99)
Glucose, Bld: 211 mg/dL — ABNORMAL HIGH (ref 70–99)
Glucose, Bld: 214 mg/dL — ABNORMAL HIGH (ref 70–99)
Glucose, Bld: 262 mg/dL — ABNORMAL HIGH (ref 70–99)
Glucose, Bld: 207 mg/dL — ABNORMAL HIGH (ref 70–99)
Potassium: 4 mmol/L (ref 3.5–5.1)
Potassium: 4.4 mmol/L (ref 3.5–5.1)
Potassium: 4 mmol/L (ref 3.5–5.1)
Potassium: 3.9 mmol/L (ref 3.5–5.1)
Potassium: 3.8 mmol/L (ref 3.5–5.1)
Sodium: 162 mmol/L (ref 135–145)
Sodium: 164 mmol/L (ref 135–145)
Sodium: 161 mmol/L (ref 135–145)
Sodium: 155 mmol/L — ABNORMAL HIGH (ref 135–145)
Sodium: 156 mmol/L — ABNORMAL HIGH (ref 135–145)

## 2023-09-03 LAB — DIC (DISSEMINATED INTRAVASCULAR COAGULATION)PANEL
D-Dimer, Quant: 2.37 ug{FEU}/mL — ABNORMAL HIGH (ref 0.00–0.50)
Fibrinogen: 487 mg/dL — ABNORMAL HIGH (ref 210–475)
INR: 1.1 (ref 0.8–1.2)
Platelets: 158 K/uL (ref 150–400)
Prothrombin Time: 14.5 s (ref 11.4–15.2)
Smear Review: NONE SEEN
aPTT: 34 s (ref 24–36)

## 2023-09-03 LAB — GLUCOSE, CAPILLARY
Glucose-Capillary: 139 mg/dL — ABNORMAL HIGH (ref 70–99)
Glucose-Capillary: 166 mg/dL — ABNORMAL HIGH (ref 70–99)
Glucose-Capillary: 179 mg/dL — ABNORMAL HIGH (ref 70–99)
Glucose-Capillary: 180 mg/dL — ABNORMAL HIGH (ref 70–99)
Glucose-Capillary: 186 mg/dL — ABNORMAL HIGH (ref 70–99)
Glucose-Capillary: 188 mg/dL — ABNORMAL HIGH (ref 70–99)
Glucose-Capillary: 193 mg/dL — ABNORMAL HIGH (ref 70–99)
Glucose-Capillary: 201 mg/dL — ABNORMAL HIGH (ref 70–99)
Glucose-Capillary: 202 mg/dL — ABNORMAL HIGH (ref 70–99)
Glucose-Capillary: 208 mg/dL — ABNORMAL HIGH (ref 70–99)
Glucose-Capillary: 210 mg/dL — ABNORMAL HIGH (ref 70–99)
Glucose-Capillary: 213 mg/dL — ABNORMAL HIGH (ref 70–99)
Glucose-Capillary: 223 mg/dL — ABNORMAL HIGH (ref 70–99)
Glucose-Capillary: 225 mg/dL — ABNORMAL HIGH (ref 70–99)
Glucose-Capillary: 226 mg/dL — ABNORMAL HIGH (ref 70–99)
Glucose-Capillary: 229 mg/dL — ABNORMAL HIGH (ref 70–99)
Glucose-Capillary: 241 mg/dL — ABNORMAL HIGH (ref 70–99)
Glucose-Capillary: 241 mg/dL — ABNORMAL HIGH (ref 70–99)
Glucose-Capillary: 253 mg/dL — ABNORMAL HIGH (ref 70–99)
Glucose-Capillary: 265 mg/dL — ABNORMAL HIGH (ref 70–99)
Glucose-Capillary: 304 mg/dL — ABNORMAL HIGH (ref 70–99)
Glucose-Capillary: 341 mg/dL — ABNORMAL HIGH (ref 70–99)
Glucose-Capillary: 361 mg/dL — ABNORMAL HIGH (ref 70–99)

## 2023-09-03 LAB — CBC
HCT: 48.8 % (ref 39.0–52.0)
Hemoglobin: 16.2 g/dL (ref 13.0–17.0)
MCH: 29 pg (ref 26.0–34.0)
MCHC: 33.2 g/dL (ref 30.0–36.0)
MCV: 87.3 fL (ref 80.0–100.0)
Platelets: 175 K/uL (ref 150–400)
RBC: 5.59 MIL/uL (ref 4.22–5.81)
RDW: 12.1 % (ref 11.5–15.5)
WBC: 14.5 K/uL — ABNORMAL HIGH (ref 4.0–10.5)
nRBC: 0 % (ref 0.0–0.2)

## 2023-09-03 LAB — MAGNESIUM: Magnesium: 4 mg/dL — ABNORMAL HIGH (ref 1.7–2.4)

## 2023-09-03 LAB — HEPATIC FUNCTION PANEL
ALT: 38 U/L (ref 0–44)
AST: 45 U/L — ABNORMAL HIGH (ref 15–41)
Albumin: 3.3 g/dL — ABNORMAL LOW (ref 3.5–5.0)
Alkaline Phosphatase: 52 U/L (ref 38–126)
Bilirubin, Direct: 0.1 mg/dL (ref 0.0–0.2)
Indirect Bilirubin: 1 mg/dL — ABNORMAL HIGH (ref 0.3–0.9)
Total Bilirubin: 1.1 mg/dL (ref 0.0–1.2)
Total Protein: 7.4 g/dL (ref 6.5–8.1)

## 2023-09-03 LAB — HEMOGLOBIN AND HEMATOCRIT, BLOOD
HCT: 50.6 % (ref 39.0–52.0)
HCT: 50.2 % (ref 39.0–52.0)
HCT: 49 % (ref 39.0–52.0)
HCT: 50.4 % (ref 39.0–52.0)
Hemoglobin: 16.4 g/dL (ref 13.0–17.0)
Hemoglobin: 16.3 g/dL (ref 13.0–17.0)
Hemoglobin: 15.6 g/dL (ref 13.0–17.0)
Hemoglobin: 16 g/dL (ref 13.0–17.0)

## 2023-09-03 LAB — AMMONIA: Ammonia: 61 umol/L — ABNORMAL HIGH (ref 9–35)

## 2023-09-03 LAB — LIPASE, BLOOD: Lipase: 582 U/L — ABNORMAL HIGH (ref 11–51)

## 2023-09-03 LAB — SODIUM: Sodium: 165 mmol/L (ref 135–145)

## 2023-09-03 LAB — BETA-HYDROXYBUTYRIC ACID: Beta-Hydroxybutyric Acid: 0.32 mmol/L — ABNORMAL HIGH (ref 0.05–0.27)

## 2023-09-03 LAB — TRIGLYCERIDES: Triglycerides: 146 mg/dL (ref <150)

## 2023-09-03 MED ORDER — HYDRALAZINE HCL 20 MG/ML IJ SOLN: 10.0000 mg | INTRAMUSCULAR | Status: DC | PRN

## 2023-09-03 MED ORDER — DEXTROSE 5 % IV BOLUS
1000.0000 mL | Freq: Once | INTRAVENOUS | Status: AC
  Administered 2023-09-03: 1000 mL via INTRAVENOUS

## 2023-09-03 MED ORDER — DEXTROSE 5 % IV SOLN: INTRAVENOUS | Status: AC

## 2023-09-03 MED ORDER — INSULIN REGULAR(HUMAN) IN NACL 100-0.9 UT/100ML-% IV SOLN
INTRAVENOUS | Status: AC
  Administered 2023-09-03: 6 [IU]/h via INTRAVENOUS
  Administered 2023-09-03: 15 [IU]/h via INTRAVENOUS
  Administered 2023-09-04: 6.5 [IU]/h via INTRAVENOUS
  Administered 2023-09-05: 8 [IU]/h via INTRAVENOUS
  Administered 2023-09-05: 6.5 [IU]/h via INTRAVENOUS
  Administered 2023-09-05: 10.5 [IU]/h via INTRAVENOUS
  Filled 2023-09-03 (×6): qty 100

## 2023-09-03 MED ORDER — PIPERACILLIN-TAZOBACTAM 3.375 G IVPB
3.3750 g | Freq: Two times a day (BID) | INTRAVENOUS | Status: DC
  Administered 2023-09-04: 3.375 g via INTRAVENOUS
  Filled 2023-09-03: qty 50

## 2023-09-03 NOTE — Progress Notes (Signed)
 PHARMACY NOTE:  ANTIMICROBIAL RENAL DOSAGE ADJUSTMENT  Current antimicrobial regimen includes a mismatch between antimicrobial dosage and estimated renal function.  As per policy approved by the Pharmacy & Therapeutics and Medical Executive Committees, the antimicrobial dosage will be adjusted accordingly.  Current antimicrobial dosage:  Zosyn  3.375 g IV q8h - 4hr infusion  Indication: Sepsis - intra-abdominal source  Renal Function:  Estimated Creatinine Clearance: 19.7 mL/min (A) (by C-G formula based on SCr of 4.45 mg/dL (H)). []      On intermittent HD, scheduled: []      On CRRT    Antimicrobial dosage has been changed to:  Zosyn  3.375g IV q12h - 4hr infusion  Additional comments:   Thank you for allowing pharmacy to be a part of this patient's care.  Harlene Boga, PharmD, BCPS, BCCCP Please refer to [facility] for [facility] Pharmacy numbers 09/03/2023 4:31 PM

## 2023-09-03 NOTE — Progress Notes (Signed)
 CCM called due to pt throwing up coffee grounds and bright red blood in urine

## 2023-09-03 NOTE — Progress Notes (Signed)
 NAME:  Jack Cunningham, MRN:  [id number], DOB:  April 19, 1960, LOS: 1 ADMISSION DATE:  09/02/2023, CONSULTATION DATE:  09/02/23 REFERRING MD:  Flint POUR, PA, CHIEF COMPLAINT:  AMS   History of Present Illness:  HPI obtained from wife at bedside as pt remains encephalopathic.   26 yoM with PMH of recent dx of DM, HTN, asthma, OSA who presented by EMS from home after wife heard a thud, found on the floor asking for help up, progressively became unresponsive, GCS 3-4 with EMS.  Not on blood thinners. Given GCS and fall, was leveled as a trauma.  Wife reports he has not felt well for several weeks.  Recently diagnosed with DM ~ 1 month ago but has been unable to check CBGs at home due to meter error.  Wife reports polydipsia but otherwise poor PO intake, polyuria but over several days unable to void due to burning, some blurred vision, no BM in 2 weeks, and recent unintentional 28lb weight loss.  Episode of vomiting last evening.  EMS CBG 300s.  Wife denies any tobacco, ETOH, or illicit drug use.  In ER pt was more responsive, oriented to self, following simple commands, hemodynamically stable and normoxia otherwise.  Temp 90 currently on bairhugger.  Trauma scan negative for acute injuries- CTH/ cervical, XR pelvis, and CXR neg.  Labs noted for glucose > 1200, bicarb 8, BUN/ sCr 120/5.1, CK 277, lactic 5.3> 5.1, WBC 23.6, INR 1.3, iCa 1.06, ETOH neg. EKG NSR w/o acute ST changes.  ABG 7.062/ 25/ 108/ 7.2.  Treated with insulin  gtt, getting 2L LR, bicarb, cultures sent and started on empiric ceftriaxone  and flagyl .  UA pending.  Pt remains lethargic but oriented to self, following simple commands and denies pain.  PCCM called for admit.   Of note, additional chart not merged thus far.  MRN [id number]  Pertinent  Medical History  HTN, HLD, DM, asthma, OSA, ?MI at 44- wife unclear   Significant Hospital Events: Including procedures, antibiotic start and stop dates in addition to other pertinent events   7/3  Admit DKA  Interim History / Subjective:   No acute events overnight Remains on insulin  drip Lipase is elevated, concern for abdominal pain over the past week per patient and family Wife and daughter updated at bedside  Objective    Blood pressure (!) 144/83, pulse 84, temperature 98.1 F (36.7 C), temperature source Axillary, resp. rate (!) 21, height 5' 10 (1.778 m), weight 93 kg, SpO2 100%.    FiO2 (%):  [40 %] 40 %   Intake/Output Summary (Last 24 hours) at 09/03/2023 1041 Last data filed at 09/03/2023 0700 Gross per 24 hour  Intake 5580.46 ml  Output 3225 ml  Net 2355.46 ml   Filed Weights   09/02/23 0940  Weight: 93 kg    Examination: General:  ill appearing older male lying in bed in NAD HEENT: MM pink/dry, pupils 3/r Neuro: lethargic, opens eyes to verbal, follows simple commands, moving all extremities  CV: rr, NSR, no murmur PULM:  non labored/ deep, clear, diminished in bases GI: soft, bs+, NT Extremities: warm/dry, no LE edema  Skin: no rashes   Resolved problem list  Hyperkalemia without EKG changes 2/2 DKA Hypocalcemia   Assessment and Plan   Sepsis due to pancreatitis - POA - continue aggressive fluid resuscitaiton - D5W at 218mL/hr - LFTs not elevated, low concern for obstructive stone. Triglycerides not elevated. - follow cultures, on empiric zosyn  - will consider CT abdomen if not  improving  Recently dx DM now with DKA Severe AGMA/ lactic acidosis  AKI 2/2 to severe dehydration +/- ARB Hypernatremia - NPO, serial exams - goal MAP > 65, remains normotensive - warming measures prn  - insulin  gtt per endotool  - check A1c/ consult to DM coordinator  - q4hr BMET/ BHA - Renal US  unremarkable - hold pta metformin/ glipizide  - Continue D5W fluids for free water deficit  Acute Metabolic Encephalopathy - due to DKA and sepsis - treatment as above  Unwitnessed fall - trauma scans neg, cleared  HTN HLD - hold pta losartan and  statin  OSA - wife unsure of hx, does not use CPAP  Asthma - no wheezing, normoxia - symbicort   Best Practice (right click and Reselect all SmartList Selections daily)   Diet/type: NPO DVT prophylaxis prophylactic heparin   Pressure ulcer(s): N/A GI prophylaxis: N/A Lines: N/A Foley:  N/A Code Status:  full code Last date of multidisciplinary goals of care discussion [7/3]  Wife and daughter updated at bedside.   Labs   CBC: Recent Labs  Lab 09/02/23 0909 09/02/23 0914 09/02/23 0950 09/02/23 1006 09/02/23 2013 09/03/23 0140 09/03/23 0704 09/03/23 0929  WBC 23.0*  --  23.6*  --  17.6* 14.5*  --   --   NEUTROABS  --   --  20.2*  --   --   --   --   --   HGB 15.9   < > 15.3 16.7 16.4 16.2 16.4 16.3  HCT 57.3*   < > 54.8* 49.0 49.6 48.8 50.6 50.2  MCV 103.1*  --  102.6*  --  87.8 87.3  --   --   PLT 267  --  256  --  209 175 158  --    < > = values in this interval not displayed.    Basic Metabolic Panel: Recent Labs  Lab 09/02/23 1303 09/02/23 1649 09/02/23 2013 09/03/23 0140 09/03/23 0704  NA 141 149* 157* 162* 164*  K 5.0 4.4 4.4 4.0 4.4  CL 100 110 116* 121* 123*  CO2 10* 19* 24 27 27   GLUCOSE >1,200* 776* 535* 261* 211*  BUN 118* 110* 100* 101* 105*  CREATININE 4.83* 3.95* 3.39* 3.73* 3.92*  CALCIUM  9.1 9.0 9.7 9.7 9.6  MG  --   --   --  4.0*  --    GFR: Estimated Creatinine Clearance: 22.4 mL/min (A) (by C-G formula based on SCr of 3.92 mg/dL (H)). Recent Labs  Lab 09/02/23 0909 09/02/23 0915 09/02/23 0950 09/02/23 1057 09/02/23 1303 09/02/23 2013 09/03/23 0140  WBC 23.0*  --  23.6*  --   --  17.6* 14.5*  LATICACIDVEN  --  5.3*  --  5.1* 2.7*  --   --     Liver Function Tests: Recent Labs  Lab 09/02/23 0909 09/02/23 1303 09/03/23 0704  AST 17 18 45*  ALT 42 39 38  ALKPHOS 65 57 52  BILITOT 2.6* 1.7* 1.1  PROT 7.6 6.8 7.4  ALBUMIN 3.5 3.3* 3.3*   Recent Labs  Lab 09/02/23 2013  LIPASE 2,665*   Recent Labs  Lab  09/03/23 0704  AMMONIA 61*    ABG    Component Value Date/Time   HCO3 7.2 (L) 09/02/2023 1006   TCO2 8 (L) 09/02/2023 1006   ACIDBASEDEF 22.0 (H) 09/02/2023 1006   O2SAT 95 09/02/2023 1006     Coagulation Profile: Recent Labs  Lab 09/02/23 0909 09/02/23 1025 09/03/23 0704  INR  1.3* 1.3* 1.1    Cardiac Enzymes: Recent Labs  Lab 09/02/23 0950  CKTOTAL 277    HbA1C: No results found for: HGBA1C  CBG: Recent Labs  Lab 09/03/23 0631 09/03/23 0729 09/03/23 0826 09/03/23 0928 09/03/23 1030  GLUCAP 193* 180* 179* 166* 229*       Critical care time: 45 mins       Dorn Chill, MD [facility] Office: [phone]   See Amion for personal pager PCCM on call pager [phone] until 7pm. Please call Elink 7p-7a. [phone]

## 2023-09-03 NOTE — Plan of Care (Signed)
   Problem: Clinical Measurements: Goal: Ability to maintain clinical measurements within normal limits will improve Outcome: Progressing   Problem: Clinical Measurements: Goal: Will remain free from infection Outcome: Progressing   Problem: Clinical Measurements: Goal: Diagnostic test results will improve Outcome: Progressing

## 2023-09-03 NOTE — Progress Notes (Signed)
 eLink Physician-Brief Progress Note Patient Name: Jack Cunningham DOB: [birth year] MRN: [id number]   Date of Service  09/03/2023  HPI/Events of Note  BP creeping up, 150/65 map 88  eICU Interventions  Hydralazine  PRN   0335 - Add tylenol  for fevers or headaches, Kcl  Intervention Category Minor Interventions: Routine modifications to care plan (e.g. PRN medications for pain, fever)  Ki Luckman 09/03/2023, 9:43 PM

## 2023-09-04 ENCOUNTER — Inpatient Hospital Stay (HOSPITAL_COMMUNITY)

## 2023-09-04 DIAGNOSIS — K859 Acute pancreatitis without necrosis or infection, unspecified: Secondary | ICD-10-CM | POA: Diagnosis not present

## 2023-09-04 DIAGNOSIS — A419 Sepsis, unspecified organism: Secondary | ICD-10-CM | POA: Diagnosis not present

## 2023-09-04 DIAGNOSIS — E870 Hyperosmolality and hypernatremia: Secondary | ICD-10-CM | POA: Diagnosis not present

## 2023-09-04 DIAGNOSIS — E1110 Type 2 diabetes mellitus with ketoacidosis without coma: Secondary | ICD-10-CM | POA: Diagnosis not present

## 2023-09-04 LAB — BASIC METABOLIC PANEL WITH GFR
Anion gap: 16 — ABNORMAL HIGH (ref 5–15)
Anion gap: 13 (ref 5–15)
Anion gap: 15 (ref 5–15)
Anion gap: 12 (ref 5–15)
Anion gap: 12 (ref 5–15)
BUN: 93 mg/dL — ABNORMAL HIGH (ref 8–23)
BUN: 91 mg/dL — ABNORMAL HIGH (ref 8–23)
BUN: 87 mg/dL — ABNORMAL HIGH (ref 8–23)
BUN: 86 mg/dL — ABNORMAL HIGH (ref 8–23)
BUN: 84 mg/dL — ABNORMAL HIGH (ref 8–23)
CO2: 24 mmol/L (ref 22–32)
CO2: 23 mmol/L (ref 22–32)
CO2: 26 mmol/L (ref 22–32)
CO2: 24 mmol/L (ref 22–32)
CO2: 27 mmol/L (ref 22–32)
Calcium: 8.7 mg/dL — ABNORMAL LOW (ref 8.9–10.3)
Calcium: 8.5 mg/dL — ABNORMAL LOW (ref 8.9–10.3)
Calcium: 8.5 mg/dL — ABNORMAL LOW (ref 8.9–10.3)
Calcium: 8.4 mg/dL — ABNORMAL LOW (ref 8.9–10.3)
Calcium: 8.7 mg/dL — ABNORMAL LOW (ref 8.9–10.3)
Chloride: 118 mmol/L — ABNORMAL HIGH (ref 98–111)
Chloride: 118 mmol/L — ABNORMAL HIGH (ref 98–111)
Chloride: 112 mmol/L — ABNORMAL HIGH (ref 98–111)
Chloride: 116 mmol/L — ABNORMAL HIGH (ref 98–111)
Chloride: 115 mmol/L — ABNORMAL HIGH (ref 98–111)
Creatinine, Ser: 4.42 mg/dL — ABNORMAL HIGH (ref 0.61–1.24)
Creatinine, Ser: 4.57 mg/dL — ABNORMAL HIGH (ref 0.61–1.24)
Creatinine, Ser: 4.18 mg/dL — ABNORMAL HIGH (ref 0.61–1.24)
Creatinine, Ser: 4.06 mg/dL — ABNORMAL HIGH (ref 0.61–1.24)
Creatinine, Ser: 3.89 mg/dL — ABNORMAL HIGH (ref 0.61–1.24)
GFR, Estimated: 14 mL/min — ABNORMAL LOW (ref >60)
GFR, Estimated: 14 mL/min — ABNORMAL LOW (ref >60)
GFR, Estimated: 15 mL/min — ABNORMAL LOW (ref >60)
GFR, Estimated: 16 mL/min — ABNORMAL LOW (ref >60)
GFR, Estimated: 17 mL/min — ABNORMAL LOW (ref >60)
Glucose, Bld: 169 mg/dL — ABNORMAL HIGH (ref 70–99)
Glucose, Bld: 176 mg/dL — ABNORMAL HIGH (ref 70–99)
Glucose, Bld: 163 mg/dL — ABNORMAL HIGH (ref 70–99)
Glucose, Bld: 200 mg/dL — ABNORMAL HIGH (ref 70–99)
Glucose, Bld: 189 mg/dL — ABNORMAL HIGH (ref 70–99)
Potassium: 3.5 mmol/L (ref 3.5–5.1)
Potassium: 3.4 mmol/L — ABNORMAL LOW (ref 3.5–5.1)
Potassium: 3.6 mmol/L (ref 3.5–5.1)
Potassium: 3.9 mmol/L (ref 3.5–5.1)
Potassium: 4 mmol/L (ref 3.5–5.1)
Sodium: 158 mmol/L — ABNORMAL HIGH (ref 135–145)
Sodium: 154 mmol/L — ABNORMAL HIGH (ref 135–145)
Sodium: 153 mmol/L — ABNORMAL HIGH (ref 135–145)
Sodium: 152 mmol/L — ABNORMAL HIGH (ref 135–145)
Sodium: 154 mmol/L — ABNORMAL HIGH (ref 135–145)

## 2023-09-04 LAB — GLUCOSE, CAPILLARY
Glucose-Capillary: 122 mg/dL — ABNORMAL HIGH (ref 70–99)
Glucose-Capillary: 127 mg/dL — ABNORMAL HIGH (ref 70–99)
Glucose-Capillary: 133 mg/dL — ABNORMAL HIGH (ref 70–99)
Glucose-Capillary: 133 mg/dL — ABNORMAL HIGH (ref 70–99)
Glucose-Capillary: 153 mg/dL — ABNORMAL HIGH (ref 70–99)
Glucose-Capillary: 157 mg/dL — ABNORMAL HIGH (ref 70–99)
Glucose-Capillary: 158 mg/dL — ABNORMAL HIGH (ref 70–99)
Glucose-Capillary: 163 mg/dL — ABNORMAL HIGH (ref 70–99)
Glucose-Capillary: 170 mg/dL — ABNORMAL HIGH (ref 70–99)
Glucose-Capillary: 174 mg/dL — ABNORMAL HIGH (ref 70–99)
Glucose-Capillary: 177 mg/dL — ABNORMAL HIGH (ref 70–99)
Glucose-Capillary: 180 mg/dL — ABNORMAL HIGH (ref 70–99)
Glucose-Capillary: 186 mg/dL — ABNORMAL HIGH (ref 70–99)
Glucose-Capillary: 193 mg/dL — ABNORMAL HIGH (ref 70–99)
Glucose-Capillary: 194 mg/dL — ABNORMAL HIGH (ref 70–99)
Glucose-Capillary: 196 mg/dL — ABNORMAL HIGH (ref 70–99)
Glucose-Capillary: 207 mg/dL — ABNORMAL HIGH (ref 70–99)
Glucose-Capillary: 218 mg/dL — ABNORMAL HIGH (ref 70–99)
Glucose-Capillary: 219 mg/dL — ABNORMAL HIGH (ref 70–99)
Glucose-Capillary: 235 mg/dL — ABNORMAL HIGH (ref 70–99)
Glucose-Capillary: 240 mg/dL — ABNORMAL HIGH (ref 70–99)
Glucose-Capillary: 246 mg/dL — ABNORMAL HIGH (ref 70–99)
Glucose-Capillary: 258 mg/dL — ABNORMAL HIGH (ref 70–99)

## 2023-09-04 LAB — SODIUM, URINE, RANDOM: Sodium, Ur: 24 mmol/L

## 2023-09-04 LAB — HEMOGLOBIN A1C
Hgb A1c MFr Bld: 13.5 % — ABNORMAL HIGH (ref 4.8–5.6)
Mean Plasma Glucose: 341 mg/dL

## 2023-09-04 MED ORDER — ACETAMINOPHEN 325 MG PO TABS
650.0000 mg | ORAL_TABLET | Freq: Four times a day (QID) | ORAL | Status: DC | PRN
  Administered 2023-09-04: 650 mg via ORAL
  Filled 2023-09-04: qty 2

## 2023-09-04 MED ORDER — DEXTROSE 5 % IV SOLN: INTRAVENOUS | Status: AC

## 2023-09-04 MED ORDER — LINEZOLID 600 MG/300ML IV SOLN
600.0000 mg | Freq: Two times a day (BID) | INTRAVENOUS | Status: DC
  Administered 2023-09-04 – 2023-09-06 (×5): 600 mg via INTRAVENOUS
  Filled 2023-09-04 (×7): qty 300

## 2023-09-04 MED ORDER — POTASSIUM CHLORIDE 10 MEQ/100ML IV SOLN
10.0000 meq | INTRAVENOUS | Status: AC
  Administered 2023-09-04 (×4): 10 meq via INTRAVENOUS
  Filled 2023-09-04 (×4): qty 100

## 2023-09-04 MED ORDER — POTASSIUM CHLORIDE 20 MEQ PO PACK
40.0000 meq | PACK | Freq: Once | ORAL | Status: AC
  Administered 2023-09-04: 40 meq via ORAL

## 2023-09-04 MED ORDER — CARMEX CLASSIC LIP BALM EX OINT
TOPICAL_OINTMENT | CUTANEOUS | Status: DC | PRN
  Filled 2023-09-04: qty 10

## 2023-09-04 MED ORDER — ACETAMINOPHEN 650 MG RE SUPP
650.0000 mg | Freq: Four times a day (QID) | RECTAL | Status: DC | PRN
  Administered 2023-09-05: 650 mg via RECTAL
  Filled 2023-09-04: qty 1

## 2023-09-04 MED ORDER — CHLORHEXIDINE GLUCONATE CLOTH 2 % EX PADS
6.0000 | MEDICATED_PAD | CUTANEOUS | Status: DC
  Administered 2023-09-04 – 2023-09-15 (×7): 6 via TOPICAL

## 2023-09-04 MED ORDER — PIPERACILLIN-TAZOBACTAM 3.375 G IVPB
3.3750 g | Freq: Three times a day (TID) | INTRAVENOUS | Status: AC
  Administered 2023-09-04 – 2023-09-08 (×13): 3.375 g via INTRAVENOUS
  Filled 2023-09-04 (×14): qty 50

## 2023-09-04 NOTE — Plan of Care (Signed)

## 2023-09-04 NOTE — Progress Notes (Signed)
 Patient transferred to CT and back to room 86m10 with transport and primary RN. Patient tolerated trip well.

## 2023-09-04 NOTE — Progress Notes (Signed)
 eLink Physician-Brief Progress Note Patient Name: Jack Cunningham DOB: [birth year] MRN: [id number]   Date of Service  09/04/2023  HPI/Events of Note  63 year old male admitted for DKA and pancreatitis.  Was on Zosyn .  Discontinued today.  Patient now spiking fevers with oral temperature of 102 F.  He is also tachycardic in the 100's.  eICU Interventions  Patient was reviewed.  Pertinent labs and imaging studies reviewed. Will order new blood cultures and a CT chest abdomen pelvis to try to identify the cause of his fever.  Will also place him on broad-spectrum antibiotics pending culture data finalization. Discussed with RN.     Intervention Category Intermediate Interventions: Other: (Fever)  Jerilynn Berg 09/04/2023, 9:09 PM

## 2023-09-04 NOTE — Progress Notes (Signed)
 NAME:  Jack Cunningham, MRN:  [id number], DOB:  [birth year], LOS: 2 ADMISSION DATE:  09/02/2023, CONSULTATION DATE:  09/02/23 REFERRING MD:  Flint POUR, PA, CHIEF COMPLAINT:  AMS   History of Present Illness:  HPI obtained from wife at bedside as pt remains encephalopathic.   72 yoM with PMH of recent dx of DM, HTN, asthma, OSA who presented by EMS from home after wife heard a thud, found on the floor asking for help up, progressively became unresponsive, GCS 3-4 with EMS.  Not on blood thinners. Given GCS and fall, was leveled as a trauma.  Wife reports he has not felt well for several weeks.  Recently diagnosed with DM ~ 1 month ago but has been unable to check CBGs at home due to meter error.  Wife reports polydipsia but otherwise poor PO intake, polyuria but over several days unable to void due to burning, some blurred vision, no BM in 2 weeks, and recent unintentional 28lb weight loss.  Episode of vomiting last evening.  EMS CBG 300s.  Wife denies any tobacco, ETOH, or illicit drug use.  In ER pt was more responsive, oriented to self, following simple commands, hemodynamically stable and normoxia otherwise.  Temp 90 currently on bairhugger.  Trauma scan negative for acute injuries- CTH/ cervical, XR pelvis, and CXR neg.  Labs noted for glucose > 1200, bicarb 8, BUN/ sCr 120/5.1, CK 277, lactic 5.3> 5.1, WBC 23.6, INR 1.3, iCa 1.06, ETOH neg. EKG NSR w/o acute ST changes.  ABG 7.062/ 25/ 108/ 7.2.  Treated with insulin  gtt, getting 2L LR, bicarb, cultures sent and started on empiric ceftriaxone  and flagyl .  UA pending.  Pt remains lethargic but oriented to self, following simple commands and denies pain.  PCCM called for admit.   Of note, additional chart not merged thus far.  MRN [id number]  Pertinent  Medical History  HTN, HLD, DM, asthma, OSA, ?MI at 67- wife unclear   Significant Hospital Events: Including procedures, antibiotic start and stop dates in addition to other pertinent events   7/3  Admit DKA  Interim History / Subjective:   No acute events overnight Remains on insulin  drip No abdominal pain, more alert today  Objective    Blood pressure (!) 142/70, pulse 99, temperature 99.3 F (37.4 C), temperature source Oral, resp. rate (!) 31, height 5' 10 (1.778 m), weight 89.6 kg, SpO2 99%.        Intake/Output Summary (Last 24 hours) at 09/04/2023 0955 Last data filed at 09/04/2023 0900 Gross per 24 hour  Intake 7217.69 ml  Output 2750 ml  Net 4467.69 ml   Filed Weights   09/02/23 0940 09/04/23 0321  Weight: 93 kg 89.6 kg    Examination: General:  ill appearing older male lying in bed in NAD HEENT: MM pink/dry, pupils 3/r Neuro: lethargic, but able to converse, following commands CV: rr, NSR, no murmur PULM:  clear to auscultation, no wheezing GI: soft, bs+, NT GU: foley in place, small blood clots in urine Extremities: warm/dry, no LE edema  Skin: no rashes   Resolved problem list  Hyperkalemia without EKG changes 2/2 DKA Hypocalcemia  Severe AGMA/ lactic acidosis   Assessment and Plan   Sepsis due to pancreatitis - POA - continue aggressive fluid resuscitaiton - D5W at 148mL/hr - LFTs not elevated, low concern for obstructive stone. Triglycerides not elevated. - follow cultures, on empiric zosyn  - will consider CT abdomen if not improving  Recently dx DM now with DKA AKI  2/2 to severe dehydration +/- ARB Hypernatremia Hypokalemia - insulin  gtt per endotool  - Hgb A1c 13.5 on admission  - q4hr BMET/ BHA - Renal US  unremarkable - hold pta metformin/ glipizide  - Continue D5W fluids for free water deficit - replete electrolytes  Acute Metabolic Encephalopathy - due to DKA and sepsis - treatment as above - improving  Hematuria - concern for foley trauma - may need urology follow up  Unwitnessed fall - trauma scans neg, cleared  HTN HLD - hold pta losartan and statin  OSA - wife unsure of hx, does not use CPAP  Asthma - no  wheezing, normoxia - symbicort   Best Practice (right click and Reselect all SmartList Selections daily)   Diet/type: clear liquids DVT prophylaxis prophylactic heparin   Pressure ulcer(s): N/A GI prophylaxis: N/A Lines: N/A Foley:  Yes, and it is still needed Code Status:  full code Last date of multidisciplinary goals of care discussion [updated wife and patient at bedside today]  Labs   CBC: Recent Labs  Lab 09/02/23 0909 09/02/23 0914 09/02/23 0950 09/02/23 1006 09/02/23 2013 09/03/23 0140 09/03/23 0704 09/03/23 0929 09/03/23 1458 09/03/23 2224  WBC 23.0*  --  23.6*  --  17.6* 14.5*  --   --   --   --   NEUTROABS  --   --  20.2*  --   --   --   --   --   --   --   HGB 15.9   < > 15.3   < > 16.4 16.2 16.4 16.3 15.6 16.0  HCT 57.3*   < > 54.8*   < > 49.6 48.8 50.6 50.2 49.0 50.4  MCV 103.1*  --  102.6*  --  87.8 87.3  --   --   --   --   PLT 267  --  256  --  209 175 158  --   --   --    < > = values in this interval not displayed.    Basic Metabolic Panel: Recent Labs  Lab 09/03/23 0140 09/03/23 0704 09/03/23 1458 09/03/23 1811 09/03/23 2224 09/04/23 0220 09/04/23 0720  NA 162*   < > 161* 155* 156* 158* 154*  K 4.0   < > 4.0 3.9 3.8 3.5 3.4*  CL 121*   < > 121* 116* 115* 118* 118*  CO2 27   < > 25 26 27 24 23   GLUCOSE 261*   < > 214* 262* 207* 169* 176*  BUN 101*   < > 102* 99* 95* 93* 91*  CREATININE 3.73*   < > 4.45* 4.46* 4.51* 4.42* 4.57*  CALCIUM  9.7   < > 9.1 8.7* 8.9 8.7* 8.5*  MG 4.0*  --   --   --   --   --   --    < > = values in this interval not displayed.   GFR: Estimated Creatinine Clearance: 18.9 mL/min (A) (by C-G formula based on SCr of 4.57 mg/dL (H)). Recent Labs  Lab 09/02/23 0909 09/02/23 0915 09/02/23 0950 09/02/23 1057 09/02/23 1303 09/02/23 2013 09/03/23 0140  WBC 23.0*  --  23.6*  --   --  17.6* 14.5*  LATICACIDVEN  --  5.3*  --  5.1* 2.7*  --   --     Liver Function Tests: Recent Labs  Lab 09/02/23 0909  09/02/23 1303 09/03/23 0704  AST 17 18 45*  ALT 42 39 38  ALKPHOS 65 57 52  BILITOT 2.6* 1.7* 1.1  PROT 7.6 6.8 7.4  ALBUMIN 3.5 3.3* 3.3*   Recent Labs  Lab 09/02/23 2013 09/03/23 1811  LIPASE 2,665* 582*   Recent Labs  Lab 09/03/23 0704  AMMONIA 61*    ABG    Component Value Date/Time   HCO3 7.2 (L) 09/02/2023 1006   TCO2 8 (L) 09/02/2023 1006   ACIDBASEDEF 22.0 (H) 09/02/2023 1006   O2SAT 95 09/02/2023 1006     Coagulation Profile: Recent Labs  Lab 09/02/23 0909 09/02/23 1025 09/03/23 0704  INR 1.3* 1.3* 1.1    Cardiac Enzymes: Recent Labs  Lab 09/02/23 0950  CKTOTAL 277    HbA1C: Hgb A1c MFr Bld  Date/Time Value Ref Range Status  09/02/2023 01:03 PM 13.5 (H) 4.8 - 5.6 % Final    Comment:    (NOTE)         Prediabetes: 5.7 - 6.4         Diabetes: >6.4         Glycemic control for adults with diabetes: <7.0     CBG: Recent Labs  Lab 09/04/23 0525 09/04/23 0627 09/04/23 0726 09/04/23 0826 09/04/23 0925  GLUCAP 240* 218* 177* 170* 133*       Critical care time: 35 mins       Dorn Chill, MD [facility] Office: [phone]   See Amion for personal pager PCCM on call pager [phone] until 7pm. Please call Elink 7p-7a. [phone]

## 2023-09-05 DIAGNOSIS — J189 Pneumonia, unspecified organism: Secondary | ICD-10-CM

## 2023-09-05 DIAGNOSIS — K859 Acute pancreatitis without necrosis or infection, unspecified: Secondary | ICD-10-CM | POA: Diagnosis not present

## 2023-09-05 DIAGNOSIS — J9601 Acute respiratory failure with hypoxia: Secondary | ICD-10-CM

## 2023-09-05 DIAGNOSIS — A419 Sepsis, unspecified organism: Secondary | ICD-10-CM | POA: Diagnosis not present

## 2023-09-05 LAB — BASIC METABOLIC PANEL WITH GFR
Anion gap: 10 (ref 5–15)
Anion gap: 12 (ref 5–15)
Anion gap: 12 (ref 5–15)
Anion gap: 9 (ref 5–15)
Anion gap: 9 (ref 5–15)
BUN: 68 mg/dL — ABNORMAL HIGH (ref 8–23)
BUN: 72 mg/dL — ABNORMAL HIGH (ref 8–23)
BUN: 76 mg/dL — ABNORMAL HIGH (ref 8–23)
BUN: 80 mg/dL — ABNORMAL HIGH (ref 8–23)
BUN: 81 mg/dL — ABNORMAL HIGH (ref 8–23)
CO2: 24 mmol/L (ref 22–32)
CO2: 25 mmol/L (ref 22–32)
CO2: 25 mmol/L (ref 22–32)
CO2: 26 mmol/L (ref 22–32)
CO2: 26 mmol/L (ref 22–32)
Calcium: 7.8 mg/dL — ABNORMAL LOW (ref 8.9–10.3)
Calcium: 7.9 mg/dL — ABNORMAL LOW (ref 8.9–10.3)
Calcium: 7.9 mg/dL — ABNORMAL LOW (ref 8.9–10.3)
Calcium: 8 mg/dL — ABNORMAL LOW (ref 8.9–10.3)
Calcium: 8.2 mg/dL — ABNORMAL LOW (ref 8.9–10.3)
Chloride: 110 mmol/L (ref 98–111)
Chloride: 111 mmol/L (ref 98–111)
Chloride: 113 mmol/L — ABNORMAL HIGH (ref 98–111)
Chloride: 114 mmol/L — ABNORMAL HIGH (ref 98–111)
Chloride: 117 mmol/L — ABNORMAL HIGH (ref 98–111)
Creatinine, Ser: 3.17 mg/dL — ABNORMAL HIGH (ref 0.61–1.24)
Creatinine, Ser: 3.22 mg/dL — ABNORMAL HIGH (ref 0.61–1.24)
Creatinine, Ser: 3.25 mg/dL — ABNORMAL HIGH (ref 0.61–1.24)
Creatinine, Ser: 3.34 mg/dL — ABNORMAL HIGH (ref 0.61–1.24)
Creatinine, Ser: 3.35 mg/dL — ABNORMAL HIGH (ref 0.61–1.24)
GFR, Estimated: 20 mL/min — ABNORMAL LOW (ref >60)
GFR, Estimated: 20 mL/min — ABNORMAL LOW (ref >60)
GFR, Estimated: 21 mL/min — ABNORMAL LOW (ref >60)
GFR, Estimated: 21 mL/min — ABNORMAL LOW (ref >60)
GFR, Estimated: 21 mL/min — ABNORMAL LOW (ref >60)
Glucose, Bld: 162 mg/dL — ABNORMAL HIGH (ref 70–99)
Glucose, Bld: 163 mg/dL — ABNORMAL HIGH (ref 70–99)
Glucose, Bld: 175 mg/dL — ABNORMAL HIGH (ref 70–99)
Glucose, Bld: 234 mg/dL — ABNORMAL HIGH (ref 70–99)
Glucose, Bld: 285 mg/dL — ABNORMAL HIGH (ref 70–99)
Potassium: 3.7 mmol/L (ref 3.5–5.1)
Potassium: 3.8 mmol/L (ref 3.5–5.1)
Potassium: 3.8 mmol/L (ref 3.5–5.1)
Potassium: 3.9 mmol/L (ref 3.5–5.1)
Potassium: 4 mmol/L (ref 3.5–5.1)
Sodium: 146 mmol/L — ABNORMAL HIGH (ref 135–145)
Sodium: 147 mmol/L — ABNORMAL HIGH (ref 135–145)
Sodium: 149 mmol/L — ABNORMAL HIGH (ref 135–145)
Sodium: 149 mmol/L — ABNORMAL HIGH (ref 135–145)
Sodium: 152 mmol/L — ABNORMAL HIGH (ref 135–145)

## 2023-09-05 LAB — GLUCOSE, CAPILLARY
Glucose-Capillary: 127 mg/dL — ABNORMAL HIGH (ref 70–99)
Glucose-Capillary: 144 mg/dL — ABNORMAL HIGH (ref 70–99)
Glucose-Capillary: 148 mg/dL — ABNORMAL HIGH (ref 70–99)
Glucose-Capillary: 153 mg/dL — ABNORMAL HIGH (ref 70–99)
Glucose-Capillary: 154 mg/dL — ABNORMAL HIGH (ref 70–99)
Glucose-Capillary: 155 mg/dL — ABNORMAL HIGH (ref 70–99)
Glucose-Capillary: 165 mg/dL — ABNORMAL HIGH (ref 70–99)
Glucose-Capillary: 172 mg/dL — ABNORMAL HIGH (ref 70–99)
Glucose-Capillary: 173 mg/dL — ABNORMAL HIGH (ref 70–99)
Glucose-Capillary: 176 mg/dL — ABNORMAL HIGH (ref 70–99)
Glucose-Capillary: 177 mg/dL — ABNORMAL HIGH (ref 70–99)
Glucose-Capillary: 183 mg/dL — ABNORMAL HIGH (ref 70–99)
Glucose-Capillary: 186 mg/dL — ABNORMAL HIGH (ref 70–99)
Glucose-Capillary: 191 mg/dL — ABNORMAL HIGH (ref 70–99)
Glucose-Capillary: 202 mg/dL — ABNORMAL HIGH (ref 70–99)
Glucose-Capillary: 210 mg/dL — ABNORMAL HIGH (ref 70–99)
Glucose-Capillary: 218 mg/dL — ABNORMAL HIGH (ref 70–99)
Glucose-Capillary: 223 mg/dL — ABNORMAL HIGH (ref 70–99)
Glucose-Capillary: 223 mg/dL — ABNORMAL HIGH (ref 70–99)
Glucose-Capillary: 226 mg/dL — ABNORMAL HIGH (ref 70–99)
Glucose-Capillary: 255 mg/dL — ABNORMAL HIGH (ref 70–99)
Glucose-Capillary: 269 mg/dL — ABNORMAL HIGH (ref 70–99)
Glucose-Capillary: 275 mg/dL — ABNORMAL HIGH (ref 70–99)
Glucose-Capillary: 336 mg/dL — ABNORMAL HIGH (ref 70–99)
Glucose-Capillary: 341 mg/dL — ABNORMAL HIGH (ref 70–99)

## 2023-09-05 LAB — CBC
HCT: 44.5 % (ref 39.0–52.0)
HCT: 41.6 % (ref 39.0–52.0)
Hemoglobin: 14.1 g/dL (ref 13.0–17.0)
Hemoglobin: 12.5 g/dL — ABNORMAL LOW (ref 13.0–17.0)
MCH: 28.7 pg (ref 26.0–34.0)
MCH: 28.5 pg (ref 26.0–34.0)
MCHC: 31.7 g/dL (ref 30.0–36.0)
MCHC: 30 g/dL (ref 30.0–36.0)
MCV: 90.6 fL (ref 80.0–100.0)
MCV: 95 fL (ref 80.0–100.0)
Platelets: 102 K/uL — ABNORMAL LOW (ref 150–400)
Platelets: 103 K/uL — ABNORMAL LOW (ref 150–400)
RBC: 4.91 MIL/uL (ref 4.22–5.81)
RBC: 4.38 MIL/uL (ref 4.22–5.81)
RDW: 12.8 % (ref 11.5–15.5)
RDW: 13.1 % (ref 11.5–15.5)
WBC: 13.1 K/uL — ABNORMAL HIGH (ref 4.0–10.5)
WBC: 19.6 K/uL — ABNORMAL HIGH (ref 4.0–10.5)
nRBC: 0 % (ref 0.0–0.2)
nRBC: 0 % (ref 0.0–0.2)

## 2023-09-05 LAB — MAGNESIUM
Magnesium: 3.3 mg/dL — ABNORMAL HIGH (ref 1.7–2.4)
Magnesium: 3.1 mg/dL — ABNORMAL HIGH (ref 1.7–2.4)

## 2023-09-05 LAB — PHOSPHORUS
Phosphorus: 2.4 mg/dL — ABNORMAL LOW (ref 2.5–4.6)
Phosphorus: 2.9 mg/dL (ref 2.5–4.6)

## 2023-09-05 MED ORDER — ARFORMOTEROL TARTRATE 15 MCG/2ML IN NEBU
15.0000 ug | INHALATION_SOLUTION | Freq: Two times a day (BID) | RESPIRATORY_TRACT | Status: DC
  Administered 2023-09-05 – 2023-09-16 (×21): 15 ug via RESPIRATORY_TRACT
  Filled 2023-09-05 (×22): qty 2

## 2023-09-05 MED ORDER — INSULIN ASPART 100 UNIT/ML IJ SOLN
2.0000 [IU] | INTRAMUSCULAR | Status: DC
  Administered 2023-09-06: 2 [IU] via SUBCUTANEOUS

## 2023-09-05 MED ORDER — AZITHROMYCIN 500 MG PO TABS
500.0000 mg | ORAL_TABLET | Freq: Every day | ORAL | Status: AC
  Administered 2023-09-06 – 2023-09-07 (×2): 500 mg via ORAL
  Filled 2023-09-05 (×2): qty 1

## 2023-09-05 MED ORDER — DEXTROSE 5 % IV BOLUS
1000.0000 mL | Freq: Once | INTRAVENOUS | Status: AC
  Administered 2023-09-05: 1000 mL via INTRAVENOUS

## 2023-09-05 MED ORDER — HEPARIN SODIUM (PORCINE) 5000 UNIT/ML IJ SOLN
5000.0000 [IU] | Freq: Three times a day (TID) | INTRAMUSCULAR | Status: DC
  Administered 2023-09-05 – 2023-09-16 (×33): 5000 [IU] via SUBCUTANEOUS
  Filled 2023-09-05 (×33): qty 1

## 2023-09-05 MED ORDER — ORAL CARE MOUTH RINSE
15.0000 mL | OROMUCOSAL | Status: DC
  Administered 2023-09-05: 15 mL via OROMUCOSAL

## 2023-09-05 MED ORDER — INSULIN GLARGINE-YFGN 100 UNIT/ML ~~LOC~~ SOLN
38.0000 [IU] | Freq: Two times a day (BID) | SUBCUTANEOUS | Status: DC
  Administered 2023-09-06: 38 [IU] via SUBCUTANEOUS
  Filled 2023-09-05 (×3): qty 0.38

## 2023-09-05 MED ORDER — DEXTROSE IN LACTATED RINGERS 5 % IV SOLN: INTRAVENOUS | Status: DC

## 2023-09-05 MED ORDER — POTASSIUM PHOSPHATES 15 MMOLE/5ML IV SOLN
10.0000 mmol | Freq: Once | INTRAVENOUS | Status: AC
  Administered 2023-09-05: 10 mmol via INTRAVENOUS
  Filled 2023-09-05: qty 3.33

## 2023-09-05 MED ORDER — BUDESONIDE 0.5 MG/2ML IN SUSP
0.5000 mg | Freq: Two times a day (BID) | RESPIRATORY_TRACT | Status: DC
  Administered 2023-09-05 – 2023-09-16 (×22): 0.5 mg via RESPIRATORY_TRACT
  Filled 2023-09-05 (×23): qty 2

## 2023-09-05 MED ORDER — PANTOPRAZOLE SODIUM 40 MG IV SOLR
40.0000 mg | INTRAVENOUS | Status: DC
  Administered 2023-09-06: 40 mg via INTRAVENOUS
  Filled 2023-09-05: qty 10

## 2023-09-05 MED ORDER — SODIUM CHLORIDE 3 % IN NEBU
4.0000 mL | INHALATION_SOLUTION | Freq: Two times a day (BID) | RESPIRATORY_TRACT | Status: AC
  Administered 2023-09-05 – 2023-09-07 (×6): 4 mL via RESPIRATORY_TRACT
  Filled 2023-09-05 (×2): qty 15
  Filled 2023-09-05: qty 4
  Filled 2023-09-05 (×3): qty 15

## 2023-09-05 MED ORDER — AZITHROMYCIN 500 MG PO TABS
500.0000 mg | ORAL_TABLET | Freq: Every day | ORAL | Status: DC
  Administered 2023-09-05: 500 mg via ORAL
  Filled 2023-09-05: qty 1

## 2023-09-05 MED ORDER — MORPHINE SULFATE (PF) 2 MG/ML IV SOLN
1.0000 mg | Freq: Once | INTRAVENOUS | Status: AC | PRN
  Administered 2023-09-06: 1 mg via INTRAVENOUS
  Filled 2023-09-05: qty 1

## 2023-09-05 MED ORDER — SODIUM CHLORIDE 0.9 % IV SOLN
500.0000 mg | Freq: Every day | INTRAVENOUS | Status: AC
  Filled 2023-09-05 (×2): qty 5

## 2023-09-05 MED ORDER — ORAL CARE MOUTH RINSE: 15.0000 mL | OROMUCOSAL | Status: DC | PRN

## 2023-09-05 NOTE — Progress Notes (Signed)
 NAME:  Jack Cunningham, MRN:  [id number], DOB:  1960/08/09, LOS: 3 ADMISSION DATE:  09/02/2023, CONSULTATION DATE:  09/02/23 REFERRING MD:  Flint POUR, PA, CHIEF COMPLAINT:  AMS   History of Present Illness:  HPI obtained from wife at bedside as pt remains encephalopathic.   30 yoM with PMH of recent dx of DM, HTN, asthma, OSA who presented by EMS from home after wife heard a thud, found on the floor asking for help up, progressively became unresponsive, GCS 3-4 with EMS.  Not on blood thinners. Given GCS and fall, was leveled as a trauma.  Wife reports he has not felt well for several weeks.  Recently diagnosed with DM ~ 1 month ago but has been unable to check CBGs at home due to meter error.  Wife reports polydipsia but otherwise poor PO intake, polyuria but over several days unable to void due to burning, some blurred vision, no BM in 2 weeks, and recent unintentional 28lb weight loss.  Episode of vomiting last evening.  EMS CBG 300s.  Wife denies any tobacco, ETOH, or illicit drug use.  In ER pt was more responsive, oriented to self, following simple commands, hemodynamically stable and normoxia otherwise.  Temp 90 currently on bairhugger.  Trauma scan negative for acute injuries- CTH/ cervical, XR pelvis, and CXR neg.  Labs noted for glucose > 1200, bicarb 8, BUN/ sCr 120/5.1, CK 277, lactic 5.3> 5.1, WBC 23.6, INR 1.3, iCa 1.06, ETOH neg. EKG NSR w/o acute ST changes.  ABG 7.062/ 25/ 108/ 7.2.  Treated with insulin  gtt, getting 2L LR, bicarb, cultures sent and started on empiric ceftriaxone  and flagyl .  UA pending.  Pt remains lethargic but oriented to self, following simple commands and denies pain.  PCCM called for admit.   Of note, additional chart not merged thus far.  MRN [id number]  Pertinent  Medical History  HTN, HLD, DM, asthma, OSA, ?MI at 89- wife unclear   Significant Hospital Events: Including procedures, antibiotic start and stop dates in addition to other pertinent events   7/3  Admit DKA 7/4 remained on insulin  drip, hypernatremia - remained on D5W 7/5 remained on insulin  drip and D5W due to hypernatremia, overnight spike fever, CT Chest/Abd/Pelv showing pneumonia. Started on zosyn  and linezolid .  Interim History / Subjective:   Lethargic this morning Wife and daughter at bedside  Objective    Blood pressure (!) 140/70, pulse (!) 109, temperature (!) 100.8 F (38.2 C), temperature source Oral, resp. rate (!) 31, height 5' 10 (1.778 m), weight 91.4 kg, SpO2 100%.        Intake/Output Summary (Last 24 hours) at 09/05/2023 0742 Last data filed at 09/05/2023 0600 Gross per 24 hour  Intake 4855.47 ml  Output 3975 ml  Net 880.47 ml   Filed Weights   09/02/23 0940 09/04/23 0321 09/05/23 0133  Weight: 93 kg 89.6 kg 91.4 kg    Examination: General:  ill appearing older male lying in bed in NAD HEENT: MM pink/dry, pupils reactive Neuro: lethargic, but able to converse, following commands CV: rr, NSR, no murmur PULM:  clear to auscultation, no wheezing GI: soft, bs+, NT GU: foley in place, small blood clots in urine Extremities: warm/dry, no LE edema  Skin: no rashes   Resolved problem list  Hyperkalemia without EKG changes 2/2 DKA Hypocalcemia  Severe AGMA/ lactic acidosis   Assessment and Plan   Sepsis due to RLL Pneumonia and pancreatitis - continue aggressive fluid resuscitaiton - CT Chest/Abd/Pelvis shows RLL  pneumonia - re-started back on zosyn  and linzeolid overnight. Add azithromycin  - D5W at 170mL/hr - LFTs not elevated, low concern for obstructive stone. Triglycerides not elevated. CT Abd not showing inflammation of the pancreas.  Recently dx DM now with DKA AKI 2/2 to severe dehydration +/- ARB Hypernatremia Hypokalemia - insulin  gtt per endotool  - Hgb A1c 13.5 on admission  - q6hr BMET - Renal US  unremarkable/CT Abd unremarkable - hold pta metformin/ glipizide  - Continue D5W fluids for free water deficit - replete  electrolytes  Acute Hypoxemic Resp Failure Pneumonia Concern for OSA - flutter valve, incentive spirometry - start hypertonic nebs - out of bed to chair - CPAP/Bipap during daytime naps and nightly  Acute Metabolic Encephalopathy - due to DKA and sepsis and likely underlying OSA - treatment as above - improving  Hematuria - concern for foley trauma - may need urology follow up  Unwitnessed fall - trauma scans neg, cleared  HTN HLD - hold pta losartan and statin  Hx of Asthma - no wheezing, normoxia - budesonide /brovana  nebs  Best Practice (right click and Reselect all SmartList Selections daily)   Diet/type: NPO DVT prophylaxis prophylactic heparin   Pressure ulcer(s): N/A GI prophylaxis: N/A Lines: N/A Foley:  Yes, and it is still needed Code Status:  full code Last date of multidisciplinary goals of care discussion [updated wife and patient at bedside today]  Labs   CBC: Recent Labs  Lab 09/02/23 0909 09/02/23 0914 09/02/23 0950 09/02/23 1006 09/02/23 2013 09/03/23 0140 09/03/23 0704 09/03/23 0929 09/03/23 1458 09/03/23 2224 09/05/23 0518  WBC 23.0*  --  23.6*  --  17.6* 14.5*  --   --   --   --  13.1*  NEUTROABS  --   --  20.2*  --   --   --   --   --   --   --   --   HGB 15.9   < > 15.3   < > 16.4 16.2 16.4 16.3 15.6 16.0 14.1  HCT 57.3*   < > 54.8*   < > 49.6 48.8 50.6 50.2 49.0 50.4 44.5  MCV 103.1*  --  102.6*  --  87.8 87.3  --   --   --   --  90.6  PLT 267  --  256  --  209 175 158  --   --   --  102*   < > = values in this interval not displayed.    Basic Metabolic Panel: Recent Labs  Lab 09/03/23 0140 09/03/23 0704 09/04/23 1059 09/04/23 1555 09/04/23 1853 09/04/23 2342 09/05/23 0331  NA 162*   < > 153* 152* 154* 149* 152*  K 4.0   < > 3.6 3.9 4.0 4.0 3.8  CL 121*   < > 112* 116* 115* 113* 117*  CO2 27   < > 26 24 27 24 26   GLUCOSE 261*   < > 163* 200* 189* 234* 175*  BUN 101*   < > 87* 86* 84* 80* 81*  CREATININE 3.73*   <  > 4.18* 4.06* 3.89* 3.34* 3.25*  CALCIUM  9.7   < > 8.5* 8.4* 8.7* 7.9* 8.2*  MG 4.0*  --   --   --   --   --  3.3*  PHOS  --   --   --   --   --   --  2.4*   < > = values in this interval not displayed.   GFR:  Estimated Creatinine Clearance: 26.8 mL/min (A) (by C-G formula based on SCr of 3.25 mg/dL (H)). Recent Labs  Lab 09/02/23 0915 09/02/23 0950 09/02/23 1057 09/02/23 1303 09/02/23 2013 09/03/23 0140 09/05/23 0518  WBC  --  23.6*  --   --  17.6* 14.5* 13.1*  LATICACIDVEN 5.3*  --  5.1* 2.7*  --   --   --     Liver Function Tests: Recent Labs  Lab 09/02/23 0909 09/02/23 1303 09/03/23 0704  AST 17 18 45*  ALT 42 39 38  ALKPHOS 65 57 52  BILITOT 2.6* 1.7* 1.1  PROT 7.6 6.8 7.4  ALBUMIN 3.5 3.3* 3.3*   Recent Labs  Lab 09/02/23 2013 09/03/23 1811  LIPASE 2,665* 582*   Recent Labs  Lab 09/03/23 0704  AMMONIA 61*    ABG    Component Value Date/Time   HCO3 7.2 (L) 09/02/2023 1006   TCO2 8 (L) 09/02/2023 1006   ACIDBASEDEF 22.0 (H) 09/02/2023 1006   O2SAT 95 09/02/2023 1006     Coagulation Profile: Recent Labs  Lab 09/02/23 0909 09/02/23 1025 09/03/23 0704  INR 1.3* 1.3* 1.1    Cardiac Enzymes: Recent Labs  Lab 09/02/23 0950  CKTOTAL 277    HbA1C: Hgb A1c MFr Bld  Date/Time Value Ref Range Status  09/02/2023 01:03 PM 13.5 (H) 4.8 - 5.6 % Final    Comment:    (NOTE)         Prediabetes: 5.7 - 6.4         Diabetes: >6.4         Glycemic control for adults with diabetes: <7.0     CBG: Recent Labs  Lab 09/05/23 0337 09/05/23 0439 09/05/23 0546 09/05/23 0627 09/05/23 0721  GLUCAP 183* 176* 177* 186* 148*       Critical care time: 35 mins       Dorn Chill, MD  Pulmonary & Critical Care Office: [phone]   See Amion for personal pager PCCM on call pager [phone] until 7pm. Please call Elink 7p-7a. [phone]

## 2023-09-05 NOTE — Progress Notes (Signed)
 eLink Physician-Brief Progress Note Patient Name: Jack Cunningham DOB: 1961-02-01 MRN: [id number]   Date of Service  09/05/2023  HPI/Events of Note  Patient complaining of penile pain. Has a foley and urine been bloody but starting to clear up.  Patient seen on BiPap and does not appear to be in distress.  eICU Interventions  Suspect trauma causing pain Trial of one time dose morphine  1 mg IV     Intervention Category Intermediate Interventions: Pain - evaluation and management  Damien DASEN Ladarion Munyon 09/05/2023, 11:22 PM

## 2023-09-05 NOTE — Plan of Care (Signed)
  Problem: Clinical Measurements: Goal: Diagnostic test results will improve Outcome: Progressing Goal: Cardiovascular complication will be avoided Outcome: Progressing   Problem: Clinical Measurements: Goal: Will remain free from infection Outcome: Not Progressing Goal: Respiratory complications will improve Outcome: Not Progressing   Problem: Activity: Goal: Risk for activity intolerance will decrease Outcome: Not Progressing   Problem: Nutrition: Goal: Adequate nutrition will be maintained Outcome: Not Progressing

## 2023-09-05 NOTE — Progress Notes (Signed)
 eLink Physician-Brief Progress Note Patient Name: Jack Cunningham DOB: November 05, 1960 MRN: [id number]   Date of Service  09/05/2023  HPI/Events of Note  Endotool asking to transition off insulin  drip CO2 25, AG 10, Cl 111  eICU Interventions  Transition endotool ordered Discussed with BSRN. Insulin  drip to be discontinued 2 hours after transition initiated     Intervention Category Intermediate Interventions: Hyperglycemia - evaluation and treatment  Jack Cunningham Jack Cunningham 09/05/2023, 11:58 PM

## 2023-09-06 ENCOUNTER — Other Ambulatory Visit (HOSPITAL_COMMUNITY): Payer: Self-pay

## 2023-09-06 ENCOUNTER — Telehealth (HOSPITAL_COMMUNITY): Payer: Self-pay | Admitting: Pharmacy Technician

## 2023-09-06 DIAGNOSIS — J9601 Acute respiratory failure with hypoxia: Secondary | ICD-10-CM | POA: Diagnosis not present

## 2023-09-06 DIAGNOSIS — K859 Acute pancreatitis without necrosis or infection, unspecified: Secondary | ICD-10-CM | POA: Diagnosis not present

## 2023-09-06 DIAGNOSIS — A419 Sepsis, unspecified organism: Secondary | ICD-10-CM | POA: Diagnosis not present

## 2023-09-06 DIAGNOSIS — J189 Pneumonia, unspecified organism: Secondary | ICD-10-CM | POA: Diagnosis not present

## 2023-09-06 LAB — CBC
HCT: 36.5 % — ABNORMAL LOW (ref 39.0–52.0)
Hemoglobin: 11.4 g/dL — ABNORMAL LOW (ref 13.0–17.0)
MCH: 29.2 pg (ref 26.0–34.0)
MCHC: 31.2 g/dL (ref 30.0–36.0)
MCV: 93.4 fL (ref 80.0–100.0)
Platelets: 110 K/uL — ABNORMAL LOW (ref 150–400)
RBC: 3.91 MIL/uL — ABNORMAL LOW (ref 4.22–5.81)
RDW: 12.9 % (ref 11.5–15.5)
WBC: 21.2 K/uL — ABNORMAL HIGH (ref 4.0–10.5)
nRBC: 0 % (ref 0.0–0.2)

## 2023-09-06 LAB — GLUCOSE, CAPILLARY
Glucose-Capillary: 136 mg/dL — ABNORMAL HIGH (ref 70–99)
Glucose-Capillary: 139 mg/dL — ABNORMAL HIGH (ref 70–99)
Glucose-Capillary: 161 mg/dL — ABNORMAL HIGH (ref 70–99)
Glucose-Capillary: 176 mg/dL — ABNORMAL HIGH (ref 70–99)
Glucose-Capillary: 178 mg/dL — ABNORMAL HIGH (ref 70–99)
Glucose-Capillary: 257 mg/dL — ABNORMAL HIGH (ref 70–99)
Glucose-Capillary: 293 mg/dL — ABNORMAL HIGH (ref 70–99)
Glucose-Capillary: 328 mg/dL — ABNORMAL HIGH (ref 70–99)
Glucose-Capillary: 333 mg/dL — ABNORMAL HIGH (ref 70–99)
Glucose-Capillary: 334 mg/dL — ABNORMAL HIGH (ref 70–99)
Glucose-Capillary: 385 mg/dL — ABNORMAL HIGH (ref 70–99)

## 2023-09-06 LAB — BASIC METABOLIC PANEL WITH GFR
Anion gap: 10 (ref 5–15)
Anion gap: 10 (ref 5–15)
BUN: 65 mg/dL — ABNORMAL HIGH (ref 8–23)
BUN: 76 mg/dL — ABNORMAL HIGH (ref 8–23)
CO2: 28 mmol/L (ref 22–32)
CO2: 27 mmol/L (ref 22–32)
Calcium: 8.2 mg/dL — ABNORMAL LOW (ref 8.9–10.3)
Calcium: 8.3 mg/dL — ABNORMAL LOW (ref 8.9–10.3)
Chloride: 114 mmol/L — ABNORMAL HIGH (ref 98–111)
Chloride: 113 mmol/L — ABNORMAL HIGH (ref 98–111)
Creatinine, Ser: 2.96 mg/dL — ABNORMAL HIGH (ref 0.61–1.24)
Creatinine, Ser: 2.91 mg/dL — ABNORMAL HIGH (ref 0.61–1.24)
GFR, Estimated: 23 mL/min — ABNORMAL LOW (ref >60)
GFR, Estimated: 24 mL/min — ABNORMAL LOW (ref >60)
Glucose, Bld: 144 mg/dL — ABNORMAL HIGH (ref 70–99)
Glucose, Bld: 305 mg/dL — ABNORMAL HIGH (ref 70–99)
Potassium: 3.8 mmol/L (ref 3.5–5.1)
Potassium: 3.8 mmol/L (ref 3.5–5.1)
Sodium: 152 mmol/L — ABNORMAL HIGH (ref 135–145)
Sodium: 150 mmol/L — ABNORMAL HIGH (ref 135–145)

## 2023-09-06 MED ORDER — ORAL CARE MOUTH RINSE
15.0000 mL | OROMUCOSAL | Status: DC
  Administered 2023-09-06 – 2023-09-16 (×30): 15 mL via OROMUCOSAL

## 2023-09-06 MED ORDER — LIDOCAINE HCL URETHRAL/MUCOSAL 2 % EX GEL
1.0000 | Freq: Once | CUTANEOUS | Status: AC
  Administered 2023-09-06: 1 via URETHRAL
  Filled 2023-09-06 (×2): qty 6

## 2023-09-06 MED ORDER — INSULIN ASPART 100 UNIT/ML IJ SOLN
10.0000 [IU] | Freq: Three times a day (TID) | INTRAMUSCULAR | Status: DC
  Administered 2023-09-06 (×2): 10 [IU] via SUBCUTANEOUS

## 2023-09-06 MED ORDER — INSULIN GLARGINE-YFGN 100 UNIT/ML ~~LOC~~ SOLN
38.0000 [IU] | Freq: Two times a day (BID) | SUBCUTANEOUS | Status: DC
  Administered 2023-09-06: 38 [IU] via SUBCUTANEOUS
  Filled 2023-09-06 (×2): qty 0.38

## 2023-09-06 MED ORDER — INSULIN ASPART 100 UNIT/ML IJ SOLN
0.0000 [IU] | Freq: Every day | INTRAMUSCULAR | Status: DC
  Administered 2023-09-06: 3 [IU] via SUBCUTANEOUS

## 2023-09-06 MED ORDER — INSULIN ASPART 100 UNIT/ML IJ SOLN
0.0000 [IU] | Freq: Three times a day (TID) | INTRAMUSCULAR | Status: DC
  Administered 2023-09-06: 11 [IU] via SUBCUTANEOUS
  Administered 2023-09-06: 15 [IU] via SUBCUTANEOUS
  Administered 2023-09-06: 20 [IU] via SUBCUTANEOUS

## 2023-09-06 MED ORDER — BIOTENE DRY MOUTH MT LIQD
15.0000 mL | OROMUCOSAL | Status: DC | PRN
  Administered 2023-09-06: 15 mL via OROMUCOSAL

## 2023-09-06 MED ORDER — ORAL CARE MOUTH RINSE: 15.0000 mL | OROMUCOSAL | Status: DC | PRN

## 2023-09-06 MED ORDER — INSULIN STARTER KIT- PEN NEEDLES (ENGLISH)
1.0000 | Freq: Once | Status: DC
  Filled 2023-09-06 (×2): qty 1

## 2023-09-06 NOTE — Evaluation (Signed)
 Occupational Therapy Evaluation Patient Details Name: Jack Cunningham MRN: [id number] DOB: 11/01/1960 Today's Date: 09/06/2023   History of Present Illness   63 y/o male presenting to ED after being found on the floor and progressively unresponsive, GCS 3-4 with EMS. More responsive in ED,but hypothermic with glucose >1200 Admitted for DKA PMH of recent dx of DM, HTN, asthma, OSA     Clinical Impressions PTA, pt lives with spouse, typically completely Independent with ADLs, IADLs and mobility without AD. Pt presents now with deficits in cognition, dynamic standing balance and endurance though overall functional. Pt received on BSC, able to complete transfers without AD with CGA and ADLs with overall Min A. Some slower processing and problem solving noted but anticipate continued improvements acutely. Will follow while admitted but anticipate pt to progress beyond needing follow up OT at discharge.     If plan is discharge home, recommend the following:   Assistance with cooking/housework;Direct supervision/assist for medications management;Direct supervision/assist for financial management;Assist for transportation;Supervision due to cognitive status     Functional Status Assessment   Patient has had a recent decline in their functional status and demonstrates the ability to make significant improvements in function in a reasonable and predictable amount of time.     Equipment Recommendations   Other (comment) (Rolling walker)     Recommendations for Other Services         Precautions/Restrictions   Precautions Precautions: Fall Recall of Precautions/Restrictions: Intact Restrictions Weight Bearing Restrictions Per Provider Order: No     Mobility Bed Mobility               General bed mobility comments: received on BSC    Transfers Overall transfer level: Needs assistance Equipment used: None Transfers: Sit to/from Stand, Bed to chair/wheelchair/BSC Sit  to Stand: Contact guard assist     Step pivot transfers: Contact guard assist     General transfer comment: from [facility] back to recliner      Balance Overall balance assessment: Needs assistance Sitting-balance support: Feet supported, No upper extremity supported Sitting balance-Leahy Scale: Good     Standing balance support: During functional activity, Reliant on assistive device for balance, Bilateral upper extremity supported Standing balance-Leahy Scale: Fair                             ADL either performed or assessed with clinical judgement   ADL Overall ADL's : Needs assistance/impaired Eating/Feeding: Independent   Grooming: Set up;Sitting;Wash/dry face   Upper Body Bathing: Set up;Sitting;Supervision/ safety   Lower Body Bathing: Minimal assistance;Sitting/lateral leans;Sit to/from stand   Upper Body Dressing : Set up;Supervision/safety;Sitting   Lower Body Dressing: Minimal assistance;Sit to/from stand;Sitting/lateral leans   Toilet Transfer: Contact guard assist;Stand-pivot;BSC/3in1 Statistician Details (indicate cue type and reason): able to stand from [facility] fairly easily without AD Toileting- Clothing Manipulation and Hygiene: Minimal assistance;Sit to/from stand;Sitting/lateral lean Toileting - Clothing Manipulation Details (indicate cue type and reason): assist for hygiene to ensure cleanliness             Vision Ability to See in Adequate Light: 0 Adequate Patient Visual Report: No change from baseline Vision Assessment?: No apparent visual deficits     Perception         Praxis         Pertinent Vitals/Pain Pain Assessment Pain Assessment: No/denies pain     Extremity/Trunk Assessment Upper Extremity Assessment Upper Extremity Assessment: Overall WFL for tasks assessed;Right  hand dominant   Lower Extremity Assessment Lower Extremity Assessment: Defer to PT evaluation RLE Deficits / Details: AROM WFL, increased time to  perform and to build strength against pressure, ultimately 4/5 strength RLE Sensation: WNL RLE Coordination: decreased fine motor LLE Deficits / Details: AROM WFL, increased time to perform and to build strength against pressure, ultimately 4/5 strength LLE Sensation: WNL LLE Coordination: decreased fine motor   Cervical / Trunk Assessment Cervical / Trunk Assessment: Normal   Communication Communication Communication: No apparent difficulties   Cognition Arousal: Alert Behavior During Therapy: WFL for tasks assessed/performed, Flat affect Cognition: Cognition impaired     Awareness: Intellectual awareness intact, Online awareness impaired Memory impairment (select all impairments): Working memory Attention impairment (select first level of impairment): Sustained attention, Selective attention Executive functioning impairment (select all impairments): Reasoning, Organization, Problem solving OT - Cognition Comments: pleasant,able to answer orientation questions + a few extras (asked pt what month/year it was but pt answering name, birthday, president and race along with requested orientation questions). following commands though processing slightly slower than anticipate baseline is. deficits noted in regular conversation                 Following commands: Impaired Following commands impaired: Follows multi-step commands with increased time, Follows one step commands with increased time     Cueing  General Comments   Cueing Techniques: Verbal cues;Gestural cues;Tactile cues;Visual cues  Wife at bedside but for ADL assist   Exercises     Shoulder Instructions      Home Living Family/patient expects to be discharged to:: Private residence Living Arrangements: Spouse/significant other;Children Available Help at Discharge: Family;Available 24 hours/day Type of Home: House Home Access: Stairs to enter [employer] of Steps: 2   Home Layout: One level      Bathroom Shower/Tub: Producer, television/film/video: Standard Bathroom Accessibility: Yes   Home Equipment: None          Prior Functioning/Environment Prior Level of Function : Independent/Modified Independent             Mobility Comments: ambulated limited community distances without AD ADLs Comments: pt is independent in ADLs, family provides for iADLs. does not drive but does report push mowing the yard    OT Problem List: Decreased activity tolerance;Impaired balance (sitting and/or standing);Decreased cognition;Decreased knowledge of use of DME or AE;Decreased strength   OT Treatment/Interventions: Self-care/ADL training;Therapeutic exercise;Energy conservation;DME and/or AE instruction;Therapeutic activities;Patient/family education;Balance training      OT Goals(Current goals can be found in the care plan section)   Acute Rehab OT Goals Patient Stated Goal: be able to go home, for stomach to stop being upset OT Goal Formulation: With patient/family Time For Goal Achievement: 09/20/23 Potential to Achieve Goals: Good   OT Frequency:  Min 2X/week    Co-evaluation              AM-PAC OT 6 Clicks Daily Activity     Outcome Measure Help from another person eating meals?: None Help from another person taking care of personal grooming?: A Little Help from another person toileting, which includes using toliet, bedpan, or urinal?: A Little Help from another person bathing (including washing, rinsing, drying)?: A Little Help from another person to put on and taking off regular upper body clothing?: A Little Help from another person to put on and taking off regular lower body clothing?: A Little 6 Click Score: 19   End of Session Nurse Communication: Mobility status  Activity  Tolerance: Patient tolerated treatment well Patient left: in chair;with call bell/phone within reach;with chair alarm set  OT Visit Diagnosis: Other symptoms and signs involving  cognitive function;Muscle weakness (generalized) (M62.81)                Time: 8663-8641 OT Time Calculation (min): 22 min Charges:  OT General Charges $OT Visit: 1 Visit OT Evaluation $OT Eval Moderate Complexity: 1 Mod  Mliss NOVAK, OTR/L Acute Rehab Services Office: [phone]   Mliss Fish 09/06/2023, 2:11 PM

## 2023-09-06 NOTE — Telephone Encounter (Signed)
 Patient Product/process development scientist completed.    The patient is insured through [employer].     Ran test claim for Lantus  Pen and the current 30 day co-pay is $4.00.  Ran test claim for Novolog  FlexPen and the current 30 day co-pay is $4.00.  Ran test claim for Dexcom G7 Sensor and Requires Prior Authorization  Ran test claim for [employer] 3 Plus Sensor and Requires Prior Authorization  This test claim was processed through [employer]- copay amounts may vary at other pharmacies due to [employer], or as the patient moves through the different stages of their insurance plan.     Reyes Sharps, CPHT Pharmacy Technician III Certified Patient Advocate [facility] Patient Advocate Team Direct Number: [phone]  Fax: [phone]

## 2023-09-06 NOTE — Evaluation (Signed)
 Physical Therapy Evaluation Patient Details Name: Jack Cunningham MRN: [id number] DOB: 1960-10-03 Today's Date: 09/06/2023  History of Present Illness  89 yoM presented to ED via EMS from home after wife heard a thud, pt found on the floor progressively became unresponsive, GCS 3-4 with EMS. More responsive in ED,but hypothermic with glucose >1200 Admitted for DKA PMH of recent dx of DM, HTN, asthma, OSA  Clinical Impression  PTA pt living with wife and 4 children in single story home with 2 steps to enter. Pt reports independence with limited community ambulation without AD, and independence with ADLs, pt reports family provides for iADLs and driving. Pt is currently limited in safe mobility, due to increased processing time for commands, and decreased coordination and sequencing. Pt is min A for transfers and min A progressing to CGA for ambulation with RW in hallway. PT recommending HHPT and RW at discharge. PT recommending OT consult and will continue to follow acutely.        If plan is discharge home, recommend the following: A little help with walking and/or transfers;A little help with bathing/dressing/bathroom;Assistance with cooking/housework;Direct supervision/assist for medications management;Direct supervision/assist for financial management;Assist for transportation;Help with stairs or ramp for entrance   Can travel by private vehicle     Yes   Equipment Recommendations Rolling walker (2 wheels)  Recommendations for Other Services  OT consult    Functional Status Assessment Patient has had a recent decline in their functional status and demonstrates the ability to make significant improvements in function in a reasonable and predictable amount of time.     Precautions / Restrictions Precautions Precautions: Fall Recall of Precautions/Restrictions: Intact Restrictions Weight Bearing Restrictions Per Provider Order: No      Mobility  Bed Mobility                General bed mobility comments: OOB in recliner    Transfers Overall transfer level: Needs assistance   Transfers: Sit to/from Stand Sit to Stand: Contact guard assist           General transfer comment: increased time and effort but able to come to standing with contact guard for safety, vc for hand placement for power up    Ambulation/Gait Ambulation/Gait assistance: Contact guard assist, Min assist Gait Distance (Feet): 175 Feet Assistive device: Rolling walker (2 wheels) Gait Pattern/deviations: Step-through pattern, Trunk flexed, Shuffle Gait velocity: slowed Gait velocity interpretation: 1.31 - 2.62 ft/sec, indicative of limited community ambulator   General Gait Details: min A progressing to CGA for safety to walk around the unit. pt needs maximal cuing for navigation around obstacles in hallway, increased cuing for upright posture and proximity to RW      Balance Overall balance assessment: Needs assistance Sitting-balance support: Feet supported, No upper extremity supported Sitting balance-Leahy Scale: Fair     Standing balance support: During functional activity, Reliant on assistive device for balance, Bilateral upper extremity supported Standing balance-Leahy Scale: Poor Standing balance comment: benefits from BUE support with dynamic activity                             Pertinent Vitals/Pain Pain Assessment Pain Assessment: No/denies pain    Home Living Family/patient expects to be discharged to:: Private residence Living Arrangements: Spouse/significant other;Children Available Help at Discharge: Family;Available 24 hours/day Type of Home: House Home Access: Stairs to enter   [employer] of Steps: 2   Home Layout: One level Home Equipment: None  Prior Function Prior Level of Function : Independent/Modified Independent             Mobility Comments: ambulated limited community distances without AD. no longer  drives ADLs Comments: pt is independent in ADLs, family provides for iADLs     Extremity/Trunk Assessment   Upper Extremity Assessment Upper Extremity Assessment: Defer to OT evaluation    Lower Extremity Assessment Lower Extremity Assessment: RLE deficits/detail;LLE deficits/detail RLE Deficits / Details: AROM WFL, increased time to perform and to build strength against pressure, ultimately 4/5 strength RLE Sensation: WNL RLE Coordination: decreased fine motor LLE Deficits / Details: AROM WFL, increased time to perform and to build strength against pressure, ultimately 4/5 strength LLE Sensation: WNL LLE Coordination: decreased fine motor    Cervical / Trunk Assessment Cervical / Trunk Assessment: Normal  Communication   Communication Communication: No apparent difficulties    Cognition Arousal: Alert Behavior During Therapy: WFL for tasks assessed/performed, Flat affect   PT - Cognitive impairments: Orientation, Sequencing, Problem solving   Orientation impairments:  (able to state Ox4 from memory without being asked, but along with the standard questions he reported Regan as the president, looked confused when PT stated Trump)                   PT - Cognition Comments: Pt with slowed response Following commands: Impaired Following commands impaired: Follows multi-step commands with increased time, Follows one step commands with increased time     Cueing Cueing Techniques: Verbal cues, Gestural cues, Tactile cues, Visual cues     General Comments General comments (skin integrity, edema, etc.): VSS on RA        Assessment/Plan    PT Assessment Patient needs continued PT services  PT Problem List Decreased balance;Decreased mobility;Decreased coordination;Decreased cognition;Decreased knowledge of use of DME       PT Treatment Interventions DME instruction;Gait training;Stair training;Functional mobility training;Therapeutic activities;Therapeutic  exercise;Balance training;Cognitive remediation;Patient/family education    PT Goals (Current goals can be found in the Care Plan section)  Acute Rehab PT Goals PT Goal Formulation: With patient Time For Goal Achievement: 09/20/23 Potential to Achieve Goals: Good    Frequency Min 3X/week        AM-PAC PT 6 Clicks Mobility  Outcome Measure Help needed turning from your back to your side while in a flat bed without using bedrails?: None Help needed moving from lying on your back to sitting on the side of a flat bed without using bedrails?: A Little Help needed moving to and from a bed to a chair (including a wheelchair)?: A Little Help needed standing up from a chair using your arms (e.g., wheelchair or bedside chair)?: A Little Help needed to walk in hospital room?: A Little Help needed climbing 3-5 steps with a railing? : A Lot 6 Click Score: 18    End of Session Equipment Utilized During Treatment: Gait belt Activity Tolerance: Patient tolerated treatment well Patient left: in chair;with call bell/phone within reach;with chair alarm set Nurse Communication: Mobility status PT Visit Diagnosis: Other abnormalities of gait and mobility (R26.89);Other symptoms and signs involving the nervous system (R29.898) (delayed processing)    Time: 8978-8941 PT Time Calculation (min) (ACUTE ONLY): 37 min   Charges:   PT Evaluation $PT Eval Moderate Complexity: 1 Mod PT Treatments $Gait Training: 8-22 mins PT General Charges $$ ACUTE PT VISIT: 1 Visit         Shaune Westfall B. Van Fleet PT, DPT Acute Rehabilitation Services Please use  secure chat or  Call Office [phone]   Almarie KATHEE Salinas [facility] 09/06/2023, 12:42 PM

## 2023-09-06 NOTE — Inpatient Diabetes Management (Addendum)
 Inpatient Diabetes Program Recommendations  AACE/ADA: New Consensus Statement on Inpatient Glycemic Control (2015)  Target Ranges:  Prepandial:   less than 140 mg/dL      Peak postprandial:   less than 180 mg/dL (1-2 hours)      Critically ill patients:  140 - 180 mg/dL    Latest Reference Range & Units 09/02/23 09:09  Glucose 70 - 99 mg/dL >8,799 (HH)  (HH): Data is critically high  Latest Reference Range & Units 09/02/23 13:03  Hemoglobin A1C 4.8 - 5.6 % 13.5 (H)  341 mg/dl  (H): Data is abnormally high  Latest Reference Range & Units 09/06/23 00:26 09/06/23 01:25 09/06/23 02:38 09/06/23 03:47 09/06/23 05:21 09/06/23 07:48  Glucose-Capillary 70 - 99 mg/dL 821 (H)  38 units Semglee  @0100  161 (H) 136 (H)  IV Insulin   Drip Stopped @0301  139 (H)  2 units Novolog   176 (H) 257 (H)  11 units Novolog    (H): Data is abnormally high   Admit with:  DKA  Sepsis due to RLL Pneumonia and pancreatitis Hypernatremia Hypokalemia   History: Recent Diagnosis Diabetes  Home DM Meds: Glipizide 5 mg daily        Metformin 500 mg BID  Current Orders: Novolog  Resistant Correction Scale/ SSI (0-20 units) TID AC + HS     Novolog  10 units TID with meals     MD- Note 38 units Semglee  given at 1am to transition to SQ Insulin   Currently only has orders for Novolog  SSi and Novolog  meal coverage  Please consider adding back daily Semglee  28 units QHS (0.3 units/kg)--Start at bedtime tonight    Addendum 1:30pm--Met w/ pt and his wife at bedside this afternoon.  Wife provided most of info.  PCP at [facility] on [address] (Dr. MARLA) diagnosed pt with DM about 3 weeks ago (was previously pre-diabetes).  Given Rxs for Metformin and Glipizide.  I reviewed pt's current A1c of 13.5% with pt and wife (what it means, goal A1c, etc).  Reviewed goal CBGs for home.  Also discussed with patient diagnosis of DKA (pathophysiology), treatment of DKA, lab results, and transition plan to SQ insulin   regimen.  Discussed with pt and wife that pt will likely need insulin  for home given A1c is 13.5%, however, MD will ultimately make the discharge medication decisions.  I made an appt with pt's wife to come back at 1pm Tuesday 07/08 to demonstrate how to use insulin  pen and to review diabetes further.  Pt and wife appreciative of visit.        --Will follow patient during hospitalization--  Adina Rudolpho Arrow RN, MSN, CDCES Diabetes Coordinator Inpatient Glycemic Control Team Team Pager: [phone] (8a-5p)

## 2023-09-06 NOTE — Telephone Encounter (Addendum)
 Pharmacy Patient Advocate Encounter   Received notification from Inpatient Request that prior authorization for FreeStyle Libre 3 Plus Sensor is required/requested.   Insurance verification completed.   The patient is insured through [employer] .   Per test claim: PA required; PA submitted to above mentioned insurance via CoverMyMeds Key/confirmation #/EOC [id number] Status is pending

## 2023-09-06 NOTE — Progress Notes (Signed)
 NAME:  Jack Cunningham, MRN:  [id number], DOB:  Nov 04, 1960, LOS: 4 ADMISSION DATE:  09/02/2023, CONSULTATION DATE:  09/02/23 REFERRING MD:  Flint POUR, PA, CHIEF COMPLAINT:  AMS   History of Present Illness:  HPI obtained from wife at bedside as pt remains encephalopathic.    82 yoM with PMH of recent dx of DM, HTN, asthma, OSA who presented by EMS from home after wife heard a thud, found on the floor asking for help up, progressively became unresponsive, GCS 3-4 with EMS.  Not on blood thinners. Given GCS and fall, was leveled as a trauma.  Wife reports he has not felt well for several weeks.  Recently diagnosed with DM ~ 1 month ago but has been unable to check CBGs at home due to meter error.  Wife reports polydipsia but otherwise poor PO intake, polyuria but over several days unable to void due to burning, some blurred vision, no BM in 2 weeks, and recent unintentional 28lb weight loss.  Episode of vomiting last evening.  EMS CBG 300s.  Wife denies any tobacco, ETOH, or illicit drug use.   In ER pt was more responsive, oriented to self, following simple commands, hemodynamically stable and normoxia otherwise.  Temp 90 currently on bairhugger.  Trauma scan negative for acute injuries- CTH/ cervical, XR pelvis, and CXR neg.  Labs noted for glucose > 1200, bicarb 8, BUN/ sCr 120/5.1, CK 277, lactic 5.3> 5.1, WBC 23.6, INR 1.3, iCa 1.06, ETOH neg. EKG NSR w/o acute ST changes.  ABG 7.062/ 25/ 108/ 7.2.  Treated with insulin  gtt, getting 2L LR, bicarb, cultures sent and started on empiric ceftriaxone  and flagyl .  UA pending.  Pt remains lethargic but oriented to self, following simple commands and denies pain.  PCCM called for admit.    Of note, additional chart not merged thus far.  MRN [id number]  Pertinent  Medical History  HTN, HLD, DM, asthma, OSA, ?MI at 23- wife unclear   Significant Hospital Events: Including procedures, antibiotic start and stop dates in addition to other pertinent events   7/3  Admit DKA 7/4 remained on insulin  drip, hypernatremia - remained on D5W 7/5 remained on insulin  drip and D5W due to hypernatremia, overnight spike fever, CT Chest/Abd/Pelv showing pneumonia. Started on zosyn  and linezolid . 7/6 endotool stopped  Interim History / Subjective:  Endotool stopped overnight. Fever yesterday 101.9. He does not share acute complaints other than wanting coffee.  Objective    Blood pressure 114/63, pulse (!) 102, temperature 99.4 F (37.4 C), temperature source Axillary, resp. rate (!) 21, height 5' 10 (1.778 m), weight 93.2 kg, SpO2 100%.    FiO2 (%):  [30 %] 30 % PEEP:  [5 cmH20] 5 cmH20 Pressure Support:  [10 cmH20] 10 cmH20   Intake/Output Summary (Last 24 hours) at 09/06/2023 0926 Last data filed at 09/06/2023 9188 Gross per 24 hour  Intake 5394.49 ml  Output 3625 ml  Net 1769.49 ml   Filed Weights   09/04/23 0321 09/05/23 0133 09/06/23 0500  Weight: 89.6 kg 91.4 kg 93.2 kg    Examination: General:  Older male lying in bed in NAD HEENT: MM pink/moist, pupils reactive Neuro: Awake, following commands CV: RRR no MRG PULM:  CTABL GI: soft, bs+, NT GU: foley in place Extremities: warm/dry, reduced turgor, no LE edema  Skin: no rashes  Resolved problem list  Hyperkalemia without EKG changes 2/2 DKA Hypocalcemia  Severe AGMA/ lactic acidosis Acute Metabolic Encephalopathy - due to DKA and sepsis and  likely underlying OSA Unwitnessed fall - trauma scans neg, cleared  Assessment and Plan   Sepsis due to RLL Pneumonia and pancreatitis AHRF Concern OSA No shock, not needing pressers. Blood cultures remain negative. CT Chest/Abd/Pelvis shows RLL pneumonia. Continues with fevers and leukocytosis. Stable on 2L [place]. - Zosyn  (day 3) and Linezolid  (day 4) - likely 7 day course of both - Azithromycin  (day 2 of 3) - oral fluid resuscitation - flutter valve, incentive spirometry - start hypertonic nebs - out of bed to chair - CPAP/Bipap during daytime  naps and nightly  DKA now resolved  Recently dx DM with A1c 13.5 AKI 2/2 to severe dehydration +/- ARB Hypernatremia Hypokalemia Off endotool overnight, now eating. High insulin  needs. Currently 38 glargine BID, 10 aspart with each meal, and resistant SSI. Sodium worse again today to 154 after 146 yesterday. - Oral fluid resuscitation - hold pta metformin/ glipizide  - trend sugars goal 140-180    Hematuria - concern for foley trauma - may need urology follow up    HTN HLD - hold pta losartan and statin   Hx of Asthma - no wheezing, normoxia - budesonide /brovana  nebs  Stable for transfer out of ICU today  Best Practice (right click and Reselect all SmartList Selections daily)   Diet/type: Carb modified DVT prophylaxis prophylactic heparin   Pressure ulcer(s): N/A GI prophylaxis: N/A Lines: N/A Foley:  Yes, and it is still needed Code Status:  full code Last date of multidisciplinary goals of care discussion [7/6]  Labs   CBC: Recent Labs  Lab 09/02/23 0950 09/02/23 1006 09/02/23 2013 09/03/23 0140 09/03/23 0704 09/03/23 0929 09/03/23 1458 09/03/23 2224 09/05/23 0518 09/05/23 2201  WBC 23.6*  --  17.6* 14.5*  --   --   --   --  13.1* 19.6*  NEUTROABS 20.2*  --   --   --   --   --   --   --   --   --   HGB 15.3   < > 16.4 16.2 16.4 16.3 15.6 16.0 14.1 12.5*  HCT 54.8*   < > 49.6 48.8 50.6 50.2 49.0 50.4 44.5 41.6  MCV 102.6*  --  87.8 87.3  --   --   --   --  90.6 95.0  PLT 256  --  209 175 158  --   --   --  102* 103*   < > = values in this interval not displayed.   Basic Metabolic Panel: Recent Labs  Lab 09/03/23 0140 09/03/23 0704 09/05/23 0331 09/05/23 0948 09/05/23 1531 09/05/23 2201 09/06/23 0347  NA 162*   < > 152* 147* 149* 146* 152*  K 4.0   < > 3.8 3.7 3.9 3.8 3.8  CL 121*   < > 117* 110 114* 111 114*  CO2 27   < > 26 25 26 25 28   GLUCOSE 261*   < > 175* 285* 162* 163* 144*  BUN 101*   < > 81* 76* 72* 68* 65*  CREATININE 3.73*   < >  3.25* 3.35* 3.22* 3.17* 2.96*  CALCIUM  9.7   < > 8.2* 7.9* 8.0* 7.8* 8.2*  MG 4.0*  --  3.3*  --   --  3.1*  --   PHOS  --   --  2.4*  --   --  2.9  --    < > = values in this interval not displayed.   GFR: Estimated Creatinine Clearance: 29.7 mL/min (A) (by C-G  formula based on SCr of 2.96 mg/dL (H)). Recent Labs  Lab 09/02/23 0915 09/02/23 0950 09/02/23 1057 09/02/23 1303 09/02/23 2013 09/03/23 0140 09/05/23 0518 09/05/23 2201  WBC  --    < >  --   --  17.6* 14.5* 13.1* 19.6*  LATICACIDVEN 5.3*  --  5.1* 2.7*  --   --   --   --    < > = values in this interval not displayed.   Liver Function Tests: Recent Labs  Lab 09/02/23 0909 09/02/23 1303 09/03/23 0704  AST 17 18 45*  ALT 42 39 38  ALKPHOS 65 57 52  BILITOT 2.6* 1.7* 1.1  PROT 7.6 6.8 7.4  ALBUMIN 3.5 3.3* 3.3*   Recent Labs  Lab 09/02/23 2013 09/03/23 1811  LIPASE 2,665* 582*   Recent Labs  Lab 09/03/23 0704  AMMONIA 61*   ABG    Component Value Date/Time   HCO3 7.2 (L) 09/02/2023 1006   TCO2 8 (L) 09/02/2023 1006   ACIDBASEDEF 22.0 (H) 09/02/2023 1006   O2SAT 95 09/02/2023 1006     Coagulation Profile: Recent Labs  Lab 09/02/23 0909 09/02/23 1025 09/03/23 0704  INR 1.3* 1.3* 1.1    Cardiac Enzymes: Recent Labs  Lab 09/02/23 0950  CKTOTAL 277    HbA1C: Hgb A1c MFr Bld  Date/Time Value Ref Range Status  09/02/2023 01:03 PM 13.5 (H) 4.8 - 5.6 % Final    Comment:    (NOTE)         Prediabetes: 5.7 - 6.4         Diabetes: >6.4         Glycemic control for adults with diabetes: <7.0    CBG: Recent Labs  Lab 09/06/23 0125 09/06/23 0238 09/06/23 0347 09/06/23 0521 09/06/23 0748  GLUCAP 161* 136* 139* 176* 257*    Review of Systems:   Negative. Denies pain or discomfort. Shares that he is thirsty and would like coffee with breakfast.  Past Medical History:  He,  has no past medical history on file.   Surgical History:   Social History:     Family History:  His  family history is not on file.   Allergies Allergies  Allergen Reactions   Novocain [Procaine] Other (See Comments)    Made patients mouth swell during getting this for a dental procedure and was taken to the ED.     Home Medications  Prior to Admission medications   Medication Sig Start Date End Date Taking? Authorizing Provider  fenofibrate (TRICOR) 48 MG tablet Take 48 mg by mouth daily. 08/11/23  Yes [provider]  glipiZIDE (GLUCOTROL) 5 MG tablet Take 5 mg by mouth daily. 08/11/23  Yes [provider]  IBUPROFEN PO Take 2-3 tablets by mouth as needed for mild pain (pain score 1-3) or moderate pain (pain score 4-6).   Yes [provider]  losartan (COZAAR) 50 MG tablet Take 50 mg by mouth daily. 08/02/23  Yes [provider]  metFORMIN (GLUCOPHAGE) 500 MG tablet Take 500 mg by mouth 2 (two) times daily. 08/02/23  Yes [provider]  pravastatin (PRAVACHOL) 40 MG tablet Take 40 mg by mouth daily. 08/11/23  Yes [provider]  Vitamin D, Ergocalciferol, (DRISDOL) 1.25 MG (50000 UNIT) CAPS capsule Take 50,000 Units by mouth once a week. No set day to take it. 08/02/23  Yes [provider]  SYMBICORT 160-4.5 MCG/ACT inhaler Inhale 2 puffs into the lungs. Patient not taking: Reported on 09/02/2023  08/31/23   [provider]     Critical care time: 35 mins    Lonni Africa, DO IM Resident PGY-2

## 2023-09-06 NOTE — Progress Notes (Addendum)
 eLink Physician-Brief Progress Note Patient Name: Jack Cunningham DOB: 1960/12/26 MRN: [id number]   Date of Service  09/06/2023  HPI/Events of Note  Hand off to follow BMP, s/p DKA. Last glucose before shift was > 300.  Last BMP is from AM  eICU Interventions  BMP ordered, call back if abnormal.     Intervention Category Intermediate Interventions: Hyperglycemia - evaluation and treatment  [name] 09/06/2023, 9:01 PM  21:21 c/o urine urgency and burning. had a foley cath but removed today. Keeping his urinal in place at all times. nurse says urine pink/amber. had a traumatic foley placement  hat left him bloody  - discussed with RN. Already on antibiotics for UTI. - lidocaine  gel local ordered.

## 2023-09-06 NOTE — TOC CM/SW Note (Signed)
 Transition of Care [facility]) - Inpatient Brief Assessment   Patient Details  Name: Jack Cunningham MRN: [id number] Date of Birth: Jul 16, 1960  Transition of Care [facility]) CM/SW Contact:    Lauraine FORBES Saa, LCSW Phone Number: 09/06/2023, 9:59 AM   Clinical Narrative:  9:59 AM Per chart review, patient resides at home with spouse. Patient has insurance but does not have a PCP. Patient does not have SNF/HH/DME history. Patient's preferred pharmacy is [facility] [phone] [facility] TOC will continue to follow and be available to assist.  Transition of Care Asessment: Insurance and Status: Insurance coverage has been reviewed Patient has primary care physician: No Home environment has been reviewed: Private Residence Prior level of function:: N/A Prior/Current Home Services: No current home services Social Drivers of Health Review: SDOH reviewed no interventions necessary Readmission risk has been reviewed: Yes Transition of care needs: transition of care needs identified, TOC will continue to follow

## 2023-09-06 NOTE — Progress Notes (Signed)
 eLink Physician-Brief Progress Note Patient Name: Jack Cunningham DOB: 1960-08-13 MRN: [id number]   Date of Service  09/06/2023  HPI/Events of Note  Retrospective entry - RN had reached out earlier to place diet orders since patient was being taken off IV insulin   eICU Interventions  Diet ordered and fluids to stop when insulin  IV is stopped as well.      Intervention Category Major Interventions: Hyperglycemia - active titration of insulin  therapy  Reniah Cottingham G Kriste Broman 09/06/2023, 6:26 AM

## 2023-09-07 ENCOUNTER — Encounter (HOSPITAL_COMMUNITY): Payer: Self-pay | Admitting: Internal Medicine

## 2023-09-07 ENCOUNTER — Other Ambulatory Visit (HOSPITAL_COMMUNITY): Payer: Self-pay

## 2023-09-07 DIAGNOSIS — E1310 Other specified diabetes mellitus with ketoacidosis without coma: Secondary | ICD-10-CM

## 2023-09-07 LAB — CULTURE, BLOOD (ROUTINE X 2)
Culture: NO GROWTH
Culture: NO GROWTH
Special Requests: ADEQUATE

## 2023-09-07 LAB — BASIC METABOLIC PANEL WITH GFR
Anion gap: 8 (ref 5–15)
Anion gap: 12 (ref 5–15)
Anion gap: 13 (ref 5–15)
Anion gap: 11 (ref 5–15)
Anion gap: 9 (ref 5–15)
BUN: 78 mg/dL — ABNORMAL HIGH (ref 8–23)
BUN: 78 mg/dL — ABNORMAL HIGH (ref 8–23)
BUN: 88 mg/dL — ABNORMAL HIGH (ref 8–23)
BUN: 84 mg/dL — ABNORMAL HIGH (ref 8–23)
BUN: 73 mg/dL — ABNORMAL HIGH (ref 8–23)
CO2: 27 mmol/L (ref 22–32)
CO2: 25 mmol/L (ref 22–32)
CO2: 22 mmol/L (ref 22–32)
CO2: 25 mmol/L (ref 22–32)
CO2: 27 mmol/L (ref 22–32)
Calcium: 8.3 mg/dL — ABNORMAL LOW (ref 8.9–10.3)
Calcium: 8.5 mg/dL — ABNORMAL LOW (ref 8.9–10.3)
Calcium: 8.4 mg/dL — ABNORMAL LOW (ref 8.9–10.3)
Calcium: 8.6 mg/dL — ABNORMAL LOW (ref 8.9–10.3)
Calcium: 8.7 mg/dL — ABNORMAL LOW (ref 8.9–10.3)
Chloride: 115 mmol/L — ABNORMAL HIGH (ref 98–111)
Chloride: 113 mmol/L — ABNORMAL HIGH (ref 98–111)
Chloride: 116 mmol/L — ABNORMAL HIGH (ref 98–111)
Chloride: 117 mmol/L — ABNORMAL HIGH (ref 98–111)
Chloride: 120 mmol/L — ABNORMAL HIGH (ref 98–111)
Creatinine, Ser: 2.78 mg/dL — ABNORMAL HIGH (ref 0.61–1.24)
Creatinine, Ser: 2.69 mg/dL — ABNORMAL HIGH (ref 0.61–1.24)
Creatinine, Ser: 2.55 mg/dL — ABNORMAL HIGH (ref 0.61–1.24)
Creatinine, Ser: 2.44 mg/dL — ABNORMAL HIGH (ref 0.61–1.24)
Creatinine, Ser: 2.37 mg/dL — ABNORMAL HIGH (ref 0.61–1.24)
GFR, Estimated: 25 mL/min — ABNORMAL LOW (ref >60)
GFR, Estimated: 26 mL/min — ABNORMAL LOW (ref >60)
GFR, Estimated: 28 mL/min — ABNORMAL LOW (ref >60)
GFR, Estimated: 29 mL/min — ABNORMAL LOW (ref >60)
GFR, Estimated: 30 mL/min — ABNORMAL LOW (ref >60)
Glucose, Bld: 404 mg/dL — ABNORMAL HIGH (ref 70–99)
Glucose, Bld: 374 mg/dL — ABNORMAL HIGH (ref 70–99)
Glucose, Bld: 375 mg/dL — ABNORMAL HIGH (ref 70–99)
Glucose, Bld: 263 mg/dL — ABNORMAL HIGH (ref 70–99)
Glucose, Bld: 186 mg/dL — ABNORMAL HIGH (ref 70–99)
Potassium: 4.5 mmol/L (ref 3.5–5.1)
Potassium: 3.8 mmol/L (ref 3.5–5.1)
Potassium: 3.7 mmol/L (ref 3.5–5.1)
Potassium: 3.9 mmol/L (ref 3.5–5.1)
Potassium: 4 mmol/L (ref 3.5–5.1)
Sodium: 150 mmol/L — ABNORMAL HIGH (ref 135–145)
Sodium: 150 mmol/L — ABNORMAL HIGH (ref 135–145)
Sodium: 151 mmol/L — ABNORMAL HIGH (ref 135–145)
Sodium: 153 mmol/L — ABNORMAL HIGH (ref 135–145)
Sodium: 156 mmol/L — ABNORMAL HIGH (ref 135–145)

## 2023-09-07 LAB — GLUCOSE, CAPILLARY
Glucose-Capillary: 347 mg/dL — ABNORMAL HIGH (ref 70–99)
Glucose-Capillary: 345 mg/dL — ABNORMAL HIGH (ref 70–99)
Glucose-Capillary: 391 mg/dL — ABNORMAL HIGH (ref 70–99)
Glucose-Capillary: 379 mg/dL — ABNORMAL HIGH (ref 70–99)
Glucose-Capillary: 337 mg/dL — ABNORMAL HIGH (ref 70–99)
Glucose-Capillary: 318 mg/dL — ABNORMAL HIGH (ref 70–99)
Glucose-Capillary: 335 mg/dL — ABNORMAL HIGH (ref 70–99)
Glucose-Capillary: 330 mg/dL — ABNORMAL HIGH (ref 70–99)
Glucose-Capillary: 274 mg/dL — ABNORMAL HIGH (ref 70–99)
Glucose-Capillary: 172 mg/dL — ABNORMAL HIGH (ref 70–99)

## 2023-09-07 LAB — CBC
HCT: 33.4 % — ABNORMAL LOW (ref 39.0–52.0)
Hemoglobin: 10.2 g/dL — ABNORMAL LOW (ref 13.0–17.0)
MCH: 29 pg (ref 26.0–34.0)
MCHC: 30.5 g/dL (ref 30.0–36.0)
MCV: 94.9 fL (ref 80.0–100.0)
Platelets: 138 K/uL — ABNORMAL LOW (ref 150–400)
RBC: 3.52 MIL/uL — ABNORMAL LOW (ref 4.22–5.81)
RDW: 12.5 % (ref 11.5–15.5)
WBC: 22.4 K/uL — ABNORMAL HIGH (ref 4.0–10.5)
nRBC: 0 % (ref 0.0–0.2)

## 2023-09-07 MED ORDER — DEXTROSE 50 % IV SOLN: 0.0000 mL | INTRAVENOUS | Status: DC | PRN

## 2023-09-07 MED ORDER — INSULIN REGULAR(HUMAN) IN NACL 100-0.9 UT/100ML-% IV SOLN
INTRAVENOUS | Status: DC
  Administered 2023-09-07: 8 [IU]/h via INTRAVENOUS
  Filled 2023-09-07: qty 100

## 2023-09-07 MED ORDER — IPRATROPIUM-ALBUTEROL 0.5-2.5 (3) MG/3ML IN SOLN
3.0000 mL | RESPIRATORY_TRACT | Status: DC | PRN
  Administered 2023-09-13: 3 mL via RESPIRATORY_TRACT
  Filled 2023-09-07: qty 3

## 2023-09-07 MED ORDER — METOPROLOL TARTRATE 5 MG/5ML IV SOLN: 5.0000 mg | INTRAVENOUS | Status: DC | PRN

## 2023-09-07 MED ORDER — INSULIN ASPART 100 UNIT/ML IJ SOLN
0.0000 [IU] | Freq: Three times a day (TID) | INTRAMUSCULAR | Status: DC
  Administered 2023-09-07: 11 [IU] via SUBCUTANEOUS
  Administered 2023-09-07 – 2023-09-08 (×2): 8 [IU] via SUBCUTANEOUS

## 2023-09-07 MED ORDER — LINEZOLID 600 MG PO TABS
600.0000 mg | ORAL_TABLET | Freq: Two times a day (BID) | ORAL | Status: AC
  Administered 2023-09-07 – 2023-09-10 (×8): 600 mg via ORAL
  Filled 2023-09-07 (×10): qty 1

## 2023-09-07 MED ORDER — INSULIN ASPART 100 UNIT/ML IJ SOLN
4.0000 [IU] | Freq: Three times a day (TID) | INTRAMUSCULAR | Status: DC
  Administered 2023-09-07 (×2): 4 [IU] via SUBCUTANEOUS

## 2023-09-07 MED ORDER — INSULIN ASPART 100 UNIT/ML IJ SOLN
0.0000 [IU] | Freq: Every day | INTRAMUSCULAR | Status: DC
  Administered 2023-09-08: 3 [IU] via SUBCUTANEOUS

## 2023-09-07 MED ORDER — INSULIN GLARGINE-YFGN 100 UNIT/ML ~~LOC~~ SOLN
50.0000 [IU] | Freq: Every day | SUBCUTANEOUS | Status: DC
  Filled 2023-09-07: qty 0.5

## 2023-09-07 MED ORDER — INSULIN GLARGINE-YFGN 100 UNIT/ML ~~LOC~~ SOLN
60.0000 [IU] | Freq: Two times a day (BID) | SUBCUTANEOUS | Status: DC
  Administered 2023-09-07 – 2023-09-08 (×4): 60 [IU] via SUBCUTANEOUS
  Filled 2023-09-07 (×7): qty 0.6

## 2023-09-07 MED ORDER — SODIUM CHLORIDE 0.45 % IV SOLN: INTRAVENOUS | Status: DC

## 2023-09-07 MED ORDER — PANTOPRAZOLE SODIUM 40 MG PO TBEC
40.0000 mg | DELAYED_RELEASE_TABLET | Freq: Every day | ORAL | Status: DC
  Administered 2023-09-07 – 2023-09-16 (×10): 40 mg via ORAL
  Filled 2023-09-07 (×10): qty 1

## 2023-09-07 MED ORDER — GLUCAGON HCL RDNA (DIAGNOSTIC) 1 MG IJ SOLR
1.0000 mg | INTRAMUSCULAR | Status: AC | PRN
Start: 2023-09-07 — End: 2023-09-10
  Administered 2023-09-10: 1 mg via INTRAVENOUS
  Filled 2023-09-07: qty 1

## 2023-09-07 NOTE — Plan of Care (Signed)

## 2023-09-07 NOTE — Progress Notes (Signed)
 This patient is receiving the  Linezolid  by the intravenous route. Based on criteria approved by the Pharmacy and Therapeutics  Committee,  Linezolid  is being converted to equivalent oral dose form(s).  These criteria include:  A. Has a documented ability to take oral medications (tolerating oral or gastric tube feedings for  >24 hours OR taking other scheduled oral medications for >24 hours).   B. Expected plan for continued treatment for at least 1 day.   C. Has documented clinical improvement; the patient must demonstrate:  1. 24-hour maximum temperature of <100.5 F  2. MD/patient assessment of improvement  If you have questions about this conversion, please contact the pharmacy department.  Thank you.  Rankin Sams, PharmD, BCPS, BCCCP Clinical Pharmacist

## 2023-09-07 NOTE — Inpatient Diabetes Management (Addendum)
 Inpatient Diabetes Program Recommendations  AACE/ADA: New Consensus Statement on Inpatient Glycemic Control (2015)  Target Ranges:  Prepandial:   less than 140 mg/dL      Peak postprandial:   less than 180 mg/dL (1-2 hours)      Critically ill patients:  140 - 180 mg/dL    Latest Reference Range & Units 09/02/23 13:03  Hemoglobin A1C 4.8 - 5.6 % 13.5 (H)  341 mg/dl  (H): Data is abnormally high  Latest Reference Range & Units 09/06/23 05:21 09/06/23 07:48 09/06/23 12:01 09/06/23 17:20 09/06/23 19:42 09/06/23 21:35  Glucose-Capillary 70 - 99 mg/dL  38 units Semglee  @0100  176 (H) 257 (H)  11 units Novolog   385 (H)  30 units Novolog   328 (H)  25 units Novolog   333 (H) 293 (H)  3 units Novolog   38 units Semglee   (H): Data is abnormally high  Latest Reference Range & Units 09/06/23 23:48 09/07/23 04:00 09/07/23 06:24 09/07/23 07:30  Glucose-Capillary 70 - 99 mg/dL 665 (H) 652 (H) 654 (H)  IV Insulin  Drip Restarted 391 (H)  (H): Data is abnormally high   Admit with:  DKA  Sepsis due to RLL Pneumonia and pancreatitis Hypernatremia Hypokalemia    History: Recent Diagnosis Diabetes   Home DM Meds: Glipizide 5 mg daily                              Metformin 500 mg BID   Current Orders: IV Insulin  Drip     Note IV Insulin  Drip restarted this AM for severe Hyperglycemia  4am BMET showed Anion Gap 8/ CO2 level 27  Pt appears to have insulin  resistance and will likely require large doses of SQ Insulin  once off the IV Insulin  Drip    Addendum 1pm--Met w/ pt and wife again today.  Spoke with pt about new diagnosis.  Discussed A1C results with them and explained what an A1C is, basic pathophysiology of DM Type 2, basic home care, basic diabetes diet nutrition principles, importance of checking CBGs and maintaining good CBG control to prevent long-term and short-term complications.  Reviewed signs and symptoms of hyperglycemia and hypoglycemia and how to treat hypoglycemia  at home.  Also reviewed blood sugar goals and A1c goals for home.    Have ordered educational booklet, insulin  starter kit.  Have also placed RD consult for DM diet education for this patient.  Discussed with pt and wife checking CBGs at home.  Pt will need glucometer for home.  Pt and wife also very interested in [place] 3 glucose sensors.  Asked OP pharmacy to check coverage for pt--PA completed and pt qualifies for FSL3 plus sensors at $0 co-pay.  Lantus  and Novolog  insulins also covered at $4 co-pay.  I attempted to help pt download the Freestyle libre app to his phone but his phone was not compatible--Will need FSL3 reader at time of d/c.  Explained to pt and wife that pt needs to complete fingerstick CBGs at home when symptoms don't match CGM reading--pt and wife stated understanding.    Educated patient and spouse on insulin  pen use at home.  Reviewed contents of insulin  flexpen starter kit.  Reviewed all steps of insulin  pen including attachment of needle, 2-unit air shot, dialing up dose, giving injection, rotation of injection sites, removing needle, disposal of sharps, storage of unused insulin , disposal of insulin  etc.  Patient's wife able to provide successful return demonstration.  Reviewed troubleshooting with  insulin  pen.       --Will follow patient during hospitalization--  Adina Rudolpho Arrow RN, MSN, CDCES Diabetes Coordinator Inpatient Glycemic Control Team Team Pager: [phone] (8a-5p)

## 2023-09-07 NOTE — Progress Notes (Signed)
 eLink Physician-Brief Progress Note Patient Name: Jack Cunningham DOB: [birth year] MRN: [id number]   Date of Service  09/07/2023  HPI/Events of Note  Severe hyperglycemia with glucose over 400. RN says patient only ate a small part of his meal and has been given all of his insulin  doses as ordered. K is ok.   eICU Interventions  Start IV insulin  per protocol I discontinued subcutaneous orders for now Keeping NPO for now I also ordered Q4 BMP from 10 am  Dw RN      Intervention Category Major Interventions: Hyperglycemia - active titration of insulin  therapy  Ruperto Kiernan G Rafaela Dinius 09/07/2023, 6:14 AM

## 2023-09-07 NOTE — Telephone Encounter (Signed)
 Pharmacy Patient Advocate Encounter  Received notification from [facility] that Prior Authorization for FreeStyle Libre 3 Plus Sensor  has been APPROVED from 09/07/2023 to 03/09/2024. Ran test claim, Copay is $0.00. This test claim was processed through [facility] copay amounts may vary at other pharmacies due to pharmacy/plan contracts, or as the patient moves through the different stages of their insurance plan.   PA #/Case ID/Reference #: [id number]
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
## 2023-09-07 NOTE — Progress Notes (Signed)
   09/07/23 2100  BiPAP/CPAP/SIPAP  $ Face Mask Medium Yes  BiPAP/CPAP/SIPAP Pt Type Adult  BiPAP/CPAP/SIPAP SERVO  Mask Type Full face mask  Dentures removed? Not applicable  Mask Size Medium  IPAP 5 cmH20  EPAP 5 cmH2O  PEEP 5 cmH20  Minute Ventilation 17.6  Leak 32  Peak Inspiratory Pressure (PIP) 18  Patient Home Machine No  Patient Home Mask No  Patient Home Tubing No  Auto Titrate No  Press High Alarm 40 cmH2O  Press Low Alarm 5 cmH2O  CPAP/SIPAP surface wiped down Yes  Device Plugged into RED Power Outlet Yes

## 2023-09-08 DIAGNOSIS — E1010 Type 1 diabetes mellitus with ketoacidosis without coma: Secondary | ICD-10-CM

## 2023-09-08 DIAGNOSIS — W19XXXA Unspecified fall, initial encounter: Secondary | ICD-10-CM

## 2023-09-08 DIAGNOSIS — E870 Hyperosmolality and hypernatremia: Secondary | ICD-10-CM | POA: Diagnosis not present

## 2023-09-08 LAB — BASIC METABOLIC PANEL WITH GFR
Anion gap: 12 (ref 5–15)
BUN: 68 mg/dL — ABNORMAL HIGH (ref 8–23)
CO2: 25 mmol/L (ref 22–32)
Calcium: 8.5 mg/dL — ABNORMAL LOW (ref 8.9–10.3)
Chloride: 123 mmol/L — ABNORMAL HIGH (ref 98–111)
Creatinine, Ser: 2.33 mg/dL — ABNORMAL HIGH (ref 0.61–1.24)
GFR, Estimated: 31 mL/min — ABNORMAL LOW (ref >60)
Glucose, Bld: 135 mg/dL — ABNORMAL HIGH (ref 70–99)
Potassium: 4 mmol/L (ref 3.5–5.1)
Sodium: 160 mmol/L — ABNORMAL HIGH (ref 135–145)

## 2023-09-08 LAB — CBC
HCT: 31.5 % — ABNORMAL LOW (ref 39.0–52.0)
Hemoglobin: 9.5 g/dL — ABNORMAL LOW (ref 13.0–17.0)
MCH: 29 pg (ref 26.0–34.0)
MCHC: 30.2 g/dL (ref 30.0–36.0)
MCV: 96 fL (ref 80.0–100.0)
Platelets: 150 K/uL (ref 150–400)
RBC: 3.28 MIL/uL — ABNORMAL LOW (ref 4.22–5.81)
RDW: 12.6 % (ref 11.5–15.5)
WBC: 18.1 K/uL — ABNORMAL HIGH (ref 4.0–10.5)
nRBC: 0 % (ref 0.0–0.2)

## 2023-09-08 LAB — PHOSPHORUS: Phosphorus: 3.5 mg/dL (ref 2.5–4.6)

## 2023-09-08 LAB — GLUCOSE, CAPILLARY
Glucose-Capillary: 102 mg/dL — ABNORMAL HIGH (ref 70–99)
Glucose-Capillary: 29 mg/dL — CL (ref 70–99)
Glucose-Capillary: 75 mg/dL (ref 70–99)
Glucose-Capillary: <10 mg/dL — CL (ref 70–99)
Glucose-Capillary: 268 mg/dL — ABNORMAL HIGH (ref 70–99)

## 2023-09-08 LAB — MAGNESIUM: Magnesium: 3.2 mg/dL — ABNORMAL HIGH (ref 1.7–2.4)

## 2023-09-08 MED ORDER — DEXTROSE 50 % IV SOLN
INTRAVENOUS | Status: AC
  Filled 2023-09-08: qty 50

## 2023-09-08 MED ORDER — SODIUM CHLORIDE 0.45 % IV SOLN: INTRAVENOUS | Status: AC

## 2023-09-08 MED ORDER — DEXTROSE 50 % IV SOLN
25.0000 g | INTRAVENOUS | Status: AC
  Administered 2023-09-08: 25 g via INTRAVENOUS

## 2023-09-08 NOTE — TOC Progression Note (Signed)
 Transition of Care [facility]) - Progression Note    Patient Details  Name: Jack Cunningham MRN: [id number] Date of Birth: 1960-09-15  [facility] CM/SW Contact  Nola Devere Hands, RN Phone Number: 09/08/2023, 12:53 PM  Clinical Narrative:    Case manager has contacted several home health agencies but none are accepting Medicaid coverage at this time. Patient has been referred for outpatient therapy. PCP appointment will be arranged for patient by department CMA.     Expected Discharge Plan: Home/Self Care Barriers to Discharge: No Barriers Identified  Expected Discharge Plan and Services     Post Acute Care Choice: Durable Medical Equipment, Home Health                   DME Arranged: Walker rolling DME Agency: [employer] Date DME Agency Contacted: 09/08/23 Time DME Agency Contacted: 1226 Representative spoke with at DME Agency: Thomasina             Social Determinants of Health (SDOH) Interventions SDOH Screenings   Food Insecurity: No Food Insecurity (09/03/2023)  Housing: Low Risk  (09/03/2023)  Transportation Needs: No Transportation Needs (09/03/2023)  Utilities: Not At Risk (09/03/2023)    Readmission Risk Interventions     No data to display

## 2023-09-08 NOTE — Progress Notes (Signed)
 Physical Therapy Treatment Patient Details Name: Jack Cunningham MRN: [id number] DOB: September 22, 1960 Today's Date: 09/08/2023   History of Present Illness 63 y/o male presenting to ED after being found on the floor and progressively unresponsive, GCS 3-4 with EMS. More responsive in ED,but hypothermic with glucose >1200 Admitted for DKA PMH of recent dx of DM, HTN, asthma, OSA    PT Comments  Pt tolerated treatment well today. Initially started with RW however pt was picking up RW off floor so opted for no AD with CGA. Pt occasionally drifted L/R. No change in DC/DME recs at this time. PT will continue to follow.     If plan is discharge home, recommend the following: A little help with walking and/or transfers;A little help with bathing/dressing/bathroom;Assistance with cooking/housework;Direct supervision/assist for medications management;Direct supervision/assist for financial management;Assist for transportation;Help with stairs or ramp for entrance   Can travel by private vehicle        Equipment Recommendations  Rolling walker (2 wheels)    Recommendations for Other Services       Precautions / Restrictions Precautions Precautions: Fall Recall of Precautions/Restrictions: Intact Restrictions Weight Bearing Restrictions Per Provider Order: No     Mobility  Bed Mobility               General bed mobility comments: Received in recliner    Transfers Overall transfer level: Needs assistance Equipment used: None Transfers: Sit to/from Stand Sit to Stand: Contact guard assist           General transfer comment: CGA for safety as chair was not locked upon standing.    Ambulation/Gait Ambulation/Gait assistance: Contact guard assist Gait Distance (Feet): 150 Feet Assistive device: None Gait Pattern/deviations: Step-through pattern, Decreased stride length, Drifts right/left Gait velocity: slowed     General Gait Details: Initially started with RW however pt was  picking up RW off floor so opted for no AD. Pt occasionally drifted L/R.   Stairs             Wheelchair Mobility     Tilt Bed    Modified Rankin (Stroke Patients Only)       Balance Overall balance assessment: Needs assistance Sitting-balance support: Feet supported, No upper extremity supported Sitting balance-Leahy Scale: Good     Standing balance support: During functional activity, Reliant on assistive device for balance, Bilateral upper extremity supported Standing balance-Leahy Scale: Fair                              Hotel manager: No apparent difficulties  Cognition Arousal: Alert Behavior During Therapy: WFL for tasks assessed/performed, Flat affect   PT - Cognitive impairments: Safety/Judgement, Problem solving, Sequencing                       PT - Cognition Comments: Pt with slowed response Following commands: Impaired Following commands impaired: Follows multi-step commands with increased time, Follows one step commands with increased time    Cueing Cueing Techniques: Verbal cues, Gestural cues, Tactile cues, Visual cues  Exercises      General Comments General comments (skin integrity, edema, etc.): VSS      Pertinent Vitals/Pain Pain Assessment Pain Assessment: No/denies pain    Home Living                          Prior Function  PT Goals (current goals can now be found in the care plan section) Progress towards PT goals: Progressing toward goals    Frequency    Min 3X/week      PT Plan      Co-evaluation              AM-PAC PT 6 Clicks Mobility   Outcome Measure  Help needed turning from your back to your side while in a flat bed without using bedrails?: None Help needed moving from lying on your back to sitting on the side of a flat bed without using bedrails?: A Little Help needed moving to and from a bed to a chair (including a  wheelchair)?: A Little Help needed standing up from a chair using your arms (e.g., wheelchair or bedside chair)?: A Little Help needed to walk in hospital room?: A Little Help needed climbing 3-5 steps with a railing? : A Lot 6 Click Score: 18    End of Session Equipment Utilized During Treatment: Gait belt Activity Tolerance: Patient tolerated treatment well Patient left: in chair;with call bell/phone within reach;with chair alarm set Nurse Communication: Mobility status PT Visit Diagnosis: Other abnormalities of gait and mobility (R26.89);Other symptoms and signs involving the nervous system (R29.898)     Time: 8556-8546 PT Time Calculation (min) (ACUTE ONLY): 10 min  Charges:    $Gait Training: 8-22 mins PT General Charges $$ ACUTE PT VISIT: 1 Visit                     Sueellen NOVAK, PT, DPT Acute Rehab Services [phone]    Jack Cunningham 09/08/2023, 4:07 PM

## 2023-09-08 NOTE — Evaluation (Signed)
 Clinical/Bedside Swallow Evaluation Patient Details  Name: Jack Cunningham MRN: [id number] Date of Birth: November 19, 1960  Today's Date: 09/08/2023 Time: SLP Start Time (ACUTE ONLY): 1200 SLP Stop Time (ACUTE ONLY): 1220 SLP Time Calculation (min) (ACUTE ONLY): 20 min  Past Medical History: History reviewed. No pertinent past medical history.  HPI:  Patient is a 63 y.o.  male with history of DM-2 (recent diagnosis), HTN, asthma/OSA-who was brought to the hospital by EMS-after they were called by his spouse-when she heard a loud sound-and found the patient on the floor asking for help.  Apparently patient was recently diagnosed with DM-following several weeks of not feeling well and polydipsia.  In the ED-he was lethargic-upon further evaluation-he was found to have DKA, AKI and sepsis secondary to pneumonia/pancreatitis.  He was subsequently admitted to the ICU-stabilized and transferred to [facility] on 7/8.    Assessment / Plan / Recommendation  Clinical Impression  Pt verbalizes some difficulty eating, but not necessarily swallowing. He reports feeling disgusted by food, chewing but not wanting to swallow it. He denies coughing, choking a feeling of food sticking in his throat, regurgitating or any traditional signs of dysphagia. Pt observed to tolerate water and ice cream well. He also ate some potato salad without difficulty. But when trying the meat on his tray he just didnt find it appetizing in his mouth. Suspect this pt is still recovering his appetite after DKA, which can be a process. Likely some mild texture aversion. Advised pt to focus on nutritious foods, but also dont stress if he isnt interested in eating a specific food yet. He had some very dry mouth in ICU and is still recovering from that. No need to modify textures. No specific acute interventions at this time. If food aversion were to persist, an OP SLP referral could be beneficial for longer term treatment. SLP Visit Diagnosis: Dysphagia,  unspecified (R13.10)    Aspiration Risk  Risk for inadequate nutrition/hydration    Diet Recommendation Regular;Thin liquid    Liquid Administration via: Straw;Cup Medication Administration: Whole meds with puree Supervision: Patient able to self feed Compensations: Slow rate;Small sips/bites    Other  Recommendations       Assistance Recommended at Discharge    Functional Status Assessment    Frequency and Duration            Prognosis        Swallow Study   General HPI: Patient is a 63 y.o.  male with history of DM-2 (recent diagnosis), HTN, asthma/OSA-who was brought to the hospital by EMS-after they were called by his spouse-when she heard a loud sound-and found the patient on the floor asking for help.  Apparently patient was recently diagnosed with DM-following several weeks of not feeling well and polydipsia.  In the ED-he was lethargic-upon further evaluation-he was found to have DKA, AKI and sepsis secondary to pneumonia/pancreatitis.  He was subsequently admitted to the ICU-stabilized and transferred to [facility] on 7/8. Type of Study: Bedside Swallow Evaluation Previous Swallow Assessment: none Diet Prior to this Study: Thin liquids (Level 0);Regular Temperature Spikes Noted: No Respiratory Status: Room air History of Recent Intubation: No Behavior/Cognition: Alert;Cooperative;Pleasant mood Oral Cavity Assessment: Within Functional Limits Oral Care Completed by SLP: No Oral Cavity - Dentition: Adequate natural dentition Vision: Functional for self-feeding Self-Feeding Abilities: Able to feed self Patient Positioning: Upright in chair Baseline Vocal Quality: Normal Volitional Cough: Strong Volitional Swallow: Able to elicit    Oral/Motor/Sensory Function Overall Oral Motor/Sensory Function: Within functional limits  Ice Chips     Thin Liquid Thin Liquid: Within functional limits    Nectar Thick Nectar Thick Liquid: Not tested   Honey Thick Honey Thick Liquid:  Not tested   Puree Puree: Within functional limits   Solid     Solid: Impaired Presentation: Self Fed Oral Phase Functional Implications: Prolonged oral transit      Jack Cunningham, Consuelo Fitch 09/08/2023,1:24 PM

## 2023-09-08 NOTE — Progress Notes (Signed)
 PT was placed on CPAP around 9:05 PM. RT was called to take the CPAP off due to the pt not wanting to wear it around 9:37 PM.  Pt was told if he felt the BIPAP was needed, RT would come back.

## 2023-09-08 NOTE — Progress Notes (Signed)
 Hypoglycemic Event  CBG: 29  Treatment: D50 50 mL (25 gm)  Symptoms: None  Follow-up CBG: Time:1249 CBG Result:75  Possible Reasons for Event: Inadequate meal intake and Unknown      Jack Cunningham [place]

## 2023-09-08 NOTE — Progress Notes (Signed)
 Nutrition Education Note  RD consulted for nutrition education regarding diabetes.   Lab Results  Component Value Date   HGBA1C 13.5 (H) 09/02/2023    RD provided Carbohydrate Counting for People with Diabetes handout from the [employer]. Discussed different food groups and their effects on blood sugar, emphasizing carbohydrate-containing foods. Provided list of carbohydrates and recommended serving sizes of common foods.  Spoke with patient and wife at bedside. Wife reports his diet is high in sugary sweetened beverages and snacks. Discussed implications of this type of diet and how it contributed to his current state.   Discussed importance of controlled and consistent carbohydrate intake throughout the day. Provided examples of ways to balance meals/snacks and encouraged intake of high-fiber, whole grain complex carbohydrates. Teach back method used.  Expect adequate compliance.  Body mass index is 29.17 kg/m. Pt meets criteria for overweight for age, based on current BMI.  Current diet order is carb modified, patient is consuming approximately 80-90% of meals at this time. Labs and medications reviewed. No further nutrition interventions warranted at this time. RD contact information provided. If additional nutrition issues arise, please re-consult RD.  Blair Deaner MS, RD, LDN Registered Dietitian Clinical Nutrition RD Inpatient Contact Info in Amion

## 2023-09-08 NOTE — Progress Notes (Signed)
 Pt stated he does not want to wear the CPAP nor does he wear one at home. Orders taken out.

## 2023-09-08 NOTE — Progress Notes (Addendum)
 PROGRESS NOTE        PATIENT DETAILS Name: Jack Cunningham Age: 63 y.o. Sex: male Date of Birth: 08/29/1960 Admit Date: 09/02/2023 Admitting Physician Toribio JAYSON Sharps, MD ERE:[facility], No Pcp Per  Brief Summary: Patient is a 63 y.o.  male with history of DM-2 (recent diagnosis), HTN, asthma/OSA-who was brought to the hospital by EMS-after they were called by his spouse-when she heard a loud sound-and found the patient on the floor asking for help.  Apparently patient was recently diagnosed with DM-following several weeks of not feeling well and polydipsia.  In the ED-he was lethargic-upon further evaluation-he was found to have DKA, AKI and sepsis secondary to pneumonia/pancreatitis.  He was subsequently admitted to the ICU-stabilized and transferred to [facility] on 7/8.  Significant events: 7/3>> Admit to ICU-DKA 7/4>> remained on insulin  drip, hypernatremia - remained on D5W 7/5>> remained on insulin  drip and D5W due to hypernatremia, overnight spike fever, CT Chest/Abd/Pelv showing pneumonia. Started on zosyn  and linezolid . 7/6>> endotool stopped 7/8>> transferred to [facility] with  Significant studies: 7/3>> CXR: No PNA. 7/3>> x-ray pelvis: No fracture/dislocation. 7/3>> CT head: No acute intracranial abnormality 7/3>> CT C-spine: No acute intracranial abnormality. 7/4>> renal ultrasound: No hydronephrosis. 7/5>> CT chest/abdomen/pelvis: Lobar consolidation right lower lobe lung.  Significant microbiology data: 7/3>> blood culture: No growth 7/5>> blood culture: No growth  Procedures: None  Consults: PCCM.  Subjective: Lying comfortably in bed-denies any chest pain or shortness of breath.  Objective: Vitals: Blood pressure 121/70, pulse 72, temperature (!) 97.3 F (36.3 C), temperature source Oral, resp. rate 17, height 5' 10 (1.778 m), weight 92.2 kg, SpO2 100%.   Exam: Gen Exam:Alert awake-not in any distress HEENT:atraumatic, normocephalic Chest:  B/L clear to auscultation anteriorly CVS:S1S2 regular Abdomen:soft non tender, non distended Extremities:no edema Neurology: Non focal Skin: no rash  Pertinent Labs/Radiology:    Latest Ref Rng & Units 09/08/2023    4:58 AM 09/07/2023    4:42 AM 09/06/2023    9:00 AM  CBC  WBC 4.0 - 10.5 K/uL 18.1  22.4  21.2   Hemoglobin 13.0 - 17.0 g/dL 9.5  89.7  88.5   Hematocrit 39.0 - 52.0 % 31.5  33.4  36.5   Platelets 150 - 400 K/uL 150  138  110     Lab Results  Component Value Date   NA 160 (H) 09/08/2023   K 4.0 09/08/2023   CL 123 (H) 09/08/2023   CO2 25 09/08/2023      Assessment/Plan: DKA Resolved with IVF/IV insulin   DM-2 with uncontrolled hyperglycemia/DKA on presentation (A1c 13.5) CBG stable Continue Semglee  60 units twice daily+ NovoLog  insulin  4 units with meals+ SSI  Sepsis secondary to PNA (POA) Sepsis physiology has resolved Clinically improved Leukocytosis downtrending All cultures negative so far Zosyn /Zyvox  x 7 days planned Completed a course of Zithromax  for atypical coverage.  AKI 2/2 combination of hemodynamic mediated kidney injury (losartan/sepsis physiology) and osmotic diuresis due to DKA Improving with supportive care-continue gentle hydration Follow electrolytes Avoid nephrotoxic agents  Hypernatremia Secondary to free water deficit Restart IVF-0.45 NS-encourage oral intake-repeat electrolytes tomorrow.  Acute metabolic encephalopathy Suspect secondary to DKA/AKI/sepsis/PNA Thankfully has improved after treatment of underlying etiologies-awake and alert this morning. CT head was negative.  HTN BP stable without use of any antihypertensives Losartan remains on hold.  HLD Resume fenofibrate/pravastatin-in next several days.  Bronchial asthma Not in exacerbation Continue bronchodilators.  Code status:   Code Status: Full Code   DVT Prophylaxis: heparin  injection 5,000 Units Start: 09/05/23 1045 SCDs Start: 09/02/23 1132   Family  Communication: None at bedside   Disposition Plan: Status is: Inpatient Remains inpatient appropriate because: Severity of illness   Planned Discharge Destination:Home   Diet: Diet Order             Diet Carb Modified Fluid consistency: Thin; Room service appropriate? Yes  Diet effective now                     Antimicrobial agents: Anti-infectives (From admission, onward)    Start     Dose/Rate Route Frequency Ordered Stop   09/07/23 1000  linezolid  (ZYVOX ) tablet 600 mg        600 mg Oral Every 12 hours 09/07/23 0912 09/11/23 0959   09/06/23 1000  azithromycin  (ZITHROMAX ) tablet 500 mg       Placed in Or Linked Group   500 mg Oral Daily 09/05/23 1334 09/07/23 0946   09/06/23 1000  azithromycin  (ZITHROMAX ) 500 mg in sodium chloride  0.9 % 250 mL IVPB       Placed in Or Linked Group   500 mg 250 mL/hr over 60 Minutes Intravenous Daily 09/05/23 1334 09/07/23 0946   09/05/23 1000  azithromycin  (ZITHROMAX ) tablet 500 mg  Status:  Discontinued        500 mg Oral Daily 09/05/23 0741 09/05/23 1334   09/04/23 2200  piperacillin -tazobactam (ZOSYN ) IVPB 3.375 g        3.375 g 12.5 mL/hr over 240 Minutes Intravenous Every 8 hours 09/04/23 2055 09/08/23 2359   09/04/23 2200  linezolid  (ZYVOX ) IVPB 600 mg  Status:  Discontinued        600 mg 300 mL/hr over 60 Minutes Intravenous Every 12 hours 09/04/23 2055 09/07/23 0912   09/04/23 0200  piperacillin -tazobactam (ZOSYN ) IVPB 3.375 g  Status:  Discontinued        3.375 g 12.5 mL/hr over 240 Minutes Intravenous Every 12 hours 09/03/23 1630 09/04/23 0956   09/03/23 1100  cefTRIAXone  (ROCEPHIN ) 2 g in sodium chloride  0.9 % 100 mL IVPB  Status:  Discontinued        2 g 200 mL/hr over 30 Minutes Intravenous Every 24 hours 09/02/23 1246 09/02/23 1247   09/03/23 1100  cefTRIAXone  (ROCEPHIN ) 2 g in sodium chloride  0.9 % 100 mL IVPB  Status:  Discontinued        2 g 200 mL/hr over 30 Minutes Intravenous Every 24 hours 09/02/23 1248  09/02/23 1414   09/03/23 0000  cefTRIAXone  (ROCEPHIN ) 2 g in sodium chloride  0.9 % 100 mL IVPB  Status:  Discontinued        2 g 200 mL/hr over 30 Minutes Intravenous Every 24 hours 09/02/23 1247 09/02/23 1248   09/02/23 1515  piperacillin -tazobactam (ZOSYN ) IVPB 3.375 g  Status:  Discontinued        3.375 g 12.5 mL/hr over 240 Minutes Intravenous Every 8 hours 09/02/23 1426 09/03/23 1630   09/02/23 1100  cefTRIAXone  (ROCEPHIN ) 2 g in sodium chloride  0.9 % 100 mL IVPB        2 g 200 mL/hr over 30 Minutes Intravenous  Once 09/02/23 1047 09/02/23 1155   09/02/23 1100  metroNIDAZOLE  (FLAGYL ) IVPB 500 mg        500 mg 100 mL/hr over 60 Minutes Intravenous  Once 09/02/23 1047 09/02/23 1230  MEDICATIONS: Scheduled Meds:  arformoterol   15 mcg Nebulization BID   budesonide  (PULMICORT ) nebulizer solution  0.5 mg Nebulization BID   Chlorhexidine  Gluconate Cloth  6 each Topical Daily   heparin  injection (subcutaneous)  5,000 Units Subcutaneous Q8H   insulin  aspart  0-15 Units Subcutaneous TID WC   insulin  aspart  0-5 Units Subcutaneous QHS   insulin  aspart  4 Units Subcutaneous TID WC   insulin  glargine-yfgn  60 Units Subcutaneous BID   insulin  starter kit- pen needles  1 kit Other Once   linezolid   600 mg Oral Q12H   mouth rinse  15 mL Mouth Rinse 4 times per day   pantoprazole   40 mg Oral Daily   Continuous Infusions:  sodium chloride  75 mL/hr at 09/08/23 0700   piperacillin -tazobactam (ZOSYN )  IV 3.375 g (09/08/23 0542)   promethazine  (PHENERGAN ) injection (IM or IVPB)     PRN Meds:.acetaminophen  **OR** acetaminophen , antiseptic oral rinse, glucagon  (human recombinant), hydrALAZINE , ipratropium-albuterol , lip balm, metoprolol  tartrate, ondansetron  (ZOFRAN ) IV, mouth rinse, polyethylene glycol, promethazine  (PHENERGAN ) injection (IM or IVPB)   I have personally reviewed following labs and imaging studies  LABORATORY DATA: CBC: Recent Labs  Lab 09/02/23 0950 09/02/23 1006  09/05/23 0518 09/05/23 2201 09/06/23 0900 09/07/23 0442 09/08/23 0458  WBC 23.6*   < > 13.1* 19.6* 21.2* 22.4* 18.1*  NEUTROABS 20.2*  --   --   --   --   --   --   HGB 15.3   < > 14.1 12.5* 11.4* 10.2* 9.5*  HCT 54.8*   < > 44.5 41.6 36.5* 33.4* 31.5*  MCV 102.6*   < > 90.6 95.0 93.4 94.9 96.0  PLT 256   < > 102* 103* 110* 138* 150   < > = values in this interval not displayed.    Basic Metabolic Panel: Recent Labs  Lab 09/03/23 0140 09/03/23 0704 09/05/23 0331 09/05/23 0948 09/05/23 2201 09/06/23 0347 09/07/23 1057 09/07/23 1355 09/07/23 1740 09/07/23 2149 09/08/23 0458  NA 162*   < > 152*   < > 146*   < > 150* 151* 153* 156* 160*  K 4.0   < > 3.8   < > 3.8   < > 3.8 3.7 3.9 4.0 4.0  CL 121*   < > 117*   < > 111   < > 113* 116* 117* 120* 123*  CO2 27   < > 26   < > 25   < > 25 22 25 27 25   GLUCOSE 261*   < > 175*   < > 163*   < > 374* 375* 263* 186* 135*  BUN 101*   < > 81*   < > 68*   < > 78* 88* 84* 73* 68*  CREATININE 3.73*   < > 3.25*   < > 3.17*   < > 2.69* 2.55* 2.44* 2.37* 2.33*  CALCIUM  9.7   < > 8.2*   < > 7.8*   < > 8.5* 8.4* 8.6* 8.7* 8.5*  MG 4.0*  --  3.3*  --  3.1*  --   --   --   --   --  3.2*  PHOS  --   --  2.4*  --  2.9  --   --   --   --   --  3.5   < > = values in this interval not displayed.    GFR: Estimated Creatinine Clearance: 37.5 mL/min (A) (by C-G formula based on SCr  of 2.33 mg/dL (H)).  Liver Function Tests: Recent Labs  Lab 09/02/23 0909 09/02/23 1303 09/03/23 0704  AST 17 18 45*  ALT 42 39 38  ALKPHOS 65 57 52  BILITOT 2.6* 1.7* 1.1  PROT 7.6 6.8 7.4  ALBUMIN 3.5 3.3* 3.3*   Recent Labs  Lab 09/02/23 2013 09/03/23 1811  LIPASE 2,665* 582*   Recent Labs  Lab 09/03/23 0704  AMMONIA 61*    Coagulation Profile: Recent Labs  Lab 09/02/23 0909 09/02/23 1025 09/03/23 0704  INR 1.3* 1.3* 1.1    Cardiac Enzymes: Recent Labs  Lab 09/02/23 0950  CKTOTAL 277    BNP (last 3 results) No results for input(s):  PROBNP in the last 8760 hours.  Lipid Profile: No results for input(s): CHOL, HDL, LDLCALC, TRIG, CHOLHDL, LDLDIRECT in the last 72 hours.  Thyroid Function Tests: No results for input(s): TSH, T4TOTAL, FREET4, T3FREE, THYROIDAB in the last 72 hours.  Anemia Panel: No results for input(s): VITAMINB12, FOLATE, FERRITIN, TIBC, IRON, RETICCTPCT in the last 72 hours.  Urine analysis:    Component Value Date/Time   COLORURINE YELLOW 09/02/2023 0909   APPEARANCEUR HAZY (A) 09/02/2023 0909   LABSPEC 1.021 09/02/2023 0909   PHURINE 5.0 09/02/2023 0909   GLUCOSEU >=500 (A) 09/02/2023 0909   HGBUR MODERATE (A) 09/02/2023 0909   BILIRUBINUR NEGATIVE 09/02/2023 0909   KETONESUR 5 (A) 09/02/2023 0909   PROTEINUR 100 (A) 09/02/2023 0909   NITRITE NEGATIVE 09/02/2023 0909   LEUKOCYTESUR NEGATIVE 09/02/2023 0909    Sepsis Labs: Lactic Acid, Venous    Component Value Date/Time   LATICACIDVEN 2.7 (HH) 09/02/2023 1303    MICROBIOLOGY: Recent Results (from the past 240 hours)  Blood culture (routine x 2)     Status: None   Collection Time: 09/02/23  9:10 AM   Specimen: BLOOD  Result Value Ref Range Status   Specimen Description BLOOD RIGHT ANTECUBITAL  Final   Special Requests   Final    BOTTLES DRAWN AEROBIC AND ANAEROBIC Blood Culture results may not be optimal due to an inadequate volume of blood received in culture bottles   Culture   Final    NO GROWTH 5 DAYS Performed at [facility], [address] [place], [place] [address]    Report Status 09/07/2023 FINAL  Final  Blood culture (routine x 2)     Status: None   Collection Time: 09/02/23  9:50 AM   Specimen: BLOOD LEFT HAND  Result Value Ref Range Status   Specimen Description BLOOD LEFT HAND  Final   Special Requests   Final    BOTTLES DRAWN AEROBIC AND ANAEROBIC Blood Culture adequate volume   Culture   Final    NO GROWTH 5 DAYS Performed at [facility], [address] [place], [place] [address]    Report Status 09/07/2023 FINAL  Final  MRSA Next Gen by PCR, Nasal     Status: None   Collection Time: 09/02/23 11:32 AM   Specimen: Nasal Mucosa; Nasal Swab  Result Value Ref Range Status   MRSA by PCR Next Gen NOT DETECTED NOT DETECTED Final    Comment: (NOTE) The GeneXpert MRSA Assay (FDA approved for NASAL specimens only), is one component of a comprehensive MRSA colonization surveillance program. It is not intended to diagnose MRSA infection nor to guide or monitor treatment for MRSA infections. Test performance is not FDA approved in patients less than 82 years old. Performed at [facility],  [address] [place], [place] [address]   Culture, blood (Routine X 2) w Reflex to ID Panel     Status: None (Preliminary result)   Collection Time: 09/04/23 11:42 PM   Specimen: BLOOD  Result Value Ref Range Status   Specimen Description BLOOD RIGHT ARM  Final   Special Requests   Final    BOTTLES DRAWN AEROBIC AND ANAEROBIC Blood Culture results may not be optimal due to an inadequate volume of blood received in culture bottles   Culture   Final    NO GROWTH 4 DAYS Performed at [facility], [address], [place], [place] [address]    Report Status PENDING  Incomplete  Culture, blood (Routine X 2) w Reflex to ID Panel     Status: None (Preliminary result)   Collection Time: 09/04/23 11:45 PM   Specimen: BLOOD  Result Value Ref Range Status   Specimen Description BLOOD RIGHT HAND  Final   Special Requests   Final    BOTTLES DRAWN AEROBIC AND ANAEROBIC Blood Culture results may not be optimal due to an inadequate volume of blood received in culture bottles   Culture   Final    NO GROWTH 4 DAYS Performed at [facility], [address] [place], [place] [address]    Report Status PENDING  Incomplete    RADIOLOGY STUDIES/RESULTS: No results found.   LOS: 6 days   Donalda Applebaum, MD  [facility]    To contact the  attending provider between 7A-7P or the covering provider during after hours 7P-7A, please log into the web site [email or web address] and access using universal [place] password for that web site. If you do not have the password, please call the hospital operator.  09/08/2023, 9:07 AM

## 2023-09-09 ENCOUNTER — Encounter (HOSPITAL_COMMUNITY): Payer: Self-pay | Admitting: Internal Medicine

## 2023-09-09 DIAGNOSIS — W19XXXA Unspecified fall, initial encounter: Secondary | ICD-10-CM | POA: Diagnosis not present

## 2023-09-09 DIAGNOSIS — E1010 Type 1 diabetes mellitus with ketoacidosis without coma: Secondary | ICD-10-CM | POA: Diagnosis not present

## 2023-09-09 DIAGNOSIS — E870 Hyperosmolality and hypernatremia: Secondary | ICD-10-CM | POA: Diagnosis not present

## 2023-09-09 LAB — CULTURE, BLOOD (ROUTINE X 2)
Culture: NO GROWTH
Culture: NO GROWTH

## 2023-09-09 LAB — BASIC METABOLIC PANEL WITH GFR
Anion gap: 11 (ref 5–15)
Anion gap: 9 (ref 5–15)
BUN: 46 mg/dL — ABNORMAL HIGH (ref 8–23)
BUN: 35 mg/dL — ABNORMAL HIGH (ref 8–23)
CO2: 28 mmol/L (ref 22–32)
CO2: 24 mmol/L (ref 22–32)
Calcium: 8.5 mg/dL — ABNORMAL LOW (ref 8.9–10.3)
Calcium: 8.5 mg/dL — ABNORMAL LOW (ref 8.9–10.3)
Chloride: 121 mmol/L — ABNORMAL HIGH (ref 98–111)
Chloride: 117 mmol/L — ABNORMAL HIGH (ref 98–111)
Creatinine, Ser: 2 mg/dL — ABNORMAL HIGH (ref 0.61–1.24)
Creatinine, Ser: 1.72 mg/dL — ABNORMAL HIGH (ref 0.61–1.24)
GFR, Estimated: 37 mL/min — ABNORMAL LOW (ref >60)
GFR, Estimated: 44 mL/min — ABNORMAL LOW (ref >60)
Glucose, Bld: 80 mg/dL (ref 70–99)
Glucose, Bld: 366 mg/dL — ABNORMAL HIGH (ref 70–99)
Potassium: 3.8 mmol/L (ref 3.5–5.1)
Potassium: 3.9 mmol/L (ref 3.5–5.1)
Sodium: 160 mmol/L — ABNORMAL HIGH (ref 135–145)
Sodium: 150 mmol/L — ABNORMAL HIGH (ref 135–145)

## 2023-09-09 LAB — CBC
HCT: 30.1 % — ABNORMAL LOW (ref 39.0–52.0)
Hemoglobin: 9 g/dL — ABNORMAL LOW (ref 13.0–17.0)
MCH: 29.3 pg (ref 26.0–34.0)
MCHC: 29.9 g/dL — ABNORMAL LOW (ref 30.0–36.0)
MCV: 98 fL (ref 80.0–100.0)
Platelets: 180 K/uL (ref 150–400)
RBC: 3.07 MIL/uL — ABNORMAL LOW (ref 4.22–5.81)
RDW: 12.5 % (ref 11.5–15.5)
WBC: 17 K/uL — ABNORMAL HIGH (ref 4.0–10.5)
nRBC: 0 % (ref 0.0–0.2)

## 2023-09-09 LAB — GLUCOSE, CAPILLARY
Glucose-Capillary: 101 mg/dL — ABNORMAL HIGH (ref 70–99)
Glucose-Capillary: 116 mg/dL — ABNORMAL HIGH (ref 70–99)
Glucose-Capillary: 142 mg/dL — ABNORMAL HIGH (ref 70–99)
Glucose-Capillary: 183 mg/dL — ABNORMAL HIGH (ref 70–99)

## 2023-09-09 MED ORDER — DEXTROSE 50 % IV SOLN
25.0000 g | INTRAVENOUS | Status: DC | PRN
  Administered 2023-09-11 (×2): 25 g via INTRAVENOUS
  Filled 2023-09-09: qty 50

## 2023-09-09 MED ORDER — DEXTROSE 5 % IV SOLN: INTRAVENOUS | Status: DC

## 2023-09-09 MED ORDER — INSULIN GLARGINE-YFGN 100 UNIT/ML ~~LOC~~ SOLN
35.0000 [IU] | Freq: Two times a day (BID) | SUBCUTANEOUS | Status: DC
  Administered 2023-09-09 (×2): 35 [IU] via SUBCUTANEOUS
  Filled 2023-09-09 (×4): qty 0.35

## 2023-09-09 NOTE — Progress Notes (Signed)
 Occupational Therapy Treatment Patient Details Name: Jack Cunningham MRN: [id number] DOB: Feb 06, 1961 Today's Date: 09/09/2023   History of present illness 63 y/o male presenting to ED after being found on the floor and progressively unresponsive, GCS 3-4 with EMS. More responsive in ED,but hypothermic with glucose >1200 Admitted for DKA PMH of recent dx of DM, HTN, asthma, OSA   OT comments  Pt ambulated to bathroom with CGA pushing IV pole. Completed toileting and standing grooming with CGA. Pt tangential and losing his train of thought during conversation. Confusing topics on TV news show. Highly focused on lack of interest in eating and drinking. Reports he was dizzy with hallway ambulation this morning, none this visit.       If plan is discharge home, recommend the following:  Assistance with cooking/housework;Direct supervision/assist for medications management;Direct supervision/assist for financial management;Assist for transportation;Supervision due to cognitive status   Equipment Recommendations  None recommended by OT    Recommendations for Other Services      Precautions / Restrictions Precautions Precautions: Fall Restrictions Weight Bearing Restrictions Per Provider Order: No       Mobility Bed Mobility               General bed mobility comments: Received in recliner    Transfers Overall transfer level: Needs assistance Equipment used: None Transfers: Sit to/from Stand Sit to Stand: Contact guard assist           General transfer comment: pushed IV pole     Balance Overall balance assessment: Needs assistance   Sitting balance-Leahy Scale: Good       Standing balance-Leahy Scale: Fair                             ADL either performed or assessed with clinical judgement   ADL Overall ADL's : Needs assistance/impaired     Grooming: Wash/dry hands;Supervision/safety;Oral care;Standing                   Toilet Transfer:  Contact guard assist;Stand-pivot;BSC/3in1   Toileting- Clothing Manipulation and Hygiene: Contact guard assist;Sit to/from stand       Functional mobility during ADLs: Contact guard assist (pushed IV pole)      Extremity/Trunk Assessment              Vision       Perception     Praxis     Communication Communication Communication: No apparent difficulties   Cognition Arousal: Alert Behavior During Therapy: WFL for tasks assessed/performed, Flat affect Cognition: Cognition impaired       Memory impairment (select all impairments): Working memory Attention impairment (select first level of impairment): Selective attention Executive functioning impairment (select all impairments): Reasoning, Problem solving OT - Cognition Comments: pt tangential and losing his train of thought during conversation                   Following commands impaired: Only follows one step commands consistently, Follows multi-step commands inconsistently      Cueing   Cueing Techniques: Verbal cues  Exercises      Shoulder Instructions       General Comments      Pertinent Vitals/ Pain       Pain Assessment Pain Assessment: No/denies pain  Home Living Family/patient expects to be discharged to:: Private residence Living Arrangements: Spouse/significant other;Children  Prior Functioning/Environment              Frequency  Min 2X/week        Progress Toward Goals  OT Goals(current goals can now be found in the care plan section)  Progress towards OT goals: Progressing toward goals  Acute Rehab OT Goals OT Goal Formulation: With patient/family Time For Goal Achievement: 09/20/23 Potential to Achieve Goals: Good  Plan      Co-evaluation                 AM-PAC OT 6 Clicks Daily Activity     Outcome Measure   Help from another person eating meals?: None Help from another person taking care of  personal grooming?: A Little Help from another person toileting, which includes using toliet, bedpan, or urinal?: A Little Help from another person bathing (including washing, rinsing, drying)?: A Little Help from another person to put on and taking off regular upper body clothing?: None Help from another person to put on and taking off regular lower body clothing?: A Little 6 Click Score: 20    End of Session Equipment Utilized During Treatment: Gait belt  OT Visit Diagnosis: Other symptoms and signs involving cognitive function;Muscle weakness (generalized) (M62.81)   Activity Tolerance Patient tolerated treatment well   Patient Left in chair;with call bell/phone within reach;with chair alarm set   Nurse Communication          Time: 8762-8697 OT Time Calculation (min): 25 min  Charges: OT General Charges $OT Visit: 1 Visit OT Treatments $Self Care/Home Management : 23-37 mins  Jack Cunningham, Jack Cunningham Acute Rehabilitation Services Office: [phone]   Jack Cunningham 09/09/2023, 2:57 PM

## 2023-09-09 NOTE — Progress Notes (Addendum)
   09/09/23 0844  Mobility  Activity Ambulated with assistance in hallway  Level of Assistance Contact guard assist, steadying assist  Assistive Device Front wheel walker  Distance Ambulated (ft) 300 ft  Activity Response Tolerated fair  Mobility Referral Yes  Mobility visit 1 Mobility  Mobility Specialist Start Time (ACUTE ONLY) 0844  Mobility Specialist Stop Time (ACUTE ONLY) 0900  Mobility Specialist Time Calculation (min) (ACUTE ONLY) 16 min   Mobility Specialist: Progress Note  Pre-Mobility:      HR 70, SpO2 100% RA Post-Mobility:    HR  79, SpO2 100% RA, BP 144/66 (86)  Pt agreeable to mobility session - received in EOB. C/o slight dizziness and not being able to swallow his food fully, lack of appetite - RN notified. Returned to chair with all needs met - call bell within reach.   Jack Cunningham, BS Mobility Specialist Please contact via SecureChat or  Rehab office at [phone].

## 2023-09-09 NOTE — Progress Notes (Addendum)
 PROGRESS NOTE        PATIENT DETAILS Name: Jack Cunningham Age: 63 y.o. Sex: male Date of Birth: Jun 22, 1960 Admit Date: 09/02/2023 Admitting Physician Toribio JAYSON Sharps, MD ERE:[facility], No Pcp Per  Brief Summary: Patient is a 63 y.o.  male with history of DM-2 (recent diagnosis), HTN, asthma/OSA-who was brought to the hospital by EMS-after they were called by his spouse-when she heard a loud sound-and found the patient on the floor asking for help.  Apparently patient was recently diagnosed with DM-following several weeks of not feeling well and polydipsia.  In the ED-he was lethargic-upon further evaluation-he was found to have DKA, AKI and sepsis secondary to pneumonia/pancreatitis.  He was subsequently admitted to the ICU-stabilized and transferred to [facility] on 7/8.  Significant events: 7/3>> Admit to ICU-DKA 7/4>> remained on insulin  drip, hypernatremia - remained on D5W 7/5>> remained on insulin  drip and D5W due to hypernatremia, overnight spike fever, CT Chest/Abd/Pelv showing pneumonia. Started on zosyn  and linezolid . 7/6>> endotool stopped 7/8>> transferred to [facility] with  Significant studies: 7/3>> CXR: No PNA. 7/3>> x-ray pelvis: No fracture/dislocation. 7/3>> CT head: No acute intracranial abnormality 7/3>> CT C-spine: No acute intracranial abnormality. 7/4>> renal ultrasound: No hydronephrosis. 7/5>> CT chest/abdomen/pelvis: Lobar consolidation right lower lobe lung.  Significant microbiology data: 7/3>> blood culture: No growth 7/5>> blood culture: No growth  Procedures: None  Consults: PCCM.  Subjective: Issues overnight-no abdominal pain-lying comfortably in bed.  Objective: Vitals: Blood pressure (!) 144/66, pulse 67, temperature (!) 97.2 F (36.2 C), temperature source Oral, resp. rate 12, height 5' 10 (1.778 m), weight 92.2 kg, SpO2 100%.   Exam: Gen Exam:Alert awake-not in any distress HEENT:atraumatic, normocephalic Chest: B/L  clear to auscultation anteriorly CVS:S1S2 regular Abdomen:soft non tender, non distended.  No gallstones. Extremities:no edema Neurology: Non focal Skin: no rash  Pertinent Labs/Radiology:    Latest Ref Rng & Units 09/09/2023    6:16 AM 09/08/2023    4:58 AM 09/07/2023    4:42 AM  CBC  WBC 4.0 - 10.5 K/uL 17.0  18.1  22.4   Hemoglobin 13.0 - 17.0 g/dL 9.0  9.5  89.7   Hematocrit 39.0 - 52.0 % 30.1  31.5  33.4   Platelets 150 - 400 K/uL 180  150  138     Lab Results  Component Value Date   NA 160 (H) 09/09/2023   K 3.8 09/09/2023   CL 121 (H) 09/09/2023   CO2 28 09/09/2023      Assessment/Plan: DKA Resolved with IVF/IV insulin   DM-2 with uncontrolled hyperglycemia/DKA on presentation (A1c 13.5) Hypoglycemic event yesterday-oral intake is not that great Decrease Semglee  to 35 units twice daily Stop Premeal NovoLog  for now Continue SSI and follow CBGs.  Recent Labs    09/08/23 1246 09/08/23 1633 09/09/23 0911  GLUCAP 75 268* 101*     Sepsis secondary to PNA (POA) Sepsis physiology has resolved Clinically improved Leukocytosis downtrending All cultures negative so far Completed 7 days of Zosyn  Remains on Zyvox -7 days planned. Completed a course of Zithromax  for atypical coverage.  AKI 2/2 combination of hemodynamic mediated kidney injury (losartan/sepsis physiology) and osmotic diuresis due to DKA Improving with supportive care-continue gentle hydration Follow electrolytes Avoid nephrotoxic agents  Hypernatremia Secondary to free water deficit No response to half-normal saline-switch to D5W today. Encourage oral intake.  Acute metabolic encephalopathy Suspect secondary  to DKA/AKI/sepsis/PNA Thankfully has improved after treatment of underlying etiologies-awake and alert this morning. CT head was negative.  Pancreatitis Possibly related to DKA CT abdomen negative for gallstones.  Triglycerides were not elevated. Unclear if her abdominal pain on  presentation. Abdomen currently soft-nontender on exam-tolerating initiation of diet.  HTN BP stable without use of any antihypertensives Losartan remains on hold.  HLD Resume fenofibrate/pravastatin-in next several days.  Bronchial asthma Not in exacerbation Continue bronchodilators.  Code status:   Code Status: Full Code   DVT Prophylaxis: heparin  injection 5,000 Units Start: 09/05/23 1045 SCDs Start: 09/02/23 1132   Family Communication: Spouse at bedside   Disposition Plan: Status is: Inpatient Remains inpatient appropriate because: Severity of illness   Planned Discharge Destination:Home   Diet: Diet Order             Diet Carb Modified Fluid consistency: Thin; Room service appropriate? Yes  Diet effective now                     Antimicrobial agents: Anti-infectives (From admission, onward)    Start     Dose/Rate Route Frequency Ordered Stop   09/07/23 1000  linezolid  (ZYVOX ) tablet 600 mg        600 mg Oral Every 12 hours 09/07/23 0912 09/11/23 0959   09/06/23 1000  azithromycin  (ZITHROMAX ) tablet 500 mg       Placed in Or Linked Group   500 mg Oral Daily 09/05/23 1334 09/07/23 0946   09/06/23 1000  azithromycin  (ZITHROMAX ) 500 mg in sodium chloride  0.9 % 250 mL IVPB       Placed in Or Linked Group   500 mg 250 mL/hr over 60 Minutes Intravenous Daily 09/05/23 1334 09/07/23 0946   09/05/23 1000  azithromycin  (ZITHROMAX ) tablet 500 mg  Status:  Discontinued        500 mg Oral Daily 09/05/23 0741 09/05/23 1334   09/04/23 2200  piperacillin -tazobactam (ZOSYN ) IVPB 3.375 g        3.375 g 12.5 mL/hr over 240 Minutes Intravenous Every 8 hours 09/04/23 2055 09/09/23 0111   09/04/23 2200  linezolid  (ZYVOX ) IVPB 600 mg  Status:  Discontinued        600 mg 300 mL/hr over 60 Minutes Intravenous Every 12 hours 09/04/23 2055 09/07/23 0912   09/04/23 0200  piperacillin -tazobactam (ZOSYN ) IVPB 3.375 g  Status:  Discontinued        3.375 g 12.5 mL/hr over  240 Minutes Intravenous Every 12 hours 09/03/23 1630 09/04/23 0956   09/03/23 1100  cefTRIAXone  (ROCEPHIN ) 2 g in sodium chloride  0.9 % 100 mL IVPB  Status:  Discontinued        2 g 200 mL/hr over 30 Minutes Intravenous Every 24 hours 09/02/23 1246 09/02/23 1247   09/03/23 1100  cefTRIAXone  (ROCEPHIN ) 2 g in sodium chloride  0.9 % 100 mL IVPB  Status:  Discontinued        2 g 200 mL/hr over 30 Minutes Intravenous Every 24 hours 09/02/23 1248 09/02/23 1414   09/03/23 0000  cefTRIAXone  (ROCEPHIN ) 2 g in sodium chloride  0.9 % 100 mL IVPB  Status:  Discontinued        2 g 200 mL/hr over 30 Minutes Intravenous Every 24 hours 09/02/23 1247 09/02/23 1248   09/02/23 1515  piperacillin -tazobactam (ZOSYN ) IVPB 3.375 g  Status:  Discontinued        3.375 g 12.5 mL/hr over 240 Minutes Intravenous Every 8 hours 09/02/23 1426 09/03/23 1630  09/02/23 1100  cefTRIAXone  (ROCEPHIN ) 2 g in sodium chloride  0.9 % 100 mL IVPB        2 g 200 mL/hr over 30 Minutes Intravenous  Once 09/02/23 1047 09/02/23 1155   09/02/23 1100  metroNIDAZOLE  (FLAGYL ) IVPB 500 mg        500 mg 100 mL/hr over 60 Minutes Intravenous  Once 09/02/23 1047 09/02/23 1230        MEDICATIONS: Scheduled Meds:  arformoterol   15 mcg Nebulization BID   budesonide  (PULMICORT ) nebulizer solution  0.5 mg Nebulization BID   Chlorhexidine  Gluconate Cloth  6 each Topical Daily   heparin  injection (subcutaneous)  5,000 Units Subcutaneous Q8H   insulin  aspart  0-15 Units Subcutaneous TID WC   insulin  aspart  0-5 Units Subcutaneous QHS   insulin  glargine-yfgn  35 Units Subcutaneous BID   insulin  starter kit- pen needles  1 kit Other Once   linezolid   600 mg Oral Q12H   mouth rinse  15 mL Mouth Rinse 4 times per day   pantoprazole   40 mg Oral Daily   Continuous Infusions:  dextrose  50 mL/hr at 09/09/23 0926   promethazine  (PHENERGAN ) injection (IM or IVPB)     PRN Meds:.acetaminophen  **OR** acetaminophen , antiseptic oral rinse, dextrose ,  glucagon  (human recombinant), hydrALAZINE , ipratropium-albuterol , lip balm, metoprolol  tartrate, ondansetron  (ZOFRAN ) IV, mouth rinse, polyethylene glycol, promethazine  (PHENERGAN ) injection (IM or IVPB)   I have personally reviewed following labs and imaging studies  LABORATORY DATA: CBC: Recent Labs  Lab 09/05/23 2201 09/06/23 0900 09/07/23 0442 09/08/23 0458 09/09/23 0616  WBC 19.6* 21.2* 22.4* 18.1* 17.0*  HGB 12.5* 11.4* 10.2* 9.5* 9.0*  HCT 41.6 36.5* 33.4* 31.5* 30.1*  MCV 95.0 93.4 94.9 96.0 98.0  PLT 103* 110* 138* 150 180    Basic Metabolic Panel: Recent Labs  Lab 09/03/23 0140 09/03/23 0704 09/05/23 0331 09/05/23 0948 09/05/23 2201 09/06/23 0347 09/07/23 1355 09/07/23 1740 09/07/23 2149 09/08/23 0458 09/09/23 0616  NA 162*   < > 152*   < > 146*   < > 151* 153* 156* 160* 160*  K 4.0   < > 3.8   < > 3.8   < > 3.7 3.9 4.0 4.0 3.8  CL 121*   < > 117*   < > 111   < > 116* 117* 120* 123* 121*  CO2 27   < > 26   < > 25   < > 22 25 27 25 28   GLUCOSE 261*   < > 175*   < > 163*   < > 375* 263* 186* 135* 80  BUN 101*   < > 81*   < > 68*   < > 88* 84* 73* 68* 46*  CREATININE 3.73*   < > 3.25*   < > 3.17*   < > 2.55* 2.44* 2.37* 2.33* 2.00*  CALCIUM  9.7   < > 8.2*   < > 7.8*   < > 8.4* 8.6* 8.7* 8.5* 8.5*  MG 4.0*  --  3.3*  --  3.1*  --   --   --   --  3.2*  --   PHOS  --   --  2.4*  --  2.9  --   --   --   --  3.5  --    < > = values in this interval not displayed.    GFR: Estimated Creatinine Clearance: 43.7 mL/min (A) (by C-G formula based on SCr of 2 mg/dL (H)).  Liver Function  Tests: Recent Labs  Lab 09/02/23 1303 09/03/23 0704  AST 18 45*  ALT 39 38  ALKPHOS 57 52  BILITOT 1.7* 1.1  PROT 6.8 7.4  ALBUMIN 3.3* 3.3*   Recent Labs  Lab 09/02/23 2013 09/03/23 1811  LIPASE 2,665* 582*   Recent Labs  Lab 09/03/23 0704  AMMONIA 61*    Coagulation Profile: Recent Labs  Lab 09/03/23 0704  INR 1.1    Cardiac Enzymes: No results for  input(s): CKTOTAL, CKMB, CKMBINDEX, TROPONINI in the last 168 hours.   BNP (last 3 results) No results for input(s): PROBNP in the last 8760 hours.  Lipid Profile: No results for input(s): CHOL, HDL, LDLCALC, TRIG, CHOLHDL, LDLDIRECT in the last 72 hours.  Thyroid Function Tests: No results for input(s): TSH, T4TOTAL, FREET4, T3FREE, THYROIDAB in the last 72 hours.  Anemia Panel: No results for input(s): VITAMINB12, FOLATE, FERRITIN, TIBC, IRON, RETICCTPCT in the last 72 hours.  Urine analysis:    Component Value Date/Time   COLORURINE YELLOW 09/02/2023 0909   APPEARANCEUR HAZY (A) 09/02/2023 0909   LABSPEC 1.021 09/02/2023 0909   PHURINE 5.0 09/02/2023 0909   GLUCOSEU >=500 (A) 09/02/2023 0909   HGBUR MODERATE (A) 09/02/2023 0909   BILIRUBINUR NEGATIVE 09/02/2023 0909   KETONESUR 5 (A) 09/02/2023 0909   PROTEINUR 100 (A) 09/02/2023 0909   NITRITE NEGATIVE 09/02/2023 0909   LEUKOCYTESUR NEGATIVE 09/02/2023 0909    Sepsis Labs: Lactic Acid, Venous    Component Value Date/Time   LATICACIDVEN 2.7 (HH) 09/02/2023 1303    MICROBIOLOGY: Recent Results (from the past 240 hours)  Blood culture (routine x 2)     Status: None   Collection Time: 09/02/23  9:10 AM   Specimen: BLOOD  Result Value Ref Range Status   Specimen Description BLOOD RIGHT ANTECUBITAL  Final   Special Requests   Final    BOTTLES DRAWN AEROBIC AND ANAEROBIC Blood Culture results may not be optimal due to an inadequate volume of blood received in culture bottles   Culture   Final    NO GROWTH 5 DAYS Performed at [facility], [address], [place], [place] [address]    Report Status 09/07/2023 FINAL  Final  Blood culture (routine x 2)     Status: None   Collection Time: 09/02/23  9:50 AM   Specimen: BLOOD LEFT HAND  Result Value Ref Range Status   Specimen Description BLOOD LEFT HAND  Final   Special Requests   Final    BOTTLES DRAWN AEROBIC AND  ANAEROBIC Blood Culture adequate volume   Culture   Final    NO GROWTH 5 DAYS Performed at [facility], [address] [place], [place] [address]    Report Status 09/07/2023 FINAL  Final  MRSA Next Gen by PCR, Nasal     Status: None   Collection Time: 09/02/23 11:32 AM   Specimen: Nasal Mucosa; Nasal Swab  Result Value Ref Range Status   MRSA by PCR Next Gen NOT DETECTED NOT DETECTED Final    Comment: (NOTE) The GeneXpert MRSA Assay (FDA approved for NASAL specimens only), is one component of a comprehensive MRSA colonization surveillance program. It is not intended to diagnose MRSA infection nor to guide or monitor treatment for MRSA infections. Test performance is not FDA approved in patients less than 62 years old. Performed at [facility], [address], [place], [place] [address]   Culture, blood (Routine X 2) w Reflex to ID  Panel     Status: None   Collection Time: 09/04/23 11:42 PM   Specimen: BLOOD  Result Value Ref Range Status   Specimen Description BLOOD RIGHT ARM  Final   Special Requests   Final    BOTTLES DRAWN AEROBIC AND ANAEROBIC Blood Culture results may not be optimal due to an inadequate volume of blood received in culture bottles   Culture   Final    NO GROWTH 5 DAYS Performed at [facility], [address] [place], [place] [address]    Report Status 09/09/2023 FINAL  Final  Culture, blood (Routine X 2) w Reflex to ID Panel     Status: None   Collection Time: 09/04/23 11:45 PM   Specimen: BLOOD  Result Value Ref Range Status   Specimen Description BLOOD RIGHT HAND  Final   Special Requests   Final    BOTTLES DRAWN AEROBIC AND ANAEROBIC Blood Culture results may not be optimal due to an inadequate volume of blood received in culture bottles   Culture   Final    NO GROWTH 5 DAYS Performed at [facility], [address] [place], [place] [address]    Report Status 09/09/2023 FINAL  Final    RADIOLOGY STUDIES/RESULTS: No  results found.   LOS: 7 days   Donalda Applebaum, MD  [facility]    To contact the attending provider between 7A-7P or the covering provider during after hours 7P-7A, please log into the web site [email or web address] and access using universal [place] password for that web site. If you do not have the password, please call the hospital operator.  09/09/2023, 12:00 PM

## 2023-09-09 NOTE — Plan of Care (Signed)

## 2023-09-10 DIAGNOSIS — E1010 Type 1 diabetes mellitus with ketoacidosis without coma: Secondary | ICD-10-CM | POA: Diagnosis not present

## 2023-09-10 DIAGNOSIS — W19XXXA Unspecified fall, initial encounter: Secondary | ICD-10-CM | POA: Diagnosis not present

## 2023-09-10 DIAGNOSIS — E870 Hyperosmolality and hypernatremia: Secondary | ICD-10-CM | POA: Diagnosis not present

## 2023-09-10 LAB — CBC
HCT: 30.7 % — ABNORMAL LOW (ref 39.0–52.0)
Hemoglobin: 9.2 g/dL — ABNORMAL LOW (ref 13.0–17.0)
MCH: 28.9 pg (ref 26.0–34.0)
MCHC: 30 g/dL (ref 30.0–36.0)
MCV: 96.5 fL (ref 80.0–100.0)
Platelets: 220 K/uL (ref 150–400)
RBC: 3.18 MIL/uL — ABNORMAL LOW (ref 4.22–5.81)
RDW: 12.3 % (ref 11.5–15.5)
WBC: 20 K/uL — ABNORMAL HIGH (ref 4.0–10.5)
nRBC: 0.1 % (ref 0.0–0.2)

## 2023-09-10 LAB — BASIC METABOLIC PANEL WITH GFR
Anion gap: 10 (ref 5–15)
BUN: 30 mg/dL — ABNORMAL HIGH (ref 8–23)
CO2: 25 mmol/L (ref 22–32)
Calcium: 8.8 mg/dL — ABNORMAL LOW (ref 8.9–10.3)
Chloride: 122 mmol/L — ABNORMAL HIGH (ref 98–111)
Creatinine, Ser: 1.75 mg/dL — ABNORMAL HIGH (ref 0.61–1.24)
GFR, Estimated: 43 mL/min — ABNORMAL LOW (ref >60)
Glucose, Bld: 126 mg/dL — ABNORMAL HIGH (ref 70–99)
Potassium: 3.5 mmol/L (ref 3.5–5.1)
Sodium: 157 mmol/L — ABNORMAL HIGH (ref 135–145)

## 2023-09-10 LAB — GLUCOSE, CAPILLARY
Glucose-Capillary: 36 mg/dL — CL (ref 70–99)
Glucose-Capillary: 167 mg/dL — ABNORMAL HIGH (ref 70–99)
Glucose-Capillary: 318 mg/dL — ABNORMAL HIGH (ref 70–99)
Glucose-Capillary: 108 mg/dL — ABNORMAL HIGH (ref 70–99)
Glucose-Capillary: 146 mg/dL — ABNORMAL HIGH (ref 70–99)

## 2023-09-10 MED ORDER — INSULIN GLARGINE-YFGN 100 UNIT/ML ~~LOC~~ SOLN
30.0000 [IU] | Freq: Two times a day (BID) | SUBCUTANEOUS | Status: DC
  Filled 2023-09-10 (×2): qty 0.3

## 2023-09-10 MED ORDER — INSULIN ASPART 100 UNIT/ML IJ SOLN: 3.0000 [IU] | Freq: Three times a day (TID) | INTRAMUSCULAR | Status: DC

## 2023-09-10 MED ORDER — INSULIN GLARGINE-YFGN 100 UNIT/ML ~~LOC~~ SOLN
25.0000 [IU] | Freq: Two times a day (BID) | SUBCUTANEOUS | Status: DC
  Administered 2023-09-10: 25 [IU] via SUBCUTANEOUS
  Filled 2023-09-10 (×2): qty 0.25

## 2023-09-10 MED ORDER — INSULIN ASPART 100 UNIT/ML IJ SOLN: 0.0000 [IU] | Freq: Three times a day (TID) | INTRAMUSCULAR | Status: DC

## 2023-09-10 MED ORDER — INSULIN ASPART 100 UNIT/ML IJ SOLN
16.0000 [IU] | Freq: Once | INTRAMUSCULAR | Status: AC
  Administered 2023-09-10: 16 [IU] via SUBCUTANEOUS

## 2023-09-10 MED ORDER — INSULIN GLARGINE-YFGN 100 UNIT/ML ~~LOC~~ SOLN
25.0000 [IU] | Freq: Two times a day (BID) | SUBCUTANEOUS | Status: DC
  Administered 2023-09-10: 25 [IU] via SUBCUTANEOUS
  Filled 2023-09-10 (×4): qty 0.25

## 2023-09-10 MED ORDER — ENSURE PLUS HIGH PROTEIN PO LIQD
237.0000 mL | Freq: Two times a day (BID) | ORAL | Status: DC
  Administered 2023-09-10 – 2023-09-16 (×11): 237 mL via ORAL

## 2023-09-10 MED ORDER — DEXTROSE 5 % IV SOLN: INTRAVENOUS | Status: DC

## 2023-09-10 MED ORDER — INSULIN ASPART 100 UNIT/ML IJ SOLN
0.0000 [IU] | Freq: Every day | INTRAMUSCULAR | Status: DC
  Administered 2023-09-10: 2 [IU] via SUBCUTANEOUS

## 2023-09-10 NOTE — Progress Notes (Signed)
 PROGRESS NOTE        PATIENT DETAILS Name: Jack Cunningham Age: 63 y.o. Sex: male Date of Birth: Sep 13, 1960 Admit Date: 09/02/2023 Admitting Physician Toribio JAYSON Sharps, MD ERE:[place], No Pcp Per  Brief Summary: Patient is a 63 y.o.  male with history of DM-2 (recent diagnosis), HTN, asthma/OSA-who was brought to the hospital by EMS-after they were called by his spouse-when she heard a loud sound-and found the patient on the floor asking for help.  Apparently patient was recently diagnosed with DM-following several weeks of not feeling well and polydipsia.  In the ED-he was lethargic-upon further evaluation-he was found to have DKA, AKI and sepsis secondary to pneumonia/pancreatitis.  He was subsequently admitted to the ICU-stabilized and transferred to [facility] on 7/8.  Significant events: 7/3>> Admit to ICU-DKA 7/4>> remained on insulin  drip, hypernatremia - remained on D5W 7/5>> remained on insulin  drip and D5W due to hypernatremia, overnight spike fever, CT Chest/Abd/Pelv showing pneumonia. Started on zosyn  and linezolid . 7/6>> endotool stopped 7/8>> transferred to [facility] with  Significant studies: 7/3>> CXR: No PNA. 7/3>> x-ray pelvis: No fracture/dislocation. 7/3>> CT head: No acute intracranial abnormality 7/3>> CT C-spine: No acute intracranial abnormality. 7/4>> renal ultrasound: No hydronephrosis. 7/5>> CT chest/abdomen/pelvis: Lobar consolidation right lower lobe lung.  Significant microbiology data: 7/3>> blood culture: No growth 7/5>> blood culture: No growth  Procedures: None  Consults: PCCM.  Subjective: No major issues overnight-appetite has improved-he has no abdominal pain.  Objective: Vitals: Blood pressure 137/73, pulse 77, temperature 98.4 F (36.9 C), temperature source Oral, resp. rate 11, height 5' 10 (1.778 m), weight 92.2 kg, SpO2 100%.   Exam: Gen Exam:Alert awake-not in any distress HEENT:atraumatic, normocephalic Chest: B/L  clear to auscultation anteriorly CVS:S1S2 regular Abdomen:soft non tender, non distended Extremities:no edema Neurology: Non focal Skin: no rash  Pertinent Labs/Radiology:    Latest Ref Rng & Units 09/10/2023    6:36 AM 09/09/2023    6:16 AM 09/08/2023    4:58 AM  CBC  WBC 4.0 - 10.5 K/uL 20.0  17.0  18.1   Hemoglobin 13.0 - 17.0 g/dL 9.2  9.0  9.5   Hematocrit 39.0 - 52.0 % 30.7  30.1  31.5   Platelets 150 - 400 K/uL 220  180  150     Lab Results  Component Value Date   NA 157 (H) 09/10/2023   K 3.5 09/10/2023   CL 122 (H) 09/10/2023   CO2 25 09/10/2023      Assessment/Plan: DKA Resolved with IVF/IV insulin   DM-2 with uncontrolled hyperglycemia/DKA on presentation (A1c 13.5) Another hypoglycemic event this morning Decrease Semglee  to 25 units twice daily Change SSI to sensitive scale Follow and optimize  Recent Labs    09/09/23 1936 09/10/23 0756 09/10/23 0902  GLUCAP 183* 36* 167*    Sepsis secondary to PNA (POA) Sepsis physiology has resolved Clinically improved Leukocytosis downtrending All cultures negative so far Completed 7 days of Zosyn  Remains on Zyvox -7 days planned. Completed a course of Zithromax  for atypical coverage.  AKI 2/2 combination of hemodynamic mediated kidney injury (losartan/sepsis physiology) and osmotic diuresis due to DKA Improving with supportive care-continue gentle hydration Follow electrolytes Avoid nephrotoxic agents  Hypernatremia Secondary to dehydration/free water deficit Slowly improving-cautiously continue with D5W for a few more hours today Repeat electrolytes tomorrow Continue to encourage oral intake  Acute metabolic encephalopathy Suspect secondary  to DKA/AKI/sepsis/PNA Thankfully has improved after treatment of underlying etiologies-awake and alert this morning. CT head was negative.  Pancreatitis Possibly related to DKA CT abdomen negative for gallstones.  Triglycerides were not elevated. Unclear if her  abdominal pain on presentation. Abdomen currently soft-nontender on exam-tolerating initiation of diet.  HTN BP stable without use of any antihypertensives Losartan remains on hold.  HLD Resume fenofibrate /pravastatin -in next several days.  Bronchial asthma Not in exacerbation Continue bronchodilators.  Code status:   Code Status: Full Code   DVT Prophylaxis: heparin  injection 5,000 Units Start: 09/05/23 1045 SCDs Start: 09/02/23 1132   Family Communication: Spouse at bedside on 7/10, no one at bedside on 7/11   Disposition Plan: Status is: Inpatient Remains inpatient appropriate because: Severity of illness   Planned Discharge Destination:Home   Diet: Diet Order             Diet Carb Modified Fluid consistency: Thin; Room service appropriate? Yes  Diet effective now                     Antimicrobial agents: Anti-infectives (From admission, onward)    Start     Dose/Rate Route Frequency Ordered Stop   09/07/23 1000  linezolid  (ZYVOX ) tablet 600 mg        600 mg Oral Every 12 hours 09/07/23 0912 09/11/23 0959   09/06/23 1000  azithromycin  (ZITHROMAX ) tablet 500 mg       Placed in Or Linked Group   500 mg Oral Daily 09/05/23 1334 09/07/23 0946   09/06/23 1000  azithromycin  (ZITHROMAX ) 500 mg in sodium chloride  0.9 % 250 mL IVPB       Placed in Or Linked Group   500 mg 250 mL/hr over 60 Minutes Intravenous Daily 09/05/23 1334 09/07/23 0946   09/05/23 1000  azithromycin  (ZITHROMAX ) tablet 500 mg  Status:  Discontinued        500 mg Oral Daily 09/05/23 0741 09/05/23 1334   09/04/23 2200  piperacillin -tazobactam (ZOSYN ) IVPB 3.375 g        3.375 g 12.5 mL/hr over 240 Minutes Intravenous Every 8 hours 09/04/23 2055 09/09/23 0111   09/04/23 2200  linezolid  (ZYVOX ) IVPB 600 mg  Status:  Discontinued        600 mg 300 mL/hr over 60 Minutes Intravenous Every 12 hours 09/04/23 2055 09/07/23 0912   09/04/23 0200  piperacillin -tazobactam (ZOSYN ) IVPB 3.375 g   Status:  Discontinued        3.375 g 12.5 mL/hr over 240 Minutes Intravenous Every 12 hours 09/03/23 1630 09/04/23 0956   09/03/23 1100  cefTRIAXone  (ROCEPHIN ) 2 g in sodium chloride  0.9 % 100 mL IVPB  Status:  Discontinued        2 g 200 mL/hr over 30 Minutes Intravenous Every 24 hours 09/02/23 1246 09/02/23 1247   09/03/23 1100  cefTRIAXone  (ROCEPHIN ) 2 g in sodium chloride  0.9 % 100 mL IVPB  Status:  Discontinued        2 g 200 mL/hr over 30 Minutes Intravenous Every 24 hours 09/02/23 1248 09/02/23 1414   09/03/23 0000  cefTRIAXone  (ROCEPHIN ) 2 g in sodium chloride  0.9 % 100 mL IVPB  Status:  Discontinued        2 g 200 mL/hr over 30 Minutes Intravenous Every 24 hours 09/02/23 1247 09/02/23 1248   09/02/23 1515  piperacillin -tazobactam (ZOSYN ) IVPB 3.375 g  Status:  Discontinued        3.375 g 12.5 mL/hr over 240 Minutes Intravenous Every  8 hours 09/02/23 1426 09/03/23 1630   09/02/23 1100  cefTRIAXone  (ROCEPHIN ) 2 g in sodium chloride  0.9 % 100 mL IVPB        2 g 200 mL/hr over 30 Minutes Intravenous  Once 09/02/23 1047 09/02/23 1155   09/02/23 1100  metroNIDAZOLE  (FLAGYL ) IVPB 500 mg        500 mg 100 mL/hr over 60 Minutes Intravenous  Once 09/02/23 1047 09/02/23 1230        MEDICATIONS: Scheduled Meds:  arformoterol   15 mcg Nebulization BID   budesonide  (PULMICORT ) nebulizer solution  0.5 mg Nebulization BID   Chlorhexidine  Gluconate Cloth  6 each Topical Daily   feeding supplement  237 mL Oral BID BM   heparin  injection (subcutaneous)  5,000 Units Subcutaneous Q8H   insulin  aspart  0-9 Units Subcutaneous TID WC   insulin  glargine-yfgn  30 Units Subcutaneous BID   insulin  starter kit- pen needles  1 kit Other Once   linezolid   600 mg Oral Q12H   mouth rinse  15 mL Mouth Rinse 4 times per day   pantoprazole   40 mg Oral Daily   Continuous Infusions:  promethazine  (PHENERGAN ) injection (IM or IVPB)     PRN Meds:.acetaminophen  **OR** acetaminophen , antiseptic oral rinse,  dextrose , hydrALAZINE , ipratropium-albuterol , lip balm, metoprolol  tartrate, ondansetron  (ZOFRAN ) IV, mouth rinse, polyethylene glycol, promethazine  (PHENERGAN ) injection (IM or IVPB)   I have personally reviewed following labs and imaging studies  LABORATORY DATA: CBC: Recent Labs  Lab 09/06/23 0900 09/07/23 0442 09/08/23 0458 09/09/23 0616 09/10/23 0636  WBC 21.2* 22.4* 18.1* 17.0* 20.0*  HGB 11.4* 10.2* 9.5* 9.0* 9.2*  HCT 36.5* 33.4* 31.5* 30.1* 30.7*  MCV 93.4 94.9 96.0 98.0 96.5  PLT 110* 138* 150 180 220    Basic Metabolic Panel: Recent Labs  Lab 09/05/23 0331 09/05/23 0948 09/05/23 2201 09/06/23 0347 09/07/23 2149 09/08/23 0458 09/09/23 0616 09/09/23 2210 09/10/23 0636  NA 152*   < > 146*   < > 156* 160* 160* 150* 157*  K 3.8   < > 3.8   < > 4.0 4.0 3.8 3.9 3.5  CL 117*   < > 111   < > 120* 123* 121* 117* 122*  CO2 26   < > 25   < > 27 25 28 24 25   GLUCOSE 175*   < > 163*   < > 186* 135* 80 366* 126*  BUN 81*   < > 68*   < > 73* 68* 46* 35* 30*  CREATININE 3.25*   < > 3.17*   < > 2.37* 2.33* 2.00* 1.72* 1.75*  CALCIUM  8.2*   < > 7.8*   < > 8.7* 8.5* 8.5* 8.5* 8.8*  MG 3.3*  --  3.1*  --   --  3.2*  --   --   --   PHOS 2.4*  --  2.9  --   --  3.5  --   --   --    < > = values in this interval not displayed.    GFR: Estimated Creatinine Clearance: 50 mL/min (A) (by C-G formula based on SCr of 1.75 mg/dL (H)).  Liver Function Tests: No results for input(s): AST, ALT, ALKPHOS, BILITOT, PROT, ALBUMIN in the last 168 hours.  Recent Labs  Lab 09/03/23 1811  LIPASE 582*   No results for input(s): AMMONIA in the last 168 hours.   Coagulation Profile: No results for input(s): INR, PROTIME in the last 168 hours.   Cardiac  Enzymes: No results for input(s): CKTOTAL, CKMB, CKMBINDEX, TROPONINI in the last 168 hours.   BNP (last 3 results) No results for input(s): PROBNP in the last 8760 hours.  Lipid Profile: No results for  input(s): CHOL, HDL, LDLCALC, TRIG, CHOLHDL, LDLDIRECT in the last 72 hours.  Thyroid Function Tests: No results for input(s): TSH, T4TOTAL, FREET4, T3FREE, THYROIDAB in the last 72 hours.  Anemia Panel: No results for input(s): VITAMINB12, FOLATE, FERRITIN, TIBC, IRON, RETICCTPCT in the last 72 hours.  Urine analysis:    Component Value Date/Time   COLORURINE YELLOW 09/02/2023 0909   APPEARANCEUR HAZY (A) 09/02/2023 0909   LABSPEC 1.021 09/02/2023 0909   PHURINE 5.0 09/02/2023 0909   GLUCOSEU >=500 (A) 09/02/2023 0909   HGBUR MODERATE (A) 09/02/2023 0909   BILIRUBINUR NEGATIVE 09/02/2023 0909   KETONESUR 5 (A) 09/02/2023 0909   PROTEINUR 100 (A) 09/02/2023 0909   NITRITE NEGATIVE 09/02/2023 0909   LEUKOCYTESUR NEGATIVE 09/02/2023 0909    Sepsis Labs: Lactic Acid, Venous    Component Value Date/Time   LATICACIDVEN 2.7 (HH) 09/02/2023 1303    MICROBIOLOGY: Recent Results (from the past 240 hours)  Blood culture (routine x 2)     Status: None   Collection Time: 09/02/23  9:10 AM   Specimen: BLOOD  Result Value Ref Range Status   Specimen Description BLOOD RIGHT ANTECUBITAL  Final   Special Requests   Final    BOTTLES DRAWN AEROBIC AND ANAEROBIC Blood Culture results may not be optimal due to an inadequate volume of blood received in culture bottles   Culture   Final    NO GROWTH 5 DAYS Performed at [facility], [address], [place], [place] [address]    Report Status 09/07/2023 FINAL  Final  Blood culture (routine x 2)     Status: None   Collection Time: 09/02/23  9:50 AM   Specimen: BLOOD LEFT HAND  Result Value Ref Range Status   Specimen Description BLOOD LEFT HAND  Final   Special Requests   Final    BOTTLES DRAWN AEROBIC AND ANAEROBIC Blood Culture adequate volume   Culture   Final    NO GROWTH 5 DAYS Performed at [facility], [address] [place], [place] [address]    Report Status 09/07/2023 FINAL   Final  MRSA Next Gen by PCR, Nasal     Status: None   Collection Time: 09/02/23 11:32 AM   Specimen: Nasal Mucosa; Nasal Swab  Result Value Ref Range Status   MRSA by PCR Next Gen NOT DETECTED NOT DETECTED Final    Comment: (NOTE) The GeneXpert MRSA Assay (FDA approved for NASAL specimens only), is one component of a comprehensive MRSA colonization surveillance program. It is not intended to diagnose MRSA infection nor to guide or monitor treatment for MRSA infections. Test performance is not FDA approved in patients less than 37 years old. Performed at [facility], [address], [place], [place] [address]   Culture, blood (Routine X 2) w Reflex to ID Panel     Status: None   Collection Time: 09/04/23 11:42 PM   Specimen: BLOOD  Result Value Ref Range Status   Specimen Description BLOOD RIGHT ARM  Final   Special Requests   Final    BOTTLES DRAWN AEROBIC AND ANAEROBIC Blood Culture results may not be optimal due to an inadequate volume of blood received in culture bottles   Culture   Final    NO  GROWTH 5 DAYS Performed at [facility], [address] [place], [place] [address]    Report Status 09/09/2023 FINAL  Final  Culture, blood (Routine X 2) w Reflex to ID Panel     Status: None   Collection Time: 09/04/23 11:45 PM   Specimen: BLOOD  Result Value Ref Range Status   Specimen Description BLOOD RIGHT HAND  Final   Special Requests   Final    BOTTLES DRAWN AEROBIC AND ANAEROBIC Blood Culture results may not be optimal due to an inadequate volume of blood received in culture bottles   Culture   Final    NO GROWTH 5 DAYS Performed at [facility], [address], [place], [place] [address]    Report Status 09/09/2023 FINAL  Final    RADIOLOGY STUDIES/RESULTS: No results found.   LOS: 8 days   Donalda Applebaum, MD  [facility]    To contact the attending provider between 7A-7P or the covering provider during after hours 7P-7A, please log  into the web site [email or web address] and access using universal [place] password for that web site. If you do not have the password, please call the hospital operator.  09/10/2023, 9:25 AM

## 2023-09-10 NOTE — Progress Notes (Signed)
 Physical Therapy Treatment Patient Details Name: Jack Cunningham MRN: [id number] DOB: 1960/06/27 Today's Date: 09/10/2023   History of Present Illness 63 y/o male presenting to ED after being found on the floor and progressively unresponsive, GCS 3-4 with EMS. More responsive in ED,but hypothermic with glucose >1200 Admitted for DKA PMH of recent dx of DM, HTN, asthma, OSA    PT Comments  Pt tolerated treatment well today. Pt today was able to ambulate in hallway with no AD and navigate stairs with CGA. Pt presents at or near baseline mobility. Pt has no further acute PT needs and will be signing off. No change in DC/DME recs at this time. Re consult PT if mobility status changes. Pt would benefit from continued mobility with mobility specialist during acute stay.     If plan is discharge home, recommend the following: A little help with walking and/or transfers;A little help with bathing/dressing/bathroom;Assistance with cooking/housework;Direct supervision/assist for medications management;Direct supervision/assist for financial management;Assist for transportation;Help with stairs or ramp for entrance   Can travel by private vehicle        Equipment Recommendations  Rolling walker (2 wheels)    Recommendations for Other Services       Precautions / Restrictions Precautions Precautions: Fall Recall of Precautions/Restrictions: Intact Restrictions Weight Bearing Restrictions Per Provider Order: No     Mobility  Bed Mobility Overal bed mobility: Modified Independent                  Transfers Overall transfer level: Needs assistance Equipment used: None Transfers: Sit to/from Stand Sit to Stand: Contact guard assist           General transfer comment: no LOB noted    Ambulation/Gait Ambulation/Gait assistance: Contact guard assist Gait Distance (Feet): 300 Feet Assistive device: None Gait Pattern/deviations: Step-through pattern, Decreased stride length,  Drifts right/left Gait velocity: slowed     General Gait Details: no LOB noted. Slowed step through pattern.   Stairs Stairs: Yes Stairs assistance: Contact guard assist Stair Management: No rails, One rail Right, Alternating pattern, Forwards Number of Stairs: 10 General stair comments: no LOB noted   Wheelchair Mobility     Tilt Bed    Modified Rankin (Stroke Patients Only)       Balance Overall balance assessment: Needs assistance Sitting-balance support: Feet supported, No upper extremity supported Sitting balance-Leahy Scale: Good     Standing balance support: During functional activity, Reliant on assistive device for balance, Bilateral upper extremity supported Standing balance-Leahy Scale: Fair Standing balance comment: benefits from BUE support with dynamic activity                            Communication Communication Communication: No apparent difficulties  Cognition Arousal: Alert Behavior During Therapy: WFL for tasks assessed/performed, Flat affect   PT - Cognitive impairments: Safety/Judgement, Problem solving, Sequencing                       PT - Cognition Comments: Pt with slowed response Following commands: Impaired Following commands impaired: Only follows one step commands consistently, Follows multi-step commands inconsistently    Cueing Cueing Techniques: Verbal cues  Exercises      General Comments General comments (skin integrity, edema, etc.): VSS      Pertinent Vitals/Pain Pain Assessment Pain Assessment: No/denies pain    Home Living  Prior Function            PT Goals (current goals can now be found in the care plan section) Progress towards PT goals: Goals met/education completed, patient discharged from PT    Frequency    Min 3X/week      PT Plan      Co-evaluation              AM-PAC PT 6 Clicks Mobility   Outcome Measure  Help needed  turning from your back to your side while in a flat bed without using bedrails?: None Help needed moving from lying on your back to sitting on the side of a flat bed without using bedrails?: A Little Help needed moving to and from a bed to a chair (including a wheelchair)?: A Little Help needed standing up from a chair using your arms (e.g., wheelchair or bedside chair)?: A Little Help needed to walk in hospital room?: A Little Help needed climbing 3-5 steps with a railing? : A Little 6 Click Score: 19    End of Session Equipment Utilized During Treatment: Gait belt Activity Tolerance: Patient tolerated treatment well Patient left: in chair;with call bell/phone within reach;with chair alarm set Nurse Communication: Mobility status PT Visit Diagnosis: Other abnormalities of gait and mobility (R26.89);Other symptoms and signs involving the nervous system (R29.898)     Time: 9154-9142 PT Time Calculation (min) (ACUTE ONLY): 12 min  Charges:    $Gait Training: 8-22 mins PT General Charges $$ ACUTE PT VISIT: 1 Visit                     Sueellen NOVAK, PT, DPT Acute Rehab Services [phone]    Nareh Matzke 09/10/2023, 4:01 PM

## 2023-09-10 NOTE — Plan of Care (Signed)

## 2023-09-11 ENCOUNTER — Inpatient Hospital Stay (HOSPITAL_COMMUNITY)

## 2023-09-11 DIAGNOSIS — E1110 Type 2 diabetes mellitus with ketoacidosis without coma: Secondary | ICD-10-CM | POA: Diagnosis not present

## 2023-09-11 LAB — GLUCOSE, CAPILLARY
Glucose-Capillary: 136 mg/dL — ABNORMAL HIGH (ref 70–99)
Glucose-Capillary: 179 mg/dL — ABNORMAL HIGH (ref 70–99)
Glucose-Capillary: 180 mg/dL — ABNORMAL HIGH (ref 70–99)
Glucose-Capillary: 188 mg/dL — ABNORMAL HIGH (ref 70–99)
Glucose-Capillary: 191 mg/dL — ABNORMAL HIGH (ref 70–99)
Glucose-Capillary: 47 mg/dL — ABNORMAL LOW (ref 70–99)
Glucose-Capillary: 78 mg/dL (ref 70–99)
Glucose-Capillary: <10 mg/dL — CL (ref 70–99)

## 2023-09-11 LAB — CBC WITH DIFFERENTIAL/PLATELET
Abs Immature Granulocytes: 0 K/uL (ref 0.00–0.07)
Basophils Absolute: 0 K/uL (ref 0.0–0.1)
Basophils Relative: 0 %
Eosinophils Absolute: 0.6 K/uL — ABNORMAL HIGH (ref 0.0–0.5)
Eosinophils Relative: 3 %
HCT: 28.8 % — ABNORMAL LOW (ref 39.0–52.0)
Hemoglobin: 8.6 g/dL — ABNORMAL LOW (ref 13.0–17.0)
Lymphocytes Relative: 11 %
Lymphs Abs: 2.3 K/uL (ref 0.7–4.0)
MCH: 29.1 pg (ref 26.0–34.0)
MCHC: 29.9 g/dL — ABNORMAL LOW (ref 30.0–36.0)
MCV: 97.3 fL (ref 80.0–100.0)
Monocytes Absolute: 0.8 K/uL (ref 0.1–1.0)
Monocytes Relative: 4 %
Neutro Abs: 17.2 K/uL — ABNORMAL HIGH (ref 1.7–7.7)
Neutrophils Relative %: 82 %
Platelets: 221 K/uL (ref 150–400)
RBC: 2.96 MIL/uL — ABNORMAL LOW (ref 4.22–5.81)
RDW: 12.4 % (ref 11.5–15.5)
WBC: 21 K/uL — ABNORMAL HIGH (ref 4.0–10.5)
nRBC: 0 /100{WBCs}
nRBC: 0.2 % (ref 0.0–0.2)

## 2023-09-11 LAB — BASIC METABOLIC PANEL WITH GFR
Anion gap: 10 (ref 5–15)
BUN: 27 mg/dL — ABNORMAL HIGH (ref 8–23)
CO2: 24 mmol/L (ref 22–32)
Calcium: 8.8 mg/dL — ABNORMAL LOW (ref 8.9–10.3)
Chloride: 123 mmol/L — ABNORMAL HIGH (ref 98–111)
Creatinine, Ser: 1.65 mg/dL — ABNORMAL HIGH (ref 0.61–1.24)
GFR, Estimated: 47 mL/min — ABNORMAL LOW (ref >60)
Glucose, Bld: 60 mg/dL — ABNORMAL LOW (ref 70–99)
Potassium: 3.7 mmol/L (ref 3.5–5.1)
Sodium: 157 mmol/L — ABNORMAL HIGH (ref 135–145)

## 2023-09-11 LAB — PHOSPHORUS: Phosphorus: 4.2 mg/dL (ref 2.5–4.6)

## 2023-09-11 LAB — MAGNESIUM: Magnesium: 2.2 mg/dL (ref 1.7–2.4)

## 2023-09-11 LAB — PROCALCITONIN: Procalcitonin: 6.98 ng/mL

## 2023-09-11 LAB — C-REACTIVE PROTEIN: CRP: 1 mg/dL — ABNORMAL HIGH (ref <1.0)

## 2023-09-11 MED ORDER — DEXTROSE 5 % IV SOLN: INTRAVENOUS | Status: AC

## 2023-09-11 MED ORDER — INSULIN GLARGINE-YFGN 100 UNIT/ML ~~LOC~~ SOLN
20.0000 [IU] | Freq: Two times a day (BID) | SUBCUTANEOUS | Status: DC
  Administered 2023-09-11: 20 [IU] via SUBCUTANEOUS
  Filled 2023-09-11 (×3): qty 0.2

## 2023-09-11 MED ORDER — INSULIN ASPART 100 UNIT/ML IJ SOLN
0.0000 [IU] | Freq: Three times a day (TID) | INTRAMUSCULAR | Status: DC
  Administered 2023-09-11: 2 [IU] via SUBCUTANEOUS
  Administered 2023-09-12 – 2023-09-13 (×3): 1 [IU] via SUBCUTANEOUS
  Administered 2023-09-13: 2 [IU] via SUBCUTANEOUS
  Administered 2023-09-13: 3 [IU] via SUBCUTANEOUS
  Administered 2023-09-14: 2 [IU] via SUBCUTANEOUS
  Administered 2023-09-14: 1 [IU] via SUBCUTANEOUS
  Administered 2023-09-15: 5 [IU] via SUBCUTANEOUS
  Administered 2023-09-16: 1 [IU] via SUBCUTANEOUS

## 2023-09-11 MED ORDER — INSULIN GLARGINE-YFGN 100 UNIT/ML ~~LOC~~ SOLN
25.0000 [IU] | Freq: Every day | SUBCUTANEOUS | Status: DC
  Filled 2023-09-11: qty 0.25

## 2023-09-11 NOTE — Progress Notes (Signed)
 PROGRESS NOTE        PATIENT DETAILS Name: Jack Cunningham Age: 63 y.o. Sex: male Date of Birth: [birth year] Admit Date: 09/02/2023 Admitting Physician Toribio JAYSON Sharps, MD ERE:Ejupzwu, No Pcp Per  Brief Summary: Patient is a 63 y.o.  male with history of DM-2 (recent diagnosis), HTN, asthma/OSA-who was brought to the hospital by EMS-after they were called by his spouse-when she heard a loud sound-and found the patient on the floor asking for help.  Apparently patient was recently diagnosed with DM-following several weeks of not feeling well and polydipsia.  In the ED-he was lethargic-upon further evaluation-he was found to have DKA, AKI and sepsis secondary to pneumonia/pancreatitis.  He was subsequently admitted to the ICU-stabilized and transferred to [facility] on 7/8.  Significant events: 7/3>> Admit to ICU-DKA 7/4>> remained on insulin  drip, hypernatremia - remained on D5W 7/5>> remained on insulin  drip and D5W due to hypernatremia, overnight spike fever, CT Chest/Abd/Pelv showing pneumonia. Started on zosyn  and linezolid . 7/6>> endotool stopped 7/8>> transferred to [facility] with  Significant studies: 7/3>> CXR: No PNA. 7/3>> x-ray pelvis: No fracture/dislocation. 7/3>> CT head: No acute intracranial abnormality 7/3>> CT C-spine: No acute intracranial abnormality. 7/4>> renal ultrasound: No hydronephrosis. 7/5>> CT chest/abdomen/pelvis: Lobar consolidation right lower lobe lung.  Significant microbiology data: 7/3>> blood culture: No growth 7/5>> blood culture: No growth  Procedures: None  Consults: PCCM.  Subjective: No major issues overnight-appetite has improved-he has no abdominal pain.  Objective: Vitals: Blood pressure (!) 132/53, pulse 87, temperature 98.6 F (37 C), temperature source Oral, resp. rate 16, height 5' 10 (1.778 m), weight 91.2 kg, SpO2 94%.   Exam: Gen Exam:Alert awake-not in any distress HEENT:atraumatic, normocephalic Chest:  B/L clear to auscultation anteriorly CVS:S1S2 regular Abdomen:soft non tender, non distended Extremities:no edema Neurology: Non focal Skin: no rash  Pertinent Labs/Radiology:    Latest Ref Rng & Units 09/11/2023    6:13 AM 09/10/2023    6:36 AM 09/09/2023    6:16 AM  CBC  WBC 4.0 - 10.5 K/uL 21.0  20.0  17.0   Hemoglobin 13.0 - 17.0 g/dL 8.6  9.2  9.0   Hematocrit 39.0 - 52.0 % 28.8  30.7  30.1   Platelets 150 - 400 K/uL 221  220  180     Lab Results  Component Value Date   NA 157 (H) 09/11/2023   K 3.7 09/11/2023   CL 123 (H) 09/11/2023   CO2 24 09/11/2023      Assessment/Plan:   DKA and newly diagnosed DM-2 with uncontrolled hyperglycemia/DKA on presentation (A1c 13.5) with severe dehydration and hypernatremia  Initially placed on insulin  drip, DKA resolved now on subcu insulin , erratic IV fluids changes overnight making it hard for a stable dose of insulin , requested staff not to change D5 drip overnight which is being given for hypernatremia.  Once off of D5 drip will come up with a stable regimen, diabetic and insulin  education.  Recent Labs    09/11/23 0751 09/11/23 0846 09/11/23 1028  GLUCAP <10* 180* 179*    Sepsis secondary to PNA (POA) Sepsis physiology has resolved Clinically improved Leukocytosis downtrending All cultures negative so far Completed 7 days of Zosyn  Remains on Zyvox -7 days planned. Completed a course of Zithromax  for atypical coverage.  AKI with hypernatremia Due to dehydration from DKA, AKI has resolved.  Hold nephrotoxic agents including  ACE ARB, continue D5 drip and oral free water intake for hypernatremia which has been significant and for multiple days  Acute metabolic encephalopathy Suspect secondary to DKA/AKI/sepsis/PNA Thankfully has improved after treatment of underlying etiologies-awake and alert this morning. CT head was negative.  Pancreatitis Possibly related to DKA CT abdomen negative for gallstones.  Triglycerides  were not elevated. Unclear if her abdominal pain on presentation. Abdomen currently soft-nontender on exam-tolerating initiation of diet.  Outpatient GI follow-up postdischarge  HTN BP stable without use of any antihypertensives Losartan remains on hold.  HLD Resume fenofibrate /pravastatin -in next several days.  Bronchial asthma Not in exacerbation Continue bronchodilators.  Code status:   Code Status: Full Code   DVT Prophylaxis: heparin  injection 5,000 Units Start: 09/05/23 1045 SCDs Start: 09/02/23 1132   Family Communication: Spouse at bedside on 09/11/2023   Disposition Plan: Status is: Inpatient Remains inpatient appropriate because: Severity of illness   Planned Discharge Destination:Home   Diet: Diet Order             Diet Carb Modified Fluid consistency: Thin; Room service appropriate? Yes  Diet effective now                    Data Review:   Inpatient Medications  Scheduled Meds:  arformoterol   15 mcg Nebulization BID   budesonide  (PULMICORT ) nebulizer solution  0.5 mg Nebulization BID   Chlorhexidine  Gluconate Cloth  6 each Topical Daily   feeding supplement  237 mL Oral BID BM   heparin  injection (subcutaneous)  5,000 Units Subcutaneous Q8H   insulin  aspart  0-9 Units Subcutaneous TID WC   insulin  glargine-yfgn  20 Units Subcutaneous BID   insulin  starter kit- pen needles  1 kit Other Once   mouth rinse  15 mL Mouth Rinse 4 times per day   pantoprazole   40 mg Oral Daily   Continuous Infusions:  dextrose  100 mL/hr at 09/11/23 1036   promethazine  (PHENERGAN ) injection (IM or IVPB)     PRN Meds:.acetaminophen  **OR** acetaminophen , antiseptic oral rinse, dextrose , hydrALAZINE , ipratropium-albuterol , lip balm, metoprolol  tartrate, ondansetron  (ZOFRAN ) IV, mouth rinse, polyethylene glycol, promethazine  (PHENERGAN ) injection (IM or IVPB)  DVT Prophylaxis  heparin  injection 5,000 Units Start: 09/05/23 1045 SCDs Start: 09/02/23  1132       Recent Labs  Lab 09/07/23 0442 09/08/23 0458 09/09/23 0616 09/10/23 0636 09/11/23 0613  WBC 22.4* 18.1* 17.0* 20.0* 21.0*  HGB 10.2* 9.5* 9.0* 9.2* 8.6*  HCT 33.4* 31.5* 30.1* 30.7* 28.8*  PLT 138* 150 180 220 221  MCV 94.9 96.0 98.0 96.5 97.3  MCH 29.0 29.0 29.3 28.9 29.1  MCHC 30.5 30.2 29.9* 30.0 29.9*  RDW 12.5 12.6 12.5 12.3 12.4  LYMPHSABS  --   --   --   --  2.3  MONOABS  --   --   --   --  0.8  EOSABS  --   --   --   --  0.6*  BASOSABS  --   --   --   --  0.0    Recent Labs  Lab 09/05/23 0331 09/05/23 0948 09/05/23 2201 09/06/23 0347 09/08/23 0458 09/09/23 0616 09/09/23 2210 09/10/23 0636 09/11/23 0613  NA 152*   < > 146*   < > 160* 160* 150* 157* 157*  K 3.8   < > 3.8   < > 4.0 3.8 3.9 3.5 3.7  CL 117*   < > 111   < > 123* 121* 117* 122* 123*  CO2  26   < > 25   < > 25 28 24 25 24   ANIONGAP 9   < > 10   < > 12 11 9 10 10   GLUCOSE 175*   < > 163*   < > 135* 80 366* 126* 60*  BUN 81*   < > 68*   < > 68* 46* 35* 30* 27*  CREATININE 3.25*   < > 3.17*   < > 2.33* 2.00* 1.72* 1.75* 1.65*  MG 3.3*  --  3.1*  --  3.2*  --   --   --  2.2  PHOS 2.4*  --  2.9  --  3.5  --   --   --  4.2  CALCIUM  8.2*   < > 7.8*   < > 8.5* 8.5* 8.5* 8.8* 8.8*   < > = values in this interval not displayed.      Recent Labs  Lab 09/05/23 0331 09/05/23 0948 09/05/23 2201 09/06/23 0347 09/08/23 0458 09/09/23 0616 09/09/23 2210 09/10/23 0636 09/11/23 0613  MG 3.3*  --  3.1*  --  3.2*  --   --   --  2.2  CALCIUM  8.2*   < > 7.8*   < > 8.5* 8.5* 8.5* 8.8* 8.8*   < > = values in this interval not displayed.    --------------------------------------------------------------------------------------------------------------- Lab Results  Component Value Date   TRIG 146 09/03/2023    Lab Results  Component Value Date   HGBA1C 13.5 (H) 09/02/2023   No results for input(s): TSH, T4TOTAL, FREET4, T3FREE, THYROIDAB in the last 72 hours. No results for  input(s): VITAMINB12, FOLATE, FERRITIN, TIBC, IRON, RETICCTPCT in the last 72 hours. ------------------------------------------------------------------------------------------------------------------ Cardiac Enzymes No results for input(s): CKMB, TROPONINI, MYOGLOBIN in the last 168 hours.  Invalid input(s): CK  Micro Results Recent Results (from the past 240 hours)  Blood culture (routine x 2)     Status: None   Collection Time: 09/02/23  9:10 AM   Specimen: BLOOD  Result Value Ref Range Status   Specimen Description BLOOD RIGHT ANTECUBITAL  Final   Special Requests   Final    BOTTLES DRAWN AEROBIC AND ANAEROBIC Blood Culture results may not be optimal due to an inadequate volume of blood received in culture bottles   Culture   Final    NO GROWTH 5 DAYS Performed at [facility], [address], [place], [place] [address]    Report Status 09/07/2023 FINAL  Final  Blood culture (routine x 2)     Status: None   Collection Time: 09/02/23  9:50 AM   Specimen: BLOOD LEFT HAND  Result Value Ref Range Status   Specimen Description BLOOD LEFT HAND  Final   Special Requests   Final    BOTTLES DRAWN AEROBIC AND ANAEROBIC Blood Culture adequate volume   Culture   Final    NO GROWTH 5 DAYS Performed at [facility], [address], [place], [place] [address]    Report Status 09/07/2023 FINAL  Final  MRSA Next Gen by PCR, Nasal     Status: None   Collection Time: 09/02/23 11:32 AM   Specimen: Nasal Mucosa; Nasal Swab  Result Value Ref Range Status   MRSA by PCR Next Gen NOT DETECTED NOT DETECTED Final    Comment: (NOTE) The GeneXpert MRSA Assay (FDA approved for NASAL specimens only), is one component of a comprehensive MRSA colonization surveillance program. It is not intended to diagnose MRSA infection  nor to guide or monitor treatment for MRSA infections. Test performance is not FDA approved in patients less than 27 years old. Performed at [facility], [address] [place], [place] [address]   Culture, blood (Routine X 2) w Reflex to ID Panel     Status: None   Collection Time: 09/04/23 11:42 PM   Specimen: BLOOD  Result Value Ref Range Status   Specimen Description BLOOD RIGHT ARM  Final   Special Requests   Final    BOTTLES DRAWN AEROBIC AND ANAEROBIC Blood Culture results may not be optimal due to an inadequate volume of blood received in culture bottles   Culture   Final    NO GROWTH 5 DAYS Performed at [facility], [address] [place], [place] [address]    Report Status 09/09/2023 FINAL  Final  Culture, blood (Routine X 2) w Reflex to ID Panel     Status: None   Collection Time: 09/04/23 11:45 PM   Specimen: BLOOD  Result Value Ref Range Status   Specimen Description BLOOD RIGHT HAND  Final   Special Requests   Final    BOTTLES DRAWN AEROBIC AND ANAEROBIC Blood Culture results may not be optimal due to an inadequate volume of blood received in culture bottles   Culture   Final    NO GROWTH 5 DAYS Performed at [facility], [address], [place], [place] [address]    Report Status 09/09/2023 FINAL  Final    Radiology Reports  No results found.    Signature  -   Lavada Stank M.D on 09/11/2023 at 10:57 AM   -  To page go to [email or web address]

## 2023-09-11 NOTE — Inpatient Diabetes Management (Signed)
 Inpatient Diabetes Program Recommendations  AACE/ADA: New Consensus Statement on Inpatient Glycemic Control (2015)  Target Ranges:  Prepandial:   less than 140 mg/dL      Peak postprandial:   less than 180 mg/dL (1-2 hours)      Critically ill patients:  140 - 180 mg/dL   Lab Results  Component Value Date   GLUCAP 180 (H) 09/11/2023   HGBA1C 13.5 (H) 09/02/2023    Review of Glycemic Control  Latest Reference Range & Units 09/10/23 07:56 09/10/23 09:02 09/10/23 11:12 09/10/23 16:04 09/10/23 20:28 09/11/23 07:51 09/11/23 08:46  Glucose-Capillary 70 - 99 mg/dL 36 (LL) 832 (H) 681 (H) 108 (H) 146 (H) <10 (LL) 180 (H)   Diabetes history: DM 2 recent diagnosis Outpatient Diabetes medications: Glucose 5 mg Daily, metformin 500 mg bid Current orders for Inpatient glycemic control:  Novolog  0-9 units tid Semglee  20 units bid  Inpatient Diabetes Program Recommendations:    Note: Glucose low again reading <10  -  reduce Semglee  again to 10 units bid and monitor on correction scale may need additional meal coverage  Discharge Recommendations: Long acting recommendations: Insulin  Glargine (LANTUS ) Solostar Pen Dose TBD at time of discharge by MD  Short acting recommendations:  Meal coverage ONLY Insulin  aspart (NOVOLOG ) FlexPen  Dose TBD at time of discharge by MD   Supply/Referral recommendations: Glucometer Test strips Lancet device Lancets Pen needles - standard Freestyle Libre 3 plus Glucose Sensors--please give 4 refills (Order number [phone]) + Freestyle Libre 3 Reader (Order number [id number])   Use Adult Diabetes Insulin  Treatment Post Discharge order set.  Thanks,  Clotilda Bull RN, MSN, BC-ADM Inpatient Diabetes Coordinator Team Pager [phone] (8a-5p)

## 2023-09-11 NOTE — Plan of Care (Signed)

## 2023-09-12 ENCOUNTER — Inpatient Hospital Stay (HOSPITAL_COMMUNITY)

## 2023-09-12 DIAGNOSIS — E1110 Type 2 diabetes mellitus with ketoacidosis without coma: Secondary | ICD-10-CM | POA: Diagnosis not present

## 2023-09-12 LAB — BASIC METABOLIC PANEL WITH GFR
Anion gap: 8 (ref 5–15)
BUN: 19 mg/dL (ref 8–23)
CO2: 25 mmol/L (ref 22–32)
Calcium: 8.9 mg/dL (ref 8.9–10.3)
Chloride: 119 mmol/L — ABNORMAL HIGH (ref 98–111)
Creatinine, Ser: 1.59 mg/dL — ABNORMAL HIGH (ref 0.61–1.24)
GFR, Estimated: 49 mL/min — ABNORMAL LOW (ref >60)
Glucose, Bld: 151 mg/dL — ABNORMAL HIGH (ref 70–99)
Potassium: 3.5 mmol/L (ref 3.5–5.1)
Sodium: 152 mmol/L — ABNORMAL HIGH (ref 135–145)

## 2023-09-12 LAB — GLUCOSE, CAPILLARY
Glucose-Capillary: 163 mg/dL — ABNORMAL HIGH (ref 70–99)
Glucose-Capillary: 148 mg/dL — ABNORMAL HIGH (ref 70–99)
Glucose-Capillary: 136 mg/dL — ABNORMAL HIGH (ref 70–99)
Glucose-Capillary: 131 mg/dL — ABNORMAL HIGH (ref 70–99)
Glucose-Capillary: 137 mg/dL — ABNORMAL HIGH (ref 70–99)
Glucose-Capillary: 98 mg/dL (ref 70–99)
Glucose-Capillary: 243 mg/dL — ABNORMAL HIGH (ref 70–99)

## 2023-09-12 LAB — CBC WITH DIFFERENTIAL/PLATELET
Abs Immature Granulocytes: 2.62 K/uL — ABNORMAL HIGH (ref 0.00–0.07)
Basophils Absolute: 0.1 K/uL (ref 0.0–0.1)
Basophils Relative: 1 %
Eosinophils Absolute: 0.4 K/uL (ref 0.0–0.5)
Eosinophils Relative: 2 %
HCT: 29.7 % — ABNORMAL LOW (ref 39.0–52.0)
Hemoglobin: 9 g/dL — ABNORMAL LOW (ref 13.0–17.0)
Immature Granulocytes: 11 %
Lymphocytes Relative: 12 %
Lymphs Abs: 2.8 K/uL (ref 0.7–4.0)
MCH: 29.3 pg (ref 26.0–34.0)
MCHC: 30.3 g/dL (ref 30.0–36.0)
MCV: 96.7 fL (ref 80.0–100.0)
Monocytes Absolute: 1.1 K/uL — ABNORMAL HIGH (ref 0.1–1.0)
Monocytes Relative: 5 %
Neutro Abs: 16.1 K/uL — ABNORMAL HIGH (ref 1.7–7.7)
Neutrophils Relative %: 69 %
Platelets: 254 K/uL (ref 150–400)
RBC: 3.07 MIL/uL — ABNORMAL LOW (ref 4.22–5.81)
RDW: 12.4 % (ref 11.5–15.5)
Smear Review: NORMAL
WBC: 23 K/uL — ABNORMAL HIGH (ref 4.0–10.5)
nRBC: 0.5 % — ABNORMAL HIGH (ref 0.0–0.2)

## 2023-09-12 LAB — PHOSPHORUS: Phosphorus: 3.7 mg/dL (ref 2.5–4.6)

## 2023-09-12 LAB — TECHNOLOGIST SMEAR REVIEW: Plt Morphology: NORMAL

## 2023-09-12 LAB — PROCALCITONIN: Procalcitonin: 4.29 ng/mL

## 2023-09-12 LAB — MAGNESIUM: Magnesium: 2.1 mg/dL (ref 1.7–2.4)

## 2023-09-12 LAB — C-REACTIVE PROTEIN: CRP: 0.7 mg/dL (ref <1.0)

## 2023-09-12 MED ORDER — LEVOFLOXACIN IN D5W 750 MG/150ML IV SOLN
750.0000 mg | Freq: Once | INTRAVENOUS | Status: AC
  Administered 2023-09-12: 750 mg via INTRAVENOUS
  Filled 2023-09-12: qty 150

## 2023-09-12 MED ORDER — DEXTROSE 5 % IV SOLN: INTRAVENOUS | Status: DC

## 2023-09-12 MED ORDER — INSULIN GLARGINE-YFGN 100 UNIT/ML ~~LOC~~ SOLN
15.0000 [IU] | Freq: Two times a day (BID) | SUBCUTANEOUS | Status: DC
  Administered 2023-09-12 (×2): 15 [IU] via SUBCUTANEOUS
  Filled 2023-09-12 (×3): qty 0.15

## 2023-09-12 MED ORDER — SODIUM CHLORIDE 0.9 % IV SOLN
100.0000 mg | Freq: Two times a day (BID) | INTRAVENOUS | Status: DC
  Administered 2023-09-12 – 2023-09-13 (×3): 100 mg via INTRAVENOUS
  Filled 2023-09-12 (×5): qty 100

## 2023-09-12 NOTE — Progress Notes (Signed)
 PROGRESS NOTE        PATIENT DETAILS Name: Jack Cunningham Age: 63 y.o. Sex: male Date of Birth: August 21, 1960 Admit Date: 09/02/2023 Admitting Physician Toribio JAYSON Sharps, MD ERE:Ejupzwu, No Pcp Per  Brief Summary: Patient is a 63 y.o.  male with history of DM-2 (recent diagnosis), HTN, asthma/OSA-who was brought to the hospital by EMS-after they were called by his spouse-when she heard a loud sound-and found the patient on the floor asking for help.  Apparently patient was recently diagnosed with DM-following several weeks of not feeling well and polydipsia.  In the ED-he was lethargic-upon further evaluation-he was found to have DKA, AKI and sepsis secondary to pneumonia/pancreatitis.  He was subsequently admitted to the ICU-stabilized and transferred to [facility] on 7/8.  Significant events: 7/3>> Admit to ICU-DKA 7/4>> remained on insulin  drip, hypernatremia - remained on D5W 7/5>> remained on insulin  drip and D5W due to hypernatremia, overnight spike fever, CT Chest/Abd/Pelv showing pneumonia. Started on zosyn  and linezolid . 7/6>> endotool stopped 7/8>> transferred to [facility] with  Significant studies: 7/3>> CXR: No PNA. 7/3>> x-ray pelvis: No fracture/dislocation. 7/3>> CT head: No acute intracranial abnormality 7/3>> CT C-spine: No acute intracranial abnormality. 7/4>> renal ultrasound: No hydronephrosis. 7/5>> CT chest/abdomen/pelvis: Lobar consolidation right lower lobe lung.  Significant microbiology data: 7/3>> blood culture: No growth 7/5>> blood culture: No growth  Procedures: None  Consults: PCCM.  Subjective: Patient in bed, appears comfortable, denies any headache, no fever, no chest pain or pressure, no shortness of breath , no abdominal pain. No focal weakness.  Objective: Vitals: Blood pressure (!) 114/58, pulse 84, temperature 98.4 F (36.9 C), temperature source Oral, resp. rate 18, height 5' 10 (1.778 m), weight 89.9 kg, SpO2 97%.    Exam:  Awake Alert, No new F.N deficits, Normal affect [place].AT,PERRAL Supple Neck, No JVD,   Symmetrical Chest wall movement, Good air movement bilaterally, CTAB RRR,No Gallops, Rubs or new Murmurs,  +ve B.Sounds, Abd Soft, No tenderness,   No Cyanosis, Clubbing or edema    Assessment/Plan:   DKA and newly diagnosed DM-2 with uncontrolled hyperglycemia/DKA on presentation (A1c 13.5) with severe dehydration and hypernatremia  Initially placed on insulin  drip, DKA resolved now on subcu insulin , erratic IV fluids changes overnight making it hard for a stable dose of insulin , requested staff not to change D5 drip overnight which is being given for hypernatremia.  Once off of D5 drip will come up with a stable regimen, diabetic and insulin  education.  Recent Labs    09/12/23 0321 09/12/23 0618 09/12/23 0804  GLUCAP 148* 136* 131*    Sepsis secondary to PNA (POA) Sepsis physiology has resolved Clinically improved Leukocytosis downtrending All cultures negative so far Completed 7 days of Zosyn  Remains on Zyvox -7 days planned. Completed a course of Zithromax  for atypical coverage.  AKI with hypernatremia Due to dehydration from DKA, AKI has resolved.  Improving with D5W which will be continued for another day or 09/12/2023, encouraged to drink plenty of water, continue to avoid nephrotoxins.  Leukocytosis.  Appears to be reactionary, afebrile, peripheral smear ordered, no labs in our records, wife will bring PCPs phone number tomorrow, none listed in our chart.  Remains afebrile with no dysuria, bladder scans stable, stable chest x-ray, completely symptom-free.  Monitor.    Acute metabolic encephalopathy Suspect secondary to DKA/AKI/sepsis/PNA Thankfully has improved after treatment of underlying etiologies-awake  and alert this morning. CT head was negative.  Pancreatitis Possibly related to DKA CT abdomen negative for gallstones.  Triglycerides were not elevated. Unclear if  her abdominal pain on presentation. Abdomen currently soft-nontender on exam-tolerating initiation of diet.  Outpatient GI follow-up postdischarge  HTN BP stable without use of any antihypertensives Losartan remains on hold.  HLD Resume fenofibrate /pravastatin -in next several days.  Bronchial asthma Not in exacerbation Continue bronchodilators.  Code status:   Code Status: Full Code   DVT Prophylaxis: heparin  injection 5,000 Units Start: 09/05/23 1045 SCDs Start: 09/02/23 1132   Family Communication: Spouse bedside on 09/11/2023, over the phone on 09/12/2023,   Disposition Plan: Status is: Inpatient Remains inpatient appropriate because: Severity of illness   Planned Discharge Destination:Home   Diet: Diet Order             Diet Carb Modified Fluid consistency: Thin; Room service appropriate? Yes  Diet effective now                    Data Review:   Inpatient Medications  Scheduled Meds:  arformoterol   15 mcg Nebulization BID   budesonide  (PULMICORT ) nebulizer solution  0.5 mg Nebulization BID   Chlorhexidine  Gluconate Cloth  6 each Topical Daily   feeding supplement  237 mL Oral BID BM   heparin  injection (subcutaneous)  5,000 Units Subcutaneous Q8H   insulin  aspart  0-9 Units Subcutaneous TID WC   insulin  glargine-yfgn  15 Units Subcutaneous BID   insulin  starter kit- pen needles  1 kit Other Once   mouth rinse  15 mL Mouth Rinse 4 times per day   pantoprazole   40 mg Oral Daily   Continuous Infusions:  dextrose      promethazine  (PHENERGAN ) injection (IM or IVPB)     PRN Meds:.acetaminophen  **OR** acetaminophen , antiseptic oral rinse, dextrose , hydrALAZINE , ipratropium-albuterol , lip balm, metoprolol  tartrate, ondansetron  (ZOFRAN ) IV, mouth rinse, polyethylene glycol, promethazine  (PHENERGAN ) injection (IM or IVPB)  DVT Prophylaxis  Recent Labs  Lab 09/08/23 0458 09/09/23 0616 09/10/23 0636 09/11/23 0613 09/12/23 0729  WBC 18.1* 17.0* 20.0*  21.0* 23.0*  HGB 9.5* 9.0* 9.2* 8.6* 9.0*  HCT 31.5* 30.1* 30.7* 28.8* 29.7*  PLT 150 180 220 221 254  MCV 96.0 98.0 96.5 97.3 96.7  MCH 29.0 29.3 28.9 29.1 29.3  MCHC 30.2 29.9* 30.0 29.9* 30.3  RDW 12.6 12.5 12.3 12.4 12.4  LYMPHSABS  --   --   --  2.3 2.8  MONOABS  --   --   --  0.8 1.1*  EOSABS  --   --   --  0.6* 0.4  BASOSABS  --   --   --  0.0 0.1    Recent Labs  Lab 09/05/23 2201 09/06/23 0347 09/08/23 0458 09/09/23 0616 09/09/23 2210 09/10/23 0636 09/11/23 0613 09/12/23 0729  NA 146*   < > 160* 160* 150* 157* 157* 152*  K 3.8   < > 4.0 3.8 3.9 3.5 3.7 3.5  CL 111   < > 123* 121* 117* 122* 123* 119*  CO2 25   < > 25 28 24 25 24 25   ANIONGAP 10   < > 12 11 9 10 10 8   GLUCOSE 163*   < > 135* 80 366* 126* 60* 151*  BUN 68*   < > 68* 46* 35* 30* 27* 19  CREATININE 3.17*   < > 2.33* 2.00* 1.72* 1.75* 1.65* 1.59*  CRP  --   --   --   --   --   --  1.0*  --   PROCALCITON  --   --   --   --   --   --  6.98  --   MG 3.1*  --  3.2*  --   --   --  2.2 2.1  PHOS 2.9  --  3.5  --   --   --  4.2 3.7  CALCIUM  7.8*   < > 8.5* 8.5* 8.5* 8.8* 8.8* 8.9   < > = values in this interval not displayed.      Recent Labs  Lab 09/05/23 2201 09/06/23 0347 09/08/23 0458 09/09/23 0616 09/09/23 2210 09/10/23 0636 09/11/23 0613 09/12/23 0729  CRP  --   --   --   --   --   --  1.0*  --   PROCALCITON  --   --   --   --   --   --  6.98  --   MG 3.1*  --  3.2*  --   --   --  2.2 2.1  CALCIUM  7.8*   < > 8.5* 8.5* 8.5* 8.8* 8.8* 8.9   < > = values in this interval not displayed.    --------------------------------------------------------------------------------------------------------------- Lab Results  Component Value Date   TRIG 146 09/03/2023    Lab Results  Component Value Date   HGBA1C 13.5 (H) 09/02/2023   No results for input(s): TSH, T4TOTAL, FREET4, T3FREE, THYROIDAB in the last 72 hours. No results for input(s): VITAMINB12, FOLATE, FERRITIN, TIBC,  IRON, RETICCTPCT in the last 72 hours. ------------------------------------------------------------------------------------------------------------------ Cardiac Enzymes No results for input(s): CKMB, TROPONINI, MYOGLOBIN in the last 168 hours.  Invalid input(s): CK  Micro Results Recent Results (from the past 240 hours)  MRSA Next Gen by PCR, Nasal     Status: None   Collection Time: 09/02/23 11:32 AM   Specimen: Nasal Mucosa; Nasal Swab  Result Value Ref Range Status   MRSA by PCR Next Gen NOT DETECTED NOT DETECTED Final    Comment: (NOTE) The GeneXpert MRSA Assay (FDA approved for NASAL specimens only), is one component of a comprehensive MRSA colonization surveillance program. It is not intended to diagnose MRSA infection nor to guide or monitor treatment for MRSA infections. Test performance is not FDA approved in patients less than 67 years old. Performed at [facility], [address] [place], [place] [address]   Culture, blood (Routine X 2) w Reflex to ID Panel     Status: None   Collection Time: 09/04/23 11:42 PM   Specimen: BLOOD  Result Value Ref Range Status   Specimen Description BLOOD RIGHT ARM  Final   Special Requests   Final    BOTTLES DRAWN AEROBIC AND ANAEROBIC Blood Culture results may not be optimal due to an inadequate volume of blood received in culture bottles   Culture   Final    NO GROWTH 5 DAYS Performed at [facility], [address], [place], [place] [address]    Report Status 09/09/2023 FINAL  Final  Culture, blood (Routine X 2) w Reflex to ID Panel     Status: None   Collection Time: 09/04/23 11:45 PM   Specimen: BLOOD  Result Value Ref Range Status   Specimen Description BLOOD RIGHT HAND  Final   Special Requests   Final    BOTTLES DRAWN AEROBIC AND ANAEROBIC Blood Culture results may not be optimal due to an inadequate volume of blood received in culture bottles   Culture  Final    NO GROWTH 5 DAYS Performed at  [facility], [address], [place], [place] [address]    Report Status 09/09/2023 FINAL  Final    Radiology Reports  DG Chest Port 1 View Result Date: 09/11/2023 CLINICAL DATA:  Shortness of breath. EXAM: PORTABLE CHEST 1 VIEW COMPARISON:  Radiograph and CT 09/04/2023 FINDINGS: Re-expansion of the right lower lobe with improved aeration. Mild residual ill-defined opacity. No new airspace disease. Stable heart size and mediastinal contours. No pulmonary edema, large pleural effusion or pneumothorax. Stable heart size and mediastinal contours. IMPRESSION: Re-expansion of the right lower lobe with improved aeration. Mild residual ill-defined opacity. Electronically Signed   By: Andrea Gasman M.D.   On: 09/11/2023 15:34      Signature  -   Lavada Stank M.D on 09/12/2023 at 10:33 AM   -  To page go to [email or web address]

## 2023-09-12 NOTE — Progress Notes (Signed)
 Mobility Specialist Progress Note:   09/12/23 1500  Mobility  Activity Ambulated with assistance in hallway  Level of Assistance Contact guard assist, steadying assist  Assistive Device None  Distance Ambulated (ft) 500 ft  Activity Response Tolerated well  Mobility Referral Yes  Mobility visit 1 Mobility  Mobility Specialist Start Time (ACUTE ONLY) 1500  Mobility Specialist Stop Time (ACUTE ONLY) 1515  Mobility Specialist Time Calculation (min) (ACUTE ONLY) 15 min   Pt agreeable to mobility session. Ambulated with no AD use. Pt c/o IV in L arm discomfort. RN notified. Left sitting EOB with all needs met.   Therisa Rana Mobility Specialist Please contact via SecureChat or  Rehab office at [phone]

## 2023-09-12 NOTE — Progress Notes (Signed)
   09/11/23 2207  Provider Notification  Provider Name/Title Lorie, DO  Date Provider Notified 09/11/23  Time Provider Notified 2207  Method of Notification Virtual Face-to-face  Notification Reason Critical Result  Test performed and critical result BG 47 and BG 136  Date Critical Result Received 09/11/23  Time Critical Result Received 2201  Provider response No new orders  Date of Provider Response 09/11/23  Time of Provider Response 2208

## 2023-09-12 NOTE — Plan of Care (Signed)

## 2023-09-13 ENCOUNTER — Inpatient Hospital Stay (HOSPITAL_COMMUNITY)

## 2023-09-13 DIAGNOSIS — M7989 Other specified soft tissue disorders: Secondary | ICD-10-CM

## 2023-09-13 DIAGNOSIS — E1110 Type 2 diabetes mellitus with ketoacidosis without coma: Secondary | ICD-10-CM | POA: Diagnosis not present

## 2023-09-13 LAB — BASIC METABOLIC PANEL WITH GFR
Anion gap: 9 (ref 5–15)
BUN: 19 mg/dL (ref 8–23)
CO2: 23 mmol/L (ref 22–32)
Calcium: 8.4 mg/dL — ABNORMAL LOW (ref 8.9–10.3)
Chloride: 113 mmol/L — ABNORMAL HIGH (ref 98–111)
Creatinine, Ser: 1.49 mg/dL — ABNORMAL HIGH (ref 0.61–1.24)
GFR, Estimated: 53 mL/min — ABNORMAL LOW (ref >60)
Glucose, Bld: 214 mg/dL — ABNORMAL HIGH (ref 70–99)
Potassium: 3.6 mmol/L (ref 3.5–5.1)
Sodium: 145 mmol/L (ref 135–145)

## 2023-09-13 LAB — CBC WITH DIFFERENTIAL/PLATELET
Abs Immature Granulocytes: 0 K/uL (ref 0.00–0.07)
Basophils Absolute: 0 K/uL (ref 0.0–0.1)
Basophils Relative: 0 %
Eosinophils Absolute: 0.2 K/uL (ref 0.0–0.5)
Eosinophils Relative: 1 %
HCT: 26 % — ABNORMAL LOW (ref 39.0–52.0)
Hemoglobin: 7.8 g/dL — ABNORMAL LOW (ref 13.0–17.0)
Lymphocytes Relative: 11 %
Lymphs Abs: 2.4 K/uL (ref 0.7–4.0)
MCH: 29.1 pg (ref 26.0–34.0)
MCHC: 30 g/dL (ref 30.0–36.0)
MCV: 97 fL (ref 80.0–100.0)
Monocytes Absolute: 0.2 K/uL (ref 0.1–1.0)
Monocytes Relative: 1 %
Neutro Abs: 19.3 K/uL — ABNORMAL HIGH (ref 1.7–7.7)
Neutrophils Relative %: 87 %
Platelets: 216 K/uL (ref 150–400)
RBC: 2.68 MIL/uL — ABNORMAL LOW (ref 4.22–5.81)
RDW: 12.1 % (ref 11.5–15.5)
WBC: 22.2 K/uL — ABNORMAL HIGH (ref 4.0–10.5)
nRBC: 0 /100{WBCs}
nRBC: 0.4 % — ABNORMAL HIGH (ref 0.0–0.2)

## 2023-09-13 LAB — GLUCOSE, CAPILLARY
Glucose-Capillary: 144 mg/dL — ABNORMAL HIGH (ref 70–99)
Glucose-Capillary: 203 mg/dL — ABNORMAL HIGH (ref 70–99)
Glucose-Capillary: 156 mg/dL — ABNORMAL HIGH (ref 70–99)

## 2023-09-13 LAB — PROCALCITONIN: Procalcitonin: 2.73 ng/mL

## 2023-09-13 LAB — MAGNESIUM: Magnesium: 1.9 mg/dL (ref 1.7–2.4)

## 2023-09-13 LAB — PHOSPHORUS: Phosphorus: 3.3 mg/dL (ref 2.5–4.6)

## 2023-09-13 LAB — C-REACTIVE PROTEIN: CRP: 0.6 mg/dL (ref <1.0)

## 2023-09-13 MED ORDER — ASPIRIN 81 MG PO CHEW
81.0000 mg | CHEWABLE_TABLET | Freq: Every day | ORAL | Status: DC
  Administered 2023-09-13 – 2023-09-16 (×4): 81 mg via ORAL
  Filled 2023-09-13 (×4): qty 1

## 2023-09-13 MED ORDER — LACTATED RINGERS IV SOLN: INTRAVENOUS | Status: DC

## 2023-09-13 MED ORDER — INSULIN GLARGINE-YFGN 100 UNIT/ML ~~LOC~~ SOLN
20.0000 [IU] | Freq: Every day | SUBCUTANEOUS | Status: DC
  Filled 2023-09-13: qty 0.2

## 2023-09-13 MED ORDER — INSULIN GLARGINE-YFGN 100 UNIT/ML ~~LOC~~ SOLN
15.0000 [IU] | Freq: Every day | SUBCUTANEOUS | Status: DC
  Administered 2023-09-13: 15 [IU] via SUBCUTANEOUS
  Filled 2023-09-13 (×2): qty 0.15

## 2023-09-13 MED ORDER — LEVOFLOXACIN IN D5W 750 MG/150ML IV SOLN
750.0000 mg | INTRAVENOUS | Status: DC
  Administered 2023-09-13: 750 mg via INTRAVENOUS
  Filled 2023-09-13 (×2): qty 150

## 2023-09-13 NOTE — Progress Notes (Signed)
 LUE venous exam completed. Shaheed Schmuck, RVT

## 2023-09-13 NOTE — Progress Notes (Signed)
 Occupational Therapy Treatment Patient Details Name: Jack Cunningham MRN: [id number] DOB: June 19, 1960 Today's Date: 09/13/2023   History of present illness 63 y/o male presenting to ED after being found on the floor and progressively unresponsive, GCS 3-4 with EMS. More responsive in ED,but hypothermic with glucose >1200 Admitted for DKA PMH of recent dx of DM, HTN, asthma, OSA   OT comments  Pt making steady progress towards OT goals this session. Pt greeted seated in recliner eager for session. Pt donned pants from recliner with CGA but needed MOD cues for sequencing task. Pt completed sit>stand with CGA with pt holding onto IV pole. Pt reported dizziness that did not subside therefore suggested pt pivot to EOB to rest. During pivot pt become unresponsive and required MAX A to sit back safely on bed. Pt recounts feeling very dizzy but didn't blackout. Nurse then entered to assess pt further. Will continue to follow pt acutely for OT needs.   BP from EOB after syncope- 98/64( 76) HR 99, BP from supine- 111/61( 75)      If plan is discharge home, recommend the following:  Assistance with cooking/housework;Direct supervision/assist for medications management;Direct supervision/assist for financial management;Assist for transportation;Supervision due to cognitive status   Equipment Recommendations  None recommended by OT    Recommendations for Other Services      Precautions / Restrictions Precautions Precautions: Fall Recall of Precautions/Restrictions: Intact Restrictions Weight Bearing Restrictions Per Provider Order: No       Mobility Bed Mobility               General bed mobility comments: Received in recliner    Transfers Overall transfer level: Needs assistance Equipment used: None Transfers: Sit to/from Stand Sit to Stand: Contact guard assist Stand pivot transfers: Max assist         General transfer comment: pt stood from recliner with CGA to pull pants  to waist line.pt reports mild dizziness that was not improving therefore cued pt to pivot to EOB to sit however pt with syncope episode needing MAX effort to allow pt to sit back on bed     Balance Overall balance assessment: Mild deficits observed, not formally tested                                         ADL either performed or assessed with clinical judgement   ADL Overall ADL's : Needs assistance/impaired             Lower Body Bathing: Contact guard assist;Sit to/from stand Lower Body Bathing Details (indicate cue type and reason): simulated via LB dressing     Lower Body Dressing: Contact guard assist;Sit to/from stand Lower Body Dressing Details (indicate cue type and reason): pt donned underwear from recliner, pt needed initial MOD cues to sequence/initiate donning LB clothing d/t lines being in pts way Toilet Transfer: Contact guard assist;Maximal assistance Toilet Transfer Details (indicate cue type and reason): pt initially completed sit>stand from recliner with no AD and CGA, pt began to initiate walking but d/t syncope episode provided MAX A to allow pt to sit back down on bed         Functional mobility during ADLs: Contact guard assist General ADL Comments: ADL participation limited by syncope this session, noted cognitive deficits    Extremity/Trunk Assessment Upper Extremity Assessment Upper Extremity Assessment: Overall WFL for tasks assessed;Right hand dominant   Lower  Extremity Assessment Lower Extremity Assessment: Defer to PT evaluation   Cervical / Trunk Assessment Cervical / Trunk Assessment: Normal    Vision Baseline Vision/History: 0 No visual deficits     Perception Perception Perception: Within Functional Limits   Praxis Praxis Praxis: WFL   Communication Communication Communication: No apparent difficulties   Cognition Arousal: Alert Behavior During Therapy: WFL for tasks assessed/performed Cognition: Cognition  impaired     Awareness: Intellectual awareness intact, Online awareness impaired 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, clinical functioning impairment (select all impairments): Organization, Sequencing OT - Cognition Comments: pt needed initial MOD cues to sequence donning pants when lines were in his way                 Following commands: Impaired Following commands impaired: Only follows one step commands consistently, Follows multi-step commands with increased time      Cueing   Cueing Techniques: Verbal cues  Exercises      Shoulder Instructions       General Comments BP from EOB after syncope- 98/64( 76) HR 99, BP from supine- 111/61( 75)    Pertinent Vitals/ Pain       Pain Assessment Pain Assessment: No/denies pain  Home Living                                          Prior Functioning/Environment              Frequency  Min 2X/week        Progress Toward Goals  OT Goals(current goals can now be found in the care plan section)  Progress towards OT goals: Progressing toward goals  Acute Rehab OT Goals Patient Stated Goal: to go home OT Goal Formulation: With patient Time For Goal Achievement: 09/20/23 Potential to Achieve Goals: Fair  Plan      Co-evaluation                 AM-PAC OT 6 Clicks Daily Activity     Outcome Measure   Help from another person eating meals?: None Help from another person taking care of personal grooming?: A Little Help from another person toileting, which includes using toliet, bedpan, or urinal?: A Little Help from another person bathing (including washing, rinsing, drying)?: A Little Help from another person to put on and taking off regular upper body clothing?: None Help from another person to put on and taking off regular lower body clothing?: A Little 6 Click Score: 20    End of Session Equipment Utilized During Treatment: Gait belt  OT Visit  Diagnosis: Other symptoms and signs involving cognitive function;Muscle weakness (generalized) (M62.81)   Activity Tolerance Treatment limited secondary to medical complications (Comment);Other (comment) (syncope episode)   Patient Left in bed;with call bell/phone within reach;with bed alarm set;with nursing/sitter in room   Nurse Communication Mobility status;Other (comment) (nurse present at end of session to assess pt)        Time: 1405-1430 OT Time Calculation (min): 25 min  Charges: OT General Charges $OT Visit: 1 Visit OT Treatments $Self Care/Home Management : 23-37 mins  Ronal Mallie POUR., COTA/L Acute Rehabilitation Services [phone]   Ronal Mallie Needy 09/13/2023, 3:17 PM

## 2023-09-13 NOTE — Progress Notes (Addendum)
 PROGRESS NOTE        PATIENT DETAILS Name: Jack Cunningham Age: 63 y.o. Sex: male Date of Birth: 12-23-1960 Admit Date: 09/02/2023 Admitting Physician Toribio JAYSON Sharps, MD ERE:[id number], No Pcp Per  Brief Summary: Patient is a 63 y.o.  male with history of DM-2 (recent diagnosis), HTN, asthma/OSA-who was brought to the hospital by EMS-after they were called by his spouse-when she heard a loud sound-and found the patient on the floor asking for help.  Apparently patient was recently diagnosed with DM-following several weeks of not feeling well and polydipsia.  In the ED-he was lethargic-upon further evaluation-he was found to have DKA, AKI and sepsis secondary to pneumonia/pancreatitis.  He was subsequently admitted to the ICU-stabilized and transferred to [facility] on 7/8.  Significant events: 7/3>> Admit to ICU-DKA 7/4>> remained on insulin  drip, hypernatremia - remained on D5W 7/5>> remained on insulin  drip and D5W due to hypernatremia, overnight spike fever, CT Chest/Abd/Pelv showing pneumonia. Started on zosyn  and linezolid . 7/6>> endotool stopped 7/8>> transferred to [facility] with  Significant studies: 7/3>> CXR: No PNA. 7/3>> x-ray pelvis: No fracture/dislocation. 7/3>> CT head: No acute intracranial abnormality 7/3>> CT C-spine: No acute intracranial abnormality. 7/4>> renal ultrasound: No hydronephrosis. 7/5>> CT chest/abdomen/pelvis: Lobar consolidation right lower lobe lung.  Significant microbiology data: 7/3>> blood culture: No growth 7/5>> blood culture: No growth  Procedures: None  Consults: PCCM.  Subjective:  Patient in bed, appears comfortable, denies any headache, no fever, no chest pain or pressure, no shortness of breath , no abdominal pain. No new focal weakness.  Objective: Vitals: Blood pressure (!) 106/54, pulse 82, temperature 98.6 F (37 C), temperature source Oral, resp. rate 16, height 5' 10 (1.778 m), weight 93.8 kg, SpO2 96%.    Exam:  Awake Alert, No new F.N deficits, Normal affect [place].AT,PERRAL Supple Neck, No JVD,   Symmetrical Chest wall movement, Good air movement bilaterally, CTAB RRR,No Gallops, Rubs or new Murmurs,  +ve B.Sounds, Abd Soft, No tenderness,   Swelling in the left arm around the previous IV site.   Assessment/Plan:   DKA and newly diagnosed DM-2 with uncontrolled hyperglycemia/DKA on presentation (A1c 13.5) with severe dehydration and hypernatremia  Initially admitted D5 drip and hydration, now switched to subcu long-acting insulin  and sliding scale, education provided, monitor and adjust as needed.  Recent Labs    09/12/23 1634 09/12/23 2113 09/13/23 0804  GLUCAP 98 243* 144*    Sepsis secondary to PNA (POA) Sepsis physiology has resolved Lab significant leukocytosis, CT suggestive of possible ongoing bronchitis, previously had been treated with 7 days of Zosyn  and Zyvox , rechallenge with Levaquin  and doxycycline  and monitor.     AKI with hypernatremia Due to dehydration from DKA, AKI has resolved.  Improving with D5W which will be continued for another day or 09/12/2023, encouraged to drink plenty of water, continue to avoid nephrotoxins.  Leukocytosis.  This is acute and reactive, discussed with PCP office on 09/13/2023, 1 month ago had normal creatinine and normal WBC count.  Acute metabolic encephalopathy Suspect secondary to DKA/AKI/sepsis/PNA CT negative.  Resolved.  Pancreatitis Possibly related to DKA CT abdomen negative for gallstones.  Triglycerides were not elevated. Unclear if her abdominal pain on presentation. Abdomen currently soft-nontender on exam-tolerating initiation of diet.  Outpatient GI follow-up postdischarge  Left arm swelling.  Around the IV site, IV removed, check venous ultrasound.  HTN BP stable without use of any antihypertensives Losartan remains on hold.  HLD Resume fenofibrate /pravastatin -in next several days.  Bronchial  asthma Not in exacerbation Continue bronchodilators.  Code status:   Code Status: Full Code   DVT Prophylaxis: heparin  injection 5,000 Units Start: 09/05/23 1045 SCDs Start: 09/02/23 1132   Family Communication: Spouse bedside on 09/11/2023, over the phone on 09/12/2023, wife bedside 09/13/2023   Disposition Plan: Status is: Inpatient Remains inpatient appropriate because: Severity of illness   Planned Discharge Destination:Home   Diet: Diet Order             Diet Carb Modified Fluid consistency: Thin; Room service appropriate? Yes  Diet effective now                    Data Review:   Inpatient Medications  Scheduled Meds:  arformoterol   15 mcg Nebulization BID   budesonide  (PULMICORT ) nebulizer solution  0.5 mg Nebulization BID   Chlorhexidine  Gluconate Cloth  6 each Topical Daily   feeding supplement  237 mL Oral BID BM   heparin  injection (subcutaneous)  5,000 Units Subcutaneous Q8H   insulin  aspart  0-9 Units Subcutaneous TID WC   insulin  glargine-yfgn  15 Units Subcutaneous Daily   insulin  starter kit- pen needles  1 kit Other Once   mouth rinse  15 mL Mouth Rinse 4 times per day   pantoprazole   40 mg Oral Daily   Continuous Infusions:  doxycycline  (VIBRAMYCIN ) IV 100 mg (09/12/23 2324)   levofloxacin  (LEVAQUIN ) IV     promethazine  (PHENERGAN ) injection (IM or IVPB)     PRN Meds:.acetaminophen  **OR** acetaminophen , antiseptic oral rinse, dextrose , hydrALAZINE , ipratropium-albuterol , lip balm, metoprolol  tartrate, ondansetron  (ZOFRAN ) IV, mouth rinse, polyethylene glycol, promethazine  (PHENERGAN ) injection (IM or IVPB)  DVT Prophylaxis  Recent Labs  Lab 09/09/23 0616 09/10/23 0636 09/11/23 0613 09/12/23 0729 09/12/23 2343  WBC 17.0* 20.0* 21.0* 23.0* 22.2*  HGB 9.0* 9.2* 8.6* 9.0* 7.8*  HCT 30.1* 30.7* 28.8* 29.7* 26.0*  PLT 180 220 221 254 216  MCV 98.0 96.5 97.3 96.7 97.0  MCH 29.3 28.9 29.1 29.3 29.1  MCHC 29.9* 30.0 29.9* 30.3 30.0  RDW  12.5 12.3 12.4 12.4 12.1  LYMPHSABS  --   --  2.3 2.8 2.4  MONOABS  --   --  0.8 1.1* 0.2  EOSABS  --   --  0.6* 0.4 0.2  BASOSABS  --   --  0.0 0.1 0.0    Recent Labs  Lab 09/08/23 0458 09/09/23 0616 09/09/23 2210 09/10/23 0636 09/11/23 0613 09/12/23 0729 09/12/23 2343  NA 160*   < > 150* 157* 157* 152* 145  K 4.0   < > 3.9 3.5 3.7 3.5 3.6  CL 123*   < > 117* 122* 123* 119* 113*  CO2 25   < > 24 25 24 25 23   ANIONGAP 12   < > 9 10 10 8 9   GLUCOSE 135*   < > 366* 126* 60* 151* 214*  BUN 68*   < > 35* 30* 27* 19 19  CREATININE 2.33*   < > 1.72* 1.75* 1.65* 1.59* 1.49*  CRP  --   --   --   --  1.0* 0.7 0.6  PROCALCITON  --   --   --   --  6.98 4.29 2.73  MG 3.2*  --   --   --  2.2 2.1 1.9  PHOS 3.5  --   --   --  4.2 3.7 3.3  CALCIUM  8.5*   < > 8.5* 8.8* 8.8* 8.9 8.4*   < > = values in this interval not displayed.      Recent Labs  Lab 09/08/23 0458 09/09/23 0616 09/09/23 2210 09/10/23 0636 09/11/23 9386 09/12/23 0729 09/12/23 2343  CRP  --   --   --   --  1.0* 0.7 0.6  PROCALCITON  --   --   --   --  6.98 4.29 2.73  MG 3.2*  --   --   --  2.2 2.1 1.9  CALCIUM  8.5*   < > 8.5* 8.8* 8.8* 8.9 8.4*   < > = values in this interval not displayed.    --------------------------------------------------------------------------------------------------------------- Lab Results  Component Value Date   TRIG 146 09/03/2023    Lab Results  Component Value Date   HGBA1C 13.5 (H) 09/02/2023   No results for input(s): TSH, T4TOTAL, FREET4, T3FREE, THYROIDAB in the last 72 hours. No results for input(s): VITAMINB12, FOLATE, FERRITIN, TIBC, IRON, RETICCTPCT in the last 72 hours. ------------------------------------------------------------------------------------------------------------------ Cardiac Enzymes No results for input(s): CKMB, TROPONINI, MYOGLOBIN in the last 168 hours.  Invalid input(s): CK  Micro Results Recent Results (from the  past 240 hours)  Culture, blood (Routine X 2) w Reflex to ID Panel     Status: None   Collection Time: 09/04/23 11:42 PM   Specimen: BLOOD  Result Value Ref Range Status   Specimen Description BLOOD RIGHT ARM  Final   Special Requests   Final    BOTTLES DRAWN AEROBIC AND ANAEROBIC Blood Culture results may not be optimal due to an inadequate volume of blood received in culture bottles   Culture   Final    NO GROWTH 5 DAYS Performed at [facility], [address] [place], [place] [address]    Report Status 09/09/2023 FINAL  Final  Culture, blood (Routine X 2) w Reflex to ID Panel     Status: None   Collection Time: 09/04/23 11:45 PM   Specimen: BLOOD  Result Value Ref Range Status   Specimen Description BLOOD RIGHT HAND  Final   Special Requests   Final    BOTTLES DRAWN AEROBIC AND ANAEROBIC Blood Culture results may not be optimal due to an inadequate volume of blood received in culture bottles   Culture   Final    NO GROWTH 5 DAYS Performed at [facility], [address] [place], [place] [address]    Report Status 09/09/2023 FINAL  Final  Culture, blood (Routine X 2) w Reflex to ID Panel     Status: None (Preliminary result)   Collection Time: 09/12/23  6:14 PM   Specimen: BLOOD  Result Value Ref Range Status   Specimen Description BLOOD BLOOD RIGHT ARM  Final   Special Requests   Final    BOTTLES DRAWN AEROBIC AND ANAEROBIC Blood Culture results may not be optimal due to an inadequate volume of blood received in culture bottles   Culture   Final    NO GROWTH < 12 HOURS Performed at [facility], [address], [place], [place] [address]    Report Status PENDING  Incomplete  Culture, blood (Routine X 2) w Reflex to ID Panel     Status: None (Preliminary result)   Collection Time: 09/12/23 11:43 PM   Specimen: BLOOD  Result Value Ref Range Status   Specimen Description BLOOD RIGHT HAND  Final   Special Requests  Final    BOTTLES DRAWN AEROBIC AND  ANAEROBIC Blood Culture results may not be optimal due to an inadequate volume of blood received in culture bottles   Culture   Final    NO GROWTH < 12 HOURS Performed at [facility], [address], [place], [place] [address]    Report Status PENDING  Incomplete    Radiology Reports  CT CHEST ABDOMEN PELVIS WO CONTRAST Result Date: 09/12/2023 CLINICAL DATA:  Sepsis EXAM: CT CHEST, ABDOMEN AND PELVIS WITHOUT CONTRAST TECHNIQUE: Multidetector CT imaging of the chest, abdomen and pelvis was performed following the standard protocol without IV contrast. RADIATION DOSE REDUCTION: This exam was performed according to the departmental dose-optimization program which includes automated exposure control, adjustment of the mA and/or kV according to patient size and/or use of iterative reconstruction technique. COMPARISON:  09/04/2023 FINDINGS: CT CHEST FINDINGS Cardiovascular: Atherosclerotic calcification of the aortic arch and left anterior descending coronary artery. Mediastinum/Nodes: Unremarkable Lungs/Pleura: Biapical pleuroparenchymal scarring. Airway thickening is present, suggesting bronchitis or reactive airways disease. The previous consolidation of volume loss in the right lower lobe has substantially improved. There is currently extensive tree-in-bud nodularity in the peribronchovascular region of the right lower lobe favoring atypical infectious bronchiolitis. Lesser similar findings in the right middle lobe. Musculoskeletal: Degenerative right sternoclavicular arthropathy. Mild thoracic spondylosis. CT ABDOMEN PELVIS FINDINGS Hepatobiliary: Unremarkable unremarkable tech contracted gallbladder. Otherwise unremarkable. Pancreas: Unremarkable Spleen: Unremarkable Adrenals/Urinary Tract: Right posterior bladder diverticulum. Otherwise unremarkable. Stomach/Bowel: Unremarkable Vascular/Lymphatic: Atherosclerosis is present, including aortoiliac atherosclerotic disease. Reproductive: Unremarkable  Other: No supplemental non-categorized findings. Musculoskeletal: Unremarkable IMPRESSION: 1. Improved aeration in the right lower lobe, currently with substantial tree-in-bud nodularity in the peribronchovascular region of the right lower lobe favoring atypical infectious bronchiolitis. Lesser similar findings in the right middle lobe. 2. Airway thickening is present, suggesting bronchitis or reactive airways disease. 3. Right posterior bladder diverticulum. 4.  Aortic Atherosclerosis (ICD10-I70.0). Electronically Signed   By: Ryan Salvage M.D.   On: 09/12/2023 16:35   DG Chest Port 1 View Result Date: 09/11/2023 CLINICAL DATA:  Shortness of breath. EXAM: PORTABLE CHEST 1 VIEW COMPARISON:  Radiograph and CT 09/04/2023 FINDINGS: Re-expansion of the right lower lobe with improved aeration. Mild residual ill-defined opacity. No new airspace disease. Stable heart size and mediastinal contours. No pulmonary edema, large pleural effusion or pneumothorax. Stable heart size and mediastinal contours. IMPRESSION: Re-expansion of the right lower lobe with improved aeration. Mild residual ill-defined opacity. Electronically Signed   By: Andrea Gasman M.D.   On: 09/11/2023 15:34      Signature  -   Lavada Stank M.D on 09/13/2023 at 9:26 AM   -  To page go to [email or web address]

## 2023-09-13 NOTE — TOC Progression Note (Signed)
 Transition of Care [facility]) - Progression Note    Patient Details  Name: Devinn Hurwitz MRN: [id number] Date of Birth: December 25, 1960  [facility] CM/SW Contact  Robynn Eileen Hoose, RN Phone Number: 09/13/2023, 11:51 AM  Clinical Narrative:   Progression rounds update: Possible discharge home in 1-2 days. Per chart review, outpatient PT referral sent by previous RN Case Manager.    Expected Discharge Plan: Home/Self Care Barriers to Discharge: No Barriers Identified  Expected Discharge Plan and Services     Post Acute Care Choice: Durable Medical Equipment, Home Health                   DME Arranged: Walker rolling DME Agency: [employer] Date DME Agency Contacted: 09/08/23 Time DME Agency Contacted: 1226 Representative spoke with at DME Agency: Thomasina             Social Determinants of Health (SDOH) Interventions SDOH Screenings   Food Insecurity: No Food Insecurity (09/03/2023)  Housing: Low Risk  (09/03/2023)  Transportation Needs: No Transportation Needs (09/03/2023)  Utilities: Not At Risk (09/03/2023)    Readmission Risk Interventions     No data to display

## 2023-09-13 NOTE — Inpatient Diabetes Management (Signed)
 Inpatient Diabetes Program Recommendations  AACE/ADA: New Consensus Statement on Inpatient Glycemic Control (2015)  Target Ranges:  Prepandial:   less than 140 mg/dL      Peak postprandial:   less than 180 mg/dL (1-2 hours)      Critically ill patients:  140 - 180 mg/dL   Lab Results  Component Value Date   GLUCAP 156 (H) 09/13/2023   HGBA1C 13.5 (H) 09/02/2023    Review of Glycemic Control  Diabetes history: DM2, recent diagnosis Outpatient Diabetes medications: metformin 500 BID, glipizide 5 daily Current orders for Inpatient glycemic control: Semglee  15 daily, Novolog  0-9 TID  Spoke with pt at bedside regarding going home on insulin  and CGM. Pt states he has no questions regarding pen. Discussed CGM and explained we will give him a sample Libre sensor and he will get prescription for both sensor and reader at discharge.  Inpatient Diabetes Program Recommendations:    Discharge Recommendations: Long acting recommendations: Insulin  Glargine (LANTUS ) Solostar Pen Dose TBD at time of discharge by MD  Short acting recommendations:  Meal coverage ONLY Insulin  aspart (NOVOLOG ) FlexPen  Dose TBD at time of discharge by MD   Supply/Referral recommendations: Glucometer Test strips Lancet device Lancets Pen needles - standard Freestyle Libre 3 plus Glucose Sensors--please give 4 refills (Order number [id number]) + Freestyle Libre 3 Reader (Order number [id number])   Use Adult Diabetes Insulin  Treatment Post Discharge order set.  Thank you. Shona Brandy, RD, LDN, CDCES Inpatient Diabetes Coordinator [phone]

## 2023-09-13 NOTE — Plan of Care (Signed)

## 2023-09-13 NOTE — Progress Notes (Signed)
 LUE venous exam is completed. Trudy Kory, RVT

## 2023-09-13 NOTE — Discharge Instructions (Signed)
 Follow with Primary MD in 7 days   Get CBC, CMP, 2 view Chest X ray -  checked next visit with your primary MD   Activity: As tolerated with Full fall precautions use walker/cane & assistance as needed  Disposition Home    Diet: Heart Healthy Low Carb  Accuchecks 4 times/day, Once in AM empty stomach and then before each meal. Log in all results and show them to your Prim.MD in 3 days. If any glucose reading is under 80 or above 300 call your Prim MD immidiately. Follow Low glucose instructions for glucose under 80 as instructed.   Special Instructions: If you have smoked or chewed Tobacco  in the last 2 yrs please stop smoking, stop any regular Alcohol  and or any Recreational drug use.  On your next visit with your primary care physician please Get Medicines reviewed and adjusted.  Please request your Prim.MD to go over all Hospital Tests and Procedure/Radiological results at the follow up, please get all Hospital records sent to your Prim MD by signing hospital release before you go home.  If you experience worsening of your admission symptoms, develop shortness of breath, life threatening emergency, suicidal or homicidal thoughts you must seek medical attention immediately by calling 911 or calling your MD immediately  if symptoms less severe.  You Must read complete instructions/literature along with all the possible adverse reactions/side effects for all the Medicines you take and that have been prescribed to you. Take any new Medicines after you have completely understood and accpet all the possible adverse reactions/side effects.   Do not drive when taking Pain medications.  Do not take more than prescribed Pain, Sleep and Anxiety Medications  Wear Seat belts while driving.

## 2023-09-14 DIAGNOSIS — E1110 Type 2 diabetes mellitus with ketoacidosis without coma: Secondary | ICD-10-CM | POA: Diagnosis not present

## 2023-09-14 LAB — CBC WITH DIFFERENTIAL/PLATELET
Abs Immature Granulocytes: 1.27 K/uL — ABNORMAL HIGH (ref 0.00–0.07)
Basophils Absolute: 0.1 K/uL (ref 0.0–0.1)
Basophils Relative: 1 %
Eosinophils Absolute: 0.2 K/uL (ref 0.0–0.5)
Eosinophils Relative: 1 %
HCT: 25.2 % — ABNORMAL LOW (ref 39.0–52.0)
Hemoglobin: 7.5 g/dL — ABNORMAL LOW (ref 13.0–17.0)
Immature Granulocytes: 8 %
Lymphocytes Relative: 12 %
Lymphs Abs: 1.9 K/uL (ref 0.7–4.0)
MCH: 29.1 pg (ref 26.0–34.0)
MCHC: 29.8 g/dL — ABNORMAL LOW (ref 30.0–36.0)
MCV: 97.7 fL (ref 80.0–100.0)
Monocytes Absolute: 0.7 K/uL (ref 0.1–1.0)
Monocytes Relative: 4 %
Neutro Abs: 11.3 K/uL — ABNORMAL HIGH (ref 1.7–7.7)
Neutrophils Relative %: 74 %
Platelets: 197 K/uL (ref 150–400)
RBC: 2.58 MIL/uL — ABNORMAL LOW (ref 4.22–5.81)
RDW: 12.4 % (ref 11.5–15.5)
Smear Review: NORMAL
WBC: 15.4 K/uL — ABNORMAL HIGH (ref 4.0–10.5)
nRBC: 0.5 % — ABNORMAL HIGH (ref 0.0–0.2)

## 2023-09-14 LAB — OSMOLALITY, URINE: Osmolality, Ur: 404 mosm/kg (ref 300–900)

## 2023-09-14 LAB — SODIUM, URINE, RANDOM: Sodium, Ur: 107 mmol/L

## 2023-09-14 LAB — PROCALCITONIN: Procalcitonin: 1.02 ng/mL

## 2023-09-14 LAB — BASIC METABOLIC PANEL WITH GFR
Anion gap: 8 (ref 5–15)
BUN: 17 mg/dL (ref 8–23)
CO2: 24 mmol/L (ref 22–32)
Calcium: 8.6 mg/dL — ABNORMAL LOW (ref 8.9–10.3)
Chloride: 115 mmol/L — ABNORMAL HIGH (ref 98–111)
Creatinine, Ser: 1.48 mg/dL — ABNORMAL HIGH (ref 0.61–1.24)
GFR, Estimated: 53 mL/min — ABNORMAL LOW (ref >60)
Glucose, Bld: 164 mg/dL — ABNORMAL HIGH (ref 70–99)
Potassium: 3.8 mmol/L (ref 3.5–5.1)
Sodium: 147 mmol/L — ABNORMAL HIGH (ref 135–145)

## 2023-09-14 LAB — URIC ACID: Uric Acid, Serum: 5.2 mg/dL (ref 3.7–8.6)

## 2023-09-14 LAB — MAGNESIUM: Magnesium: 1.9 mg/dL (ref 1.7–2.4)

## 2023-09-14 LAB — GLUCOSE, CAPILLARY
Glucose-Capillary: 156 mg/dL — ABNORMAL HIGH (ref 70–99)
Glucose-Capillary: 138 mg/dL — ABNORMAL HIGH (ref 70–99)
Glucose-Capillary: 109 mg/dL — ABNORMAL HIGH (ref 70–99)
Glucose-Capillary: 162 mg/dL — ABNORMAL HIGH (ref 70–99)

## 2023-09-14 LAB — PHOSPHORUS: Phosphorus: 3.3 mg/dL (ref 2.5–4.6)

## 2023-09-14 LAB — C-REACTIVE PROTEIN: CRP: 0.6 mg/dL (ref <1.0)

## 2023-09-14 LAB — CREATININE, URINE, RANDOM: Creatinine, Urine: 70 mg/dL

## 2023-09-14 LAB — OSMOLALITY: Osmolality: 317 mosm/kg — ABNORMAL HIGH (ref 275–295)

## 2023-09-14 MED ORDER — LEVOFLOXACIN 750 MG PO TABS
750.0000 mg | ORAL_TABLET | Freq: Every day | ORAL | Status: AC
  Administered 2023-09-14 – 2023-09-16 (×3): 750 mg via ORAL
  Filled 2023-09-14 (×4): qty 1

## 2023-09-14 MED ORDER — DOXYCYCLINE HYCLATE 100 MG PO TABS
100.0000 mg | ORAL_TABLET | Freq: Two times a day (BID) | ORAL | Status: DC
  Administered 2023-09-14 – 2023-09-16 (×4): 100 mg via ORAL
  Filled 2023-09-14 (×4): qty 1

## 2023-09-14 MED ORDER — SODIUM CHLORIDE 0.45 % IV SOLN: INTRAVENOUS | Status: AC

## 2023-09-14 MED ORDER — INSULIN GLARGINE-YFGN 100 UNIT/ML ~~LOC~~ SOLN
20.0000 [IU] | Freq: Every day | SUBCUTANEOUS | Status: DC
  Administered 2023-09-14 – 2023-09-16 (×3): 20 [IU] via SUBCUTANEOUS
  Filled 2023-09-14 (×4): qty 0.2

## 2023-09-14 NOTE — Progress Notes (Signed)
 Occupational Therapy Treatment Patient Details Name: Jack Cunningham MRN: [id number] DOB: [birth year] Today's Date: 09/14/2023   History of present illness 63 y/o male presenting to ED after being found on the floor and progressively unresponsive, GCS 3-4 with EMS. More responsive in ED,but hypothermic with glucose >1200 Admitted for DKA PMH of recent dx of DM, HTN, asthma, OSA   OT comments  Pt with incremental progress towards goals though remains limited by orthostatic hypotension and cognitive deficits. Pt able to manage basic ADLs standing at sink and mobility in room w/o AD w/o physical assistance. After activity, standing statically for last BP reading, pt noted with pre-syncope that resolved once seated in recliner with legs elevated. Administered Short Blessed Test during session to further assess functional cognition with score of 17 (0-4 = normal cognition) demonstrating continued significant deficits in cognition from baseline as pt normally independent and working at [employer] center.   BP supine: 121/59, 83 HR BP sitting: 106/62, 89 HR BP standing: 102/61, 109 HR BP standing > 3 min: 79/35, 106 HR (assisted to sit in chair; pt with no warning of symptoms) BP after seated in chair with legs elevated: 95/68, 95 HR BP seated in chair > 5 min: 80/63, 100HR      If plan is discharge home, recommend the following:  Assistance with cooking/housework;Direct supervision/assist for medications management;Direct supervision/assist for financial management;Assist for transportation;Supervision due to cognitive status   Equipment Recommendations  None recommended by OT    Recommendations for Other Services Rehab consult    Precautions / Restrictions Precautions Precautions: Fall Precaution/Restrictions Comments: monitor orthostatics, syncopal event during admission Restrictions Weight Bearing Restrictions Per Provider Order: No       Mobility Bed Mobility Overal bed  mobility: Modified Independent                  Transfers Overall transfer level: Needs assistance Equipment used: None Transfers: Sit to/from Stand Sit to Stand: Supervision                 Balance Overall balance assessment: Mild deficits observed, not formally tested                                         ADL either performed or assessed with clinical judgement   ADL Overall ADL's : Needs assistance/impaired     Grooming: Supervision/safety;Standing;Wash/dry face;Oral care Grooming Details (indicate cue type and reason): supervision for safety given recent syncope                             Functional mobility during ADLs: Contact guard assist General ADL Comments: able to mobilize in room without AD with slow pace, never reported dizziness though observed pt with eye widening standing at recliner for BP assessment after activity requiring cues/assist to sit in recliner    Extremity/Trunk Assessment Upper Extremity Assessment Upper Extremity Assessment: Overall WFL for tasks assessed;Right hand dominant   Lower Extremity Assessment Lower Extremity Assessment: Defer to PT evaluation        Vision   Vision Assessment?: No apparent visual deficits   Perception     Praxis     Communication Communication Communication: No apparent difficulties   Cognition Arousal: Alert Behavior During Therapy: WFL for tasks assessed/performed Cognition: Cognition impaired     Awareness: Intellectual awareness intact, Online awareness impaired Memory  impairment (select all impairments): Working [employer] fast streamer, Short-term memory Attention impairment (select first level of impairment): Selective attention Executive functioning impairment (select all impairments): Organization, Sequencing OT - Cognition Comments: Pleasant, able to recount details of pet cat at home, describe family. Mod cues for recall of two step trail making tasks as pt with STM  memory deficits. Administered Short Blessed test with score of 17 (normal cognition 0-4) with deficits in time awareness (guessed time as 12PM but around 8AM), working memory when attempting to name months in reverse order and delayed recall indicating significant impairments in executive functioning below pt's normal baseline (working at [employer] center)                 Following commands: Impaired Following commands impaired: Only follows one step commands consistently, Follows multi-step commands with increased time      Cueing   Cueing Techniques: Verbal cues  Exercises      Shoulder Instructions       General Comments Discussed BP with nursing, after OT session, made aware of ted hose in room though unable to locate in room during session. At end of session, pt had also discussed TED hose with pt though pt did not report awareness of being provided stockings    Pertinent Vitals/ Pain       Pain Assessment Pain Assessment: No/denies pain  Home Living                                          Prior Functioning/Environment              Frequency  Min 2X/week        Progress Toward Goals  OT Goals(current goals can now be found in the care plan section)  Progress towards OT goals: Progressing toward goals  Acute Rehab OT Goals Patient Stated Goal: home soon OT Goal Formulation: With patient Time For Goal Achievement: 09/20/23 Potential to Achieve Goals: Fair ADL Goals Pt Will Perform Grooming: Independently;standing Pt Will Transfer to Toilet: with modified independence;ambulating Additional ADL Goal #1: Pt to complete further functional cognitive assessments (pill box, etc) Additional ADL Goal #2: Pt to complete 3 step trail making task with no more than min verbal cues  Plan      Co-evaluation                 AM-PAC OT 6 Clicks Daily Activity     Outcome Measure   Help from another person eating meals?:  None Help from another person taking care of personal grooming?: A Little Help from another person toileting, which includes using toliet, bedpan, or urinal?: A Little Help from another person bathing (including washing, rinsing, drying)?: A Little Help from another person to put on and taking off regular upper body clothing?: None Help from another person to put on and taking off regular lower body clothing?: A Little 6 Click Score: 20    End of Session Equipment Utilized During Treatment: Gait belt  OT Visit Diagnosis: Other symptoms and signs involving cognitive function;Muscle weakness (generalized) (M62.81)   Activity Tolerance Patient tolerated treatment well   Patient Left in chair;with call bell/phone within reach   Nurse Communication Mobility status;Other (comment) (BP, ted hose)        Time: 9256-9184 OT Time Calculation (min): 32 min  Charges: OT General Charges $OT Visit: 1 Visit OT Treatments $Self Care/Home  Management : 8-22 mins $Therapeutic Activity: 8-22 mins  Mliss NOVAK, OTR/L Acute Rehab Services Office: [phone]   Mliss Fish 09/14/2023, 8:42 AM

## 2023-09-14 NOTE — TOC Transition Note (Signed)
 Transition of Care [facility]) - Discharge Note   Patient Details  Name: Jack Cunningham MRN: [id number] Date of Birth: 1960/08/25  Transition of Care [facility] CM/SW Contact:  Marval Gell, RN Phone Number: 09/14/2023, 9:45 AM   Clinical Narrative:     [place]ris w patient on the phone. Discussed needs for DC. He states he will need a RW, this has been ordered to be delivered to his room through [employer].  Discovered that previous referral was declined (incomplete), so new referral has been placed for PT OT to [facility] street. Patient instructed to call at DC for faster scheduling.  Added to AVS   Final next level of care: OP Rehab Barriers to Discharge: No Barriers Identified   Patient Goals and CMS Choice            Discharge Placement                       Discharge Plan and Services Additional resources added to the After Visit Summary for       Post Acute Care Choice: Durable Medical Equipment, Home Health          DME Arranged: Walker rolling DME Agency: [employer] Date DME Agency Contacted: 09/08/23 Time DME Agency Contacted: 1226 Representative spoke with at DME Agency: Thomasina            Social Drivers of Health (SDOH) Interventions SDOH Screenings   Food Insecurity: No Food Insecurity (09/03/2023)  Housing: Low Risk  (09/03/2023)  Transportation Needs: No Transportation Needs (09/03/2023)  Utilities: Not At Risk (09/03/2023)     Readmission Risk Interventions     No data to display

## 2023-09-14 NOTE — Plan of Care (Signed)

## 2023-09-14 NOTE — Progress Notes (Signed)
 PROGRESS NOTE        PATIENT DETAILS Name: Jack Cunningham Age: 63 y.o. Sex: male Date of Birth: 20-Jun-1960 Admit Date: 09/02/2023 Admitting Physician Toribio JAYSON Sharps, MD ERE:[facility], No Pcp Per  Brief Summary: Patient is a 63 y.o.  male with history of DM-2 (recent diagnosis), HTN, asthma/OSA-who was brought to the hospital by EMS-after they were called by his spouse-when she heard a loud sound-and found the patient on the floor asking for help.  Apparently patient was recently diagnosed with DM-following several weeks of not feeling well and polydipsia.  In the ED-he was lethargic-upon further evaluation-he was found to have DKA, AKI and sepsis secondary to pneumonia/pancreatitis.  He was subsequently admitted to the ICU-stabilized and transferred to [facility] on 7/8.  Significant events: 7/3>> Admit to ICU-DKA 7/4>> remained on insulin  drip, hypernatremia - remained on D5W 7/5>> remained on insulin  drip and D5W due to hypernatremia, overnight spike fever, CT Chest/Abd/Pelv showing pneumonia. Started on zosyn  and linezolid . 7/6>> endotool stopped 7/8>> transferred to [facility] with  Significant studies: 7/3>> CXR: No PNA. 7/3>> x-ray pelvis: No fracture/dislocation. 7/3>> CT head: No acute intracranial abnormality 7/3>> CT C-spine: No acute intracranial abnormality. 7/4>> renal ultrasound: No hydronephrosis. 7/5>> CT chest/abdomen/pelvis: Lobar consolidation right lower lobe lung.  Significant microbiology data: 7/3>> blood culture: No growth 7/5>> blood culture: No growth  Procedures: None  Consults: PCCM.  Subjective: Patient in bed, appears comfortable, denies any headache, no fever, no chest pain or pressure, no shortness of breath , no abdominal pain. No focal weakness.  Objective: Vitals: Blood pressure 107/65, pulse 92, temperature 99.2 F (37.3 C), temperature source Oral, resp. rate 14, height 5' 10 (1.778 m), weight 93.8 kg, SpO2 98%.    Exam:  Awake Alert, No new F.N deficits, Normal affect [place].AT,PERRAL Supple Neck, No JVD,   Symmetrical Chest wall movement, Good air movement bilaterally, CTAB RRR,No Gallops, Rubs or new Murmurs,  +ve B.Sounds, Abd Soft, No tenderness,   Swelling in the left arm around the previous IV site.   Assessment/Plan:   DKA and newly diagnosed DM-2 with uncontrolled hyperglycemia/DKA on presentation (A1c 13.5) with severe dehydration and hypernatremia  Has been now transition to subcu long-acting insulin  and sliding scale, CBG stable, education has been provided.  Recent Labs    09/13/23 1137 09/13/23 1645 09/14/23 0846  GLUCAP 203* 156* 156*    Sepsis secondary to PNA (POA) Sepsis physiology has resolved Lab significant leukocytosis, CT suggestive of possible ongoing bronchitis, previously had been treated with 7 days of Zosyn  and Zyvox , rechallenge with Levaquin  and doxycycline  and monitor.   Leukocytosis improving.  AKI  Due to dehydration, after IV fluids AKI has resolved  Hypernatremia.  Pretty persistent, required multiple days of D5W, will monitor, will also check 24-hour urine output along with sodium and potassium.    Leukocytosis.  This is acute and reactive, discussed with PCP office on 09/13/2023, 1 month ago had normal creatinine and normal WBC count.  With antibiotics leukocytosis is improving.  Acute metabolic encephalopathy Suspect secondary to DKA/AKI/sepsis/PNA CT negative.  Resolved.  Pancreatitis Possibly related to DKA CT abdomen negative for gallstones.  Triglycerides were not elevated. Unclear if her abdominal pain on presentation. Abdomen currently soft-nontender on exam-tolerating initiation of diet.  Outpatient GI follow-up postdischarge  Left arm swelling.  Around the IV site, IV removed, does have left  upper extremity SVT, warm compress and aspirin , IV removed, relatively symptom-free.  HTN BP stable without use of any  antihypertensives Losartan remains on hold.  HLD Resume fenofibrate /pravastatin -in next several days.  Bronchial asthma Not in exacerbation Continue bronchodilators.  Code status:   Code Status: Full Code   DVT Prophylaxis: Place TED hose Start: 09/13/23 1433 heparin  injection 5,000 Units Start: 09/05/23 1045 SCDs Start: 09/02/23 1132   Family Communication: Spouse bedside on 09/11/2023, over the phone on 09/12/2023, wife bedside 09/13/2023   Disposition Plan: Status is: Inpatient Remains inpatient appropriate because: Severity of illness   Planned Discharge Destination:Home   Diet: Diet Order             Diet Carb Modified Fluid consistency: Thin; Room service appropriate? Yes  Diet effective now                    Data Review:   Inpatient Medications  Scheduled Meds:  arformoterol   15 mcg Nebulization BID   aspirin   81 mg Oral Daily   budesonide  (PULMICORT ) nebulizer solution  0.5 mg Nebulization BID   Chlorhexidine  Gluconate Cloth  6 each Topical Daily   doxycycline   100 mg Oral Q12H   feeding supplement  237 mL Oral BID BM   heparin  injection (subcutaneous)  5,000 Units Subcutaneous Q8H   insulin  aspart  0-9 Units Subcutaneous TID WC   insulin  glargine-yfgn  20 Units Subcutaneous Daily   insulin  starter kit- pen needles  1 kit Other Once   levofloxacin   750 mg Oral Daily   mouth rinse  15 mL Mouth Rinse 4 times per day   pantoprazole   40 mg Oral Daily   Continuous Infusions:  sodium chloride      promethazine  (PHENERGAN ) injection (IM or IVPB)     PRN Meds:.acetaminophen  **OR** acetaminophen , antiseptic oral rinse, dextrose , hydrALAZINE , ipratropium-albuterol , lip balm, ondansetron  (ZOFRAN ) IV, mouth rinse, polyethylene glycol, promethazine  (PHENERGAN ) injection (IM or IVPB)  DVT Prophylaxis  Recent Labs  Lab 09/10/23 0636 09/11/23 0613 09/12/23 0729 09/12/23 2343 09/14/23 0551  WBC 20.0* 21.0* 23.0* 22.2* 15.4*  HGB 9.2* 8.6* 9.0* 7.8* 7.5*   HCT 30.7* 28.8* 29.7* 26.0* 25.2*  PLT 220 221 254 216 197  MCV 96.5 97.3 96.7 97.0 97.7  MCH 28.9 29.1 29.3 29.1 29.1  MCHC 30.0 29.9* 30.3 30.0 29.8*  RDW 12.3 12.4 12.4 12.1 12.4  LYMPHSABS  --  2.3 2.8 2.4 1.9  MONOABS  --  0.8 1.1* 0.2 0.7  EOSABS  --  0.6* 0.4 0.2 0.2  BASOSABS  --  0.0 0.1 0.0 0.1    Recent Labs  Lab 09/08/23 0458 09/09/23 0616 09/10/23 0636 09/11/23 0613 09/12/23 0729 09/12/23 2343 09/14/23 0551  NA 160*   < > 157* 157* 152* 145 147*  K 4.0   < > 3.5 3.7 3.5 3.6 3.8  CL 123*   < > 122* 123* 119* 113* 115*  CO2 25   < > 25 24 25 23 24   ANIONGAP 12   < > 10 10 8 9 8   GLUCOSE 135*   < > 126* 60* 151* 214* 164*  BUN 68*   < > 30* 27* 19 19 17   CREATININE 2.33*   < > 1.75* 1.65* 1.59* 1.49* 1.48*  CRP  --   --   --  1.0* 0.7 0.6 0.6  PROCALCITON  --   --   --  6.98 4.29 2.73  --   MG 3.2*  --   --  2.2 2.1 1.9 1.9  PHOS 3.5  --   --  4.2 3.7 3.3 3.3  CALCIUM  8.5*   < > 8.8* 8.8* 8.9 8.4* 8.6*   < > = values in this interval not displayed.      Recent Labs  Lab 09/08/23 0458 09/09/23 0616 09/10/23 0636 09/11/23 0613 09/12/23 0729 09/12/23 2343 09/14/23 0551  CRP  --   --   --  1.0* 0.7 0.6 0.6  PROCALCITON  --   --   --  6.98 4.29 2.73  --   MG 3.2*  --   --  2.2 2.1 1.9 1.9  CALCIUM  8.5*   < > 8.8* 8.8* 8.9 8.4* 8.6*   < > = values in this interval not displayed.    --------------------------------------------------------------------------------------------------------------- Lab Results  Component Value Date   TRIG 146 09/03/2023    Lab Results  Component Value Date   HGBA1C 13.5 (H) 09/02/2023   No results for input(s): TSH, T4TOTAL, FREET4, T3FREE, THYROIDAB in the last 72 hours. No results for input(s): VITAMINB12, FOLATE, FERRITIN, TIBC, IRON, RETICCTPCT in the last 72 hours. ------------------------------------------------------------------------------------------------------------------ Cardiac  Enzymes No results for input(s): CKMB, TROPONINI, MYOGLOBIN in the last 168 hours.  Invalid input(s): CK  Micro Results Recent Results (from the past 240 hours)  Culture, blood (Routine X 2) w Reflex to ID Panel     Status: None   Collection Time: 09/04/23 11:42 PM   Specimen: BLOOD  Result Value Ref Range Status   Specimen Description BLOOD RIGHT ARM  Final   Special Requests   Final    BOTTLES DRAWN AEROBIC AND ANAEROBIC Blood Culture results may not be optimal due to an inadequate volume of blood received in culture bottles   Culture   Final    NO GROWTH 5 DAYS Performed at [facility], [address] [place], [place] [address]    Report Status 09/09/2023 FINAL  Final  Culture, blood (Routine X 2) w Reflex to ID Panel     Status: None   Collection Time: 09/04/23 11:45 PM   Specimen: BLOOD  Result Value Ref Range Status   Specimen Description BLOOD RIGHT HAND  Final   Special Requests   Final    BOTTLES DRAWN AEROBIC AND ANAEROBIC Blood Culture results may not be optimal due to an inadequate volume of blood received in culture bottles   Culture   Final    NO GROWTH 5 DAYS Performed at [facility], [address], [place], [place] [address]    Report Status 09/09/2023 FINAL  Final  Culture, blood (Routine X 2) w Reflex to ID Panel     Status: None (Preliminary result)   Collection Time: 09/12/23  6:14 PM   Specimen: BLOOD  Result Value Ref Range Status   Specimen Description BLOOD BLOOD RIGHT ARM  Final   Special Requests   Final    BOTTLES DRAWN AEROBIC AND ANAEROBIC Blood Culture results may not be optimal due to an inadequate volume of blood received in culture bottles   Culture   Final    NO GROWTH 2 DAYS Performed at [facility], [address], [place], [place] [address]    Report Status PENDING  Incomplete  Culture, blood (Routine X 2) w Reflex to ID Panel     Status: None (Preliminary result)   Collection Time: 09/12/23 11:43 PM    Specimen: BLOOD  Result Value Ref Range Status   Specimen Description BLOOD  RIGHT HAND  Final   Special Requests   Final    BOTTLES DRAWN AEROBIC AND ANAEROBIC Blood Culture results may not be optimal due to an inadequate volume of blood received in culture bottles   Culture   Final    NO GROWTH 1 DAY Performed at [facility], [address] [place], [place] [address]    Report Status PENDING  Incomplete    Radiology Reports  VAS US  UPPER EXTREMITY VENOUS DUPLEX Result Date: 09/13/2023 UPPER VENOUS STUDY  Patient Name:  Jack Cunningham  Date of Exam:   09/13/2023 Medical Rec #: [id number]         Accession #:    [id number] Date of Birth: Jan 20, 1961          Patient Gender: M Patient Age:   66 years Exam Location:  [facility] Procedure:      VAS US  UPPER EXTREMITY VENOUS DUPLEX Referring Phys: LAVADA [facility] --------------------------------------------------------------------------------  Indications: Swelling Performing Technologist: Elmarie Lindau, RVT  Examination Guidelines: A complete evaluation includes B-mode imaging, spectral Doppler, color Doppler, and power Doppler as needed of all accessible portions of each vessel. Bilateral testing is considered an integral part of a complete examination. Limited examinations for reoccurring indications may be performed as noted.  Right Findings: +----------+------------+---------+-----------+----------+-------+ RIGHT     CompressiblePhasicitySpontaneousPropertiesSummary +----------+------------+---------+-----------+----------+-------+ Subclavian               Yes                                +----------+------------+---------+-----------+----------+-------+  Left Findings: +----------+------------+---------+-----------+----------+---------------+ LEFT      CompressiblePhasicitySpontaneousProperties    Summary     +----------+------------+---------+-----------+----------+---------------+ IJV           Full                                                   +----------+------------+---------+-----------+----------+---------------+ Subclavian    Full                                                  +----------+------------+---------+-----------+----------+---------------+ Axillary      Full                                                  +----------+------------+---------+-----------+----------+---------------+ Brachial      Full                                                  +----------+------------+---------+-----------+----------+---------------+ Radial        Full                                                  +----------+------------+---------+-----------+----------+---------------+ Ulnar         Full                                                  +----------+------------+---------+-----------+----------+---------------+  Cephalic      None                                       Acute      +----------+------------+---------+-----------+----------+---------------+ Basilic       Full                                  Thickened walls +----------+------------+---------+-----------+----------+---------------+ The left cephalic is not seen in the upper arm, however there is acute appearing, noncompressible thrombus in the median cubital vein into the cephalic vein throughout the forearm.  Summary:  Right: No evidence of thrombosis in the subclavian.  Left: Findings consistent with acute superficial vein thrombosis involving the left cephalic vein. Acute superficial vein thrombosis in the median cubital vein.  *See table(s) above for measurements and observations.  Diagnosing physician: Fonda Rim Electronically signed by Fonda Rim on 09/13/2023 at 6:56:53 PM.    Final    CT CHEST ABDOMEN PELVIS WO CONTRAST Result Date: 09/12/2023 CLINICAL DATA:  Sepsis EXAM: CT CHEST, ABDOMEN AND PELVIS WITHOUT CONTRAST TECHNIQUE: Multidetector CT imaging of the chest, abdomen and  pelvis was performed following the standard protocol without IV contrast. RADIATION DOSE REDUCTION: This exam was performed according to the departmental dose-optimization program which includes automated exposure control, adjustment of the mA and/or kV according to patient size and/or use of iterative reconstruction technique. COMPARISON:  09/04/2023 FINDINGS: CT CHEST FINDINGS Cardiovascular: Atherosclerotic calcification of the aortic arch and left anterior descending coronary artery. Mediastinum/Nodes: Unremarkable Lungs/Pleura: Biapical pleuroparenchymal scarring. Airway thickening is present, suggesting bronchitis or reactive airways disease. The previous consolidation of volume loss in the right lower lobe has substantially improved. There is currently extensive tree-in-bud nodularity in the peribronchovascular region of the right lower lobe favoring atypical infectious bronchiolitis. Lesser similar findings in the right middle lobe. Musculoskeletal: Degenerative right sternoclavicular arthropathy. Mild thoracic spondylosis. CT ABDOMEN PELVIS FINDINGS Hepatobiliary: Unremarkable unremarkable tech contracted gallbladder. Otherwise unremarkable. Pancreas: Unremarkable Spleen: Unremarkable Adrenals/Urinary Tract: Right posterior bladder diverticulum. Otherwise unremarkable. Stomach/Bowel: Unremarkable Vascular/Lymphatic: Atherosclerosis is present, including aortoiliac atherosclerotic disease. Reproductive: Unremarkable Other: No supplemental non-categorized findings. Musculoskeletal: Unremarkable IMPRESSION: 1. Improved aeration in the right lower lobe, currently with substantial tree-in-bud nodularity in the peribronchovascular region of the right lower lobe favoring atypical infectious bronchiolitis. Lesser similar findings in the right middle lobe. 2. Airway thickening is present, suggesting bronchitis or reactive airways disease. 3. Right posterior bladder diverticulum. 4.  Aortic Atherosclerosis  (ICD10-I70.0). Electronically Signed   By: Ryan Salvage M.D.   On: 09/12/2023 16:35      Signature  -   Lavada Stank M.D on 09/14/2023 at 9:00 AM   -  To page go to [email or web address]

## 2023-09-14 NOTE — Evaluation (Signed)
 Physical Therapy Re-Evaluation Patient Details Name: Jack Cunningham MRN: [id number] DOB: 10-30-1960 Today's Date: 09/14/2023  History of Present Illness  Pt is a 63 y/o M admitted on 09/02/23 after presenting following his wife finding him on the floor asking for help. Pt found to have DKA, AKI, & sepsis 2/2 PNA/pancreatitis. Pt also being treated for severe dehydration & hypernatremia. PT has been consulted for re-evaluation. PMH: DM2, HTN, asthma, OSA  Clinical Impression  Pt seen for PT re-evaluation with pt agreeable, BLE thigh high ted hose donned.  Pt is able to complete bed mobility with mod I, sit<>stand with supervision & ambulates short distance in room, maneuvering IV pole without LOB with CGA. Pt initially reports slight dizziness upon standing but state symptoms resolve despite lowering BP with activity. Further gait deferred 2/2 dropping BP after short distance gait. Will continue to follow pt acutely to progress mobility as able.  BP in RUE: Sitting EOB: 108/65 mmHg MAP 78 Standing at 0: 118/58 mmHg MAP 75 Standing at 2: 109/74 mmHg MAP 82 After gait: 94/55 mmHg MAP 65    If plan is discharge home, recommend the following: A little help with walking and/or transfers;A little help with bathing/dressing/bathroom;Assistance with cooking/housework;Direct supervision/assist for medications management;Direct supervision/assist for financial management;Assist for transportation;Help with stairs or ramp for entrance   Can travel by private vehicle        Equipment Recommendations Rolling walker (2 wheels)  Recommendations for Other Services       Functional Status Assessment Patient has had a recent decline in their functional status and demonstrates the ability to make significant improvements in function in a reasonable and predictable amount of time.     Precautions / Restrictions Precautions Precautions: Fall Precaution/Restrictions Comments: watch BP! Restrictions Weight  Bearing Restrictions Per Provider Order: No      Mobility  Bed Mobility Overal bed mobility: Needs Assistance, Modified Independent Bed Mobility: Supine to Sit, Sit to Supine     Supine to sit: Modified independent (Device/Increase time), HOB elevated, Used rails Sit to supine: Modified independent (Device/Increase time), Used rails, HOB elevated        Transfers Overall transfer level: Needs assistance Equipment used: None Transfers: Sit to/from Stand Sit to Stand: Supervision                Ambulation/Gait Ambulation/Gait assistance: Contact guard assist Gait Distance (Feet): 15 Feet Assistive device: IV Pole Gait Pattern/deviations: Decreased step length - right, Decreased step length - left, Decreased stride length Gait velocity: decreased        Stairs            Wheelchair Mobility     Tilt Bed    Modified Rankin (Stroke Patients Only)       Balance Overall balance assessment: Needs assistance Sitting-balance support: Feet supported Sitting balance-Leahy Scale: Good     Standing balance support: During functional activity, Single extremity supported Standing balance-Leahy Scale: Fair                               Pertinent Vitals/Pain Pain Assessment Pain Assessment: No/denies pain    Home Living Family/patient expects to be discharged to:: Private residence Living Arrangements: Spouse/significant other;Children Available Help at Discharge: Family;Available 24 hours/day Type of Home: House Home Access: Stairs to enter Entrance Stairs-Rails: None Entrance Stairs-Number of Steps: 2   Home Layout: One level Home Equipment: None      Prior Function Prior  Level of Function : Independent/Modified Independent             Mobility Comments: ambulated limited community distances without AD ADLs Comments: pt is independent in ADLs, family provides for iADLs. does not drive but does report push mowing the yard      Extremity/Trunk Assessment   Upper Extremity Assessment Upper Extremity Assessment: Overall WFL for tasks assessed    Lower Extremity Assessment Lower Extremity Assessment: Generalized weakness       Communication   Communication Communication: No apparent difficulties    Cognition Arousal: Alert Behavior During Therapy: WFL for tasks assessed/performed, Flat affect (somewhat flat towards end of session)   PT - Cognitive impairments: No apparent impairments                         Following commands: Intact Following commands impaired: Only follows one step commands consistently     Cueing Cueing Techniques: Verbal cues     General Comments General comments (skin integrity, edema, etc.): Pt with thigh high ted hose donned upon PT arrival.    Exercises     Assessment/Plan    PT Assessment Patient needs continued PT services  PT Problem List Decreased balance;Decreased mobility;Decreased coordination;Decreased cognition;Decreased knowledge of use of DME;Decreased activity tolerance       PT Treatment Interventions DME instruction;Balance training;Gait training;Neuromuscular re-education;Stair training;Functional mobility training;Therapeutic activities;Therapeutic exercise;Patient/family education    PT Goals (Current goals can be found in the Care Plan section)  Acute Rehab PT Goals Patient Stated Goal: get better PT Goal Formulation: With patient Time For Goal Achievement: 09/28/23 Potential to Achieve Goals: Good    Frequency Min 3X/week     Co-evaluation               AM-PAC PT 6 Clicks Mobility  Outcome Measure Help needed turning from your back to your side while in a flat bed without using bedrails?: None Help needed moving from lying on your back to sitting on the side of a flat bed without using bedrails?: A Little Help needed moving to and from a bed to a chair (including a wheelchair)?: A Little Help needed standing up from  a chair using your arms (e.g., wheelchair or bedside chair)?: A Little Help needed to walk in hospital room?: A Little Help needed climbing 3-5 steps with a railing? : A Little 6 Click Score: 19    End of Session Equipment Utilized During Treatment: Gait belt Activity Tolerance: Treatment limited secondary to medical complications (Comment);Patient tolerated treatment well (limited 2/2 BP dropping with gait) Patient left: in bed;with call bell/phone within reach;with bed alarm set Nurse Communication: Mobility status (Nurse & MD made aware of BP during session) PT Visit Diagnosis: Other abnormalities of gait and mobility (R26.89);Unsteadiness on feet (R26.81)    Time: 8655-8597 PT Time Calculation (min) (ACUTE ONLY): 18 min   Charges:   PT Evaluation $PT Re-evaluation: 1 Re-eval   PT General Charges $$ ACUTE PT VISIT: 1 Visit         Richerd Pinal, PT, DPT 09/14/23, 2:09 PM   Richerd CHRISTELLA Pinal 09/14/2023, 2:07 PM

## 2023-09-15 DIAGNOSIS — E1110 Type 2 diabetes mellitus with ketoacidosis without coma: Secondary | ICD-10-CM | POA: Diagnosis not present

## 2023-09-15 LAB — CBC WITH DIFFERENTIAL/PLATELET
Abs Immature Granulocytes: 0.73 K/uL — ABNORMAL HIGH (ref 0.00–0.07)
Basophils Absolute: 0.1 K/uL (ref 0.0–0.1)
Basophils Relative: 1 %
Eosinophils Absolute: 0.2 K/uL (ref 0.0–0.5)
Eosinophils Relative: 1 %
HCT: 25.4 % — ABNORMAL LOW (ref 39.0–52.0)
Hemoglobin: 7.6 g/dL — ABNORMAL LOW (ref 13.0–17.0)
Immature Granulocytes: 6 %
Lymphocytes Relative: 11 %
Lymphs Abs: 1.4 K/uL (ref 0.7–4.0)
MCH: 28.9 pg (ref 26.0–34.0)
MCHC: 29.9 g/dL — ABNORMAL LOW (ref 30.0–36.0)
MCV: 96.6 fL (ref 80.0–100.0)
Monocytes Absolute: 0.6 K/uL (ref 0.1–1.0)
Monocytes Relative: 5 %
Neutro Abs: 9.6 K/uL — ABNORMAL HIGH (ref 1.7–7.7)
Neutrophils Relative %: 76 %
Platelets: 185 K/uL (ref 150–400)
RBC: 2.63 MIL/uL — ABNORMAL LOW (ref 4.22–5.81)
RDW: 13.7 % (ref 11.5–15.5)
Smear Review: NORMAL
WBC: 12.5 K/uL — ABNORMAL HIGH (ref 4.0–10.5)
nRBC: 0.8 % — ABNORMAL HIGH (ref 0.0–0.2)

## 2023-09-15 LAB — GLUCOSE, CAPILLARY
Glucose-Capillary: 137 mg/dL — ABNORMAL HIGH (ref 70–99)
Glucose-Capillary: 149 mg/dL — ABNORMAL HIGH (ref 70–99)
Glucose-Capillary: 286 mg/dL — ABNORMAL HIGH (ref 70–99)
Glucose-Capillary: 206 mg/dL — ABNORMAL HIGH (ref 70–99)

## 2023-09-15 LAB — BASIC METABOLIC PANEL WITH GFR
Anion gap: 13 (ref 5–15)
BUN: 18 mg/dL (ref 8–23)
CO2: 23 mmol/L (ref 22–32)
Calcium: 8.9 mg/dL (ref 8.9–10.3)
Chloride: 111 mmol/L (ref 98–111)
Creatinine, Ser: 1.31 mg/dL — ABNORMAL HIGH (ref 0.61–1.24)
GFR, Estimated: >60 mL/min (ref >60)
Glucose, Bld: 175 mg/dL — ABNORMAL HIGH (ref 70–99)
Potassium: 3.8 mmol/L (ref 3.5–5.1)
Sodium: 147 mmol/L — ABNORMAL HIGH (ref 135–145)

## 2023-09-15 LAB — NA AND K (SODIUM & POTASSIUM), 24 H UR
Potassium Urine: 19 mmol/L
Potassium, 24H Ur: 32 mmol/d (ref 25–125)
Sodium, 24H Ur: 167 meq/d (ref 40–220)
Sodium, Ur: 98 mmol/L
Urine Total Volume-UNAK24: 1700 mL

## 2023-09-15 LAB — PROCALCITONIN: Procalcitonin: 0.57 ng/mL

## 2023-09-15 LAB — C-REACTIVE PROTEIN: CRP: 0.5 mg/dL (ref <1.0)

## 2023-09-15 MED ORDER — DEXTROSE 5 % IV BOLUS
1000.0000 mL | Freq: Once | INTRAVENOUS | Status: AC
  Administered 2023-09-15: 1000 mL via INTRAVENOUS

## 2023-09-15 MED ORDER — DEXTROSE 5 % IV SOLN: INTRAVENOUS | Status: DC

## 2023-09-15 NOTE — Evaluation (Signed)
 Speech Language Pathology Evaluation Patient Details Name: Jack Cunningham MRN: [id number] DOB: 02/01/1961 Today's Date: 09/15/2023 Time: 9040-8982 SLP Time Calculation (min) (ACUTE ONLY): 18 min  Problem List:  Patient Active Problem List   Diagnosis Date Noted   DKA (diabetic ketoacidosis) (HCC) 09/02/2023   Past Medical History: History reviewed. No pertinent past medical history. Past Surgical History: The histories are not reviewed yet. Please review them in the History navigator section and refresh this SmartLink. HPI:  Patient is a 63 y.o.  male with history of DM-2 (recent diagnosis), HTN, asthma/OSA-who was brought to the hospital by EMS-after they were called by his spouse-when she heard a loud sound-and found the patient on the floor asking for help.  Apparently patient was recently diagnosed with DM-following several weeks of not feeling well and polydipsia.  In the ED-he was lethargic-upon further evaluation-he was found to have DKA, AKI and sepsis secondary to pneumonia/pancreatitis.  He was subsequently admitted to the ICU-stabilized and transferred to [facility] on 7/8.   Assessment / Plan / Recommendation Clinical Impression  Patient presents with cognitive deficits in the areas of attention, memory, problem solving, orientation, executive functioning, and awareness which impact verbal and functional performance across all tasks presented. Patient is oriented to self and situaiton but slowed processing speed impacted ability to answer basic location questions such as what state are we in?. Patient reports that he is back to normal, despite performance of 7/30 on SLUMS examination. As patient currently holds a full time position and assists with household management tasks with several young and/or special needs children/grandchildren, suspect this is different from baseline despite the nonpresence of family members who would be able to provide cognitive history. Patient's  receptive/expressive language and motor speech appear to be intact. Patient would benefit from continued SLP services during admission to target aforementioned deficits.    SLP Assessment  SLP Recommendation/Assessment: Patient needs continued Speech Language Pathology Services SLP Visit Diagnosis: Cognitive communication deficit (R41.841)     Assistance Recommended at Discharge  Frequent or constant Supervision/Assistance  Functional Status Assessment Patient has had a recent decline in their functional status and demonstrates the ability to make significant improvements in function in a reasonable and predictable amount of time.  Frequency and Duration min 2x/week  2 weeks      SLP Evaluation Cognition  Overall Cognitive Status: Impaired/Different from baseline Arousal/Alertness: Awake/alert Orientation Level: Oriented to person;Oriented to situation;Disoriented to place;Disoriented to time Year: 2025 Month: July Day of Week: Incorrect Attention: Sustained Sustained Attention: Impaired Sustained Attention Impairment: Verbal basic;Functional basic Memory: Impaired Memory Impairment: Storage deficit;Retrieval deficit Awareness: Impaired Awareness Impairment: Intellectual impairment;Emergent impairment;Anticipatory impairment Problem Solving: Impaired Problem Solving Impairment: Verbal basic;Functional basic Executive Function: Reasoning;Organizing Reasoning: Impaired Reasoning Impairment: Verbal basic;Functional basic Organizing: Impaired Organizing Impairment: Verbal basic;Functional basic Safety/Judgment: Impaired       Comprehension  Auditory Comprehension Overall Auditory Comprehension: Appears within functional limits for tasks assessed    Expression Expression Primary Mode of Expression: Verbal Verbal Expression Overall Verbal Expression: Appears within functional limits for tasks assessed Initiation: No impairment   Oral / Motor  Oral Motor/Sensory  Function Overall Oral Motor/Sensory Function: Within functional limits Motor Speech Overall Motor Speech: Appears within functional limits for tasks assessed Intelligibility: Intelligible Motor Planning: Within functional limits           Rosina Downy, M.A., CCC-SLP  Karey Suthers A Deshawnda Acrey 09/15/2023, 12:58 PM

## 2023-09-15 NOTE — Progress Notes (Addendum)
 PROGRESS NOTE        PATIENT DETAILS Name: Jack Cunningham Age: 63 y.o. Sex: male Date of Birth: 1960/07/02 Admit Date: 09/02/2023 Admitting Physician Toribio JAYSON Sharps, MD ERE:[id number], No Pcp Per  Brief Summary: Patient is a 63 y.o.  male with history of DM-2 (recent diagnosis), HTN, asthma/OSA-who was brought to the hospital by EMS-after they were called by his spouse-when she heard a loud sound-and found the patient on the floor asking for help.  Apparently patient was recently diagnosed with DM-following several weeks of not feeling well and polydipsia.  In the ED-he was lethargic-upon further evaluation-he was found to have DKA, AKI and sepsis secondary to pneumonia/pancreatitis.  He was subsequently admitted to the ICU-stabilized and transferred to [facility] on 7/8.  Significant events: 7/3>> Admit to ICU-DKA 7/4>> remained on insulin  drip, hypernatremia - remained on D5W 7/5>> remained on insulin  drip and D5W due to hypernatremia, overnight spike fever, CT Chest/Abd/Pelv showing pneumonia. Started on zosyn  and linezolid . 7/6>> endotool stopped 7/8>> transferred to [facility] with  Significant studies: 7/3>> CXR: No PNA. 7/3>> x-ray pelvis: No fracture/dislocation. 7/3>> CT head: No acute intracranial abnormality 7/3>> CT C-spine: No acute intracranial abnormality. 7/4>> renal ultrasound: No hydronephrosis. 7/5>> CT chest/abdomen/pelvis: Lobar consolidation right lower lobe lung.  Significant microbiology data: 7/3>> blood culture: No growth 7/5>> blood culture: No growth  Procedures: None  Consults: PCCM.  Subjective: Patient in bed, appears comfortable, denies any headache, no fever, no chest pain or pressure, no shortness of breath , no abdominal pain. No focal weakness.  Objective: Vitals: Blood pressure 119/67, pulse 78, temperature 98.4 F (36.9 C), temperature source Oral, resp. rate (!) 9, height 5' 10 (1.778 m), weight 91.2 kg, SpO2 97%.    Exam:  Awake Alert, No new F.N deficits, Normal affect [address].AT,PERRAL Supple Neck, No JVD,   Symmetrical Chest wall movement, Good air movement bilaterally, CTAB RRR,No Gallops, Rubs or new Murmurs,  +ve B.Sounds, Abd Soft, No tenderness,   Swelling in the left arm around the previous IV site.   Assessment/Plan:   DKA and newly diagnosed DM-2 with uncontrolled hyperglycemia/DKA on presentation (A1c 13.5) with severe dehydration and hypernatremia  Has been now transition to subcu long-acting insulin  and sliding scale, CBG stable, education has been provided.  Recent Labs    09/14/23 1638 09/14/23 2114 09/15/23 0828  GLUCAP 109* 162* 137*    Sepsis secondary to PNA (POA) Sepsis physiology has resolved Lab significant leukocytosis, CT suggestive of possible ongoing bronchitis, previously had been treated with 7 days of Zosyn  and Zyvox , rechallenge with Levaquin  and doxycycline  and monitor.   Leukocytosis improving.  AKI  Due to dehydration, after IV fluids AKI has resolved  Hypernatremia.  Pretty persistent, has been adequately hydrated with D5W, urine output last 24 hours on 03/17/2023 was close to 3 L, urine sodium is 107 with urine osmolality 404 and serum osmolality 317.  Will request nephrology to opine.  Leukocytosis.  This is acute and reactive, discussed with PCP office on 09/13/2023, 1 month ago had normal creatinine and normal WBC count.  With antibiotics leukocytosis is improving.  Acute metabolic encephalopathy Suspect secondary to DKA/AKI/sepsis/PNA CT negative.  Resolved.  Pancreatitis Possibly related to DKA CT abdomen negative for gallstones.  Triglycerides were not elevated. Unclear if her abdominal pain on presentation. Abdomen currently soft-nontender on exam-tolerating initiation of diet.  Outpatient  GI follow-up postdischarge  Left arm swelling.  Around the IV site, IV removed, does have left upper extremity SVT, warm compress and aspirin , IV removed,  relatively symptom-free.  HTN BP stable without use of any antihypertensives Losartan remains on hold.  HLD Resume fenofibrate /pravastatin -in next several days.  Bronchial asthma Not in exacerbation Continue bronchodilators.  Code status:   Code Status: Full Code   DVT Prophylaxis: Place TED hose Start: 09/13/23 1433 heparin  injection 5,000 Units Start: 09/05/23 1045 SCDs Start: 09/02/23 1132   Family Communication: Spouse bedside on 09/11/2023, over the phone on 09/12/2023, wife bedside 09/13/2023, 09/14/2023, 09/15/2023   Disposition Plan: Status is: Inpatient Remains inpatient appropriate because: Severity of illness   Planned Discharge Destination:Home   Diet: Diet Order             Diet Carb Modified Fluid consistency: Thin; Room service appropriate? Yes with Assist  Diet effective now                    Data Review:   Inpatient Medications  Scheduled Meds:  arformoterol   15 mcg Nebulization BID   aspirin   81 mg Oral Daily   budesonide  (PULMICORT ) nebulizer solution  0.5 mg Nebulization BID   Chlorhexidine  Gluconate Cloth  6 each Topical Daily   doxycycline   100 mg Oral Q12H   feeding supplement  237 mL Oral BID BM   heparin  injection (subcutaneous)  5,000 Units Subcutaneous Q8H   insulin  aspart  0-9 Units Subcutaneous TID WC   insulin  glargine-yfgn  20 Units Subcutaneous Daily   insulin  starter kit- pen needles  1 kit Other Once   levofloxacin   750 mg Oral Daily   mouth rinse  15 mL Mouth Rinse 4 times per day   pantoprazole   40 mg Oral Daily   Continuous Infusions:  promethazine  (PHENERGAN ) injection (IM or IVPB)     PRN Meds:.acetaminophen  **OR** acetaminophen , antiseptic oral rinse, dextrose , hydrALAZINE , ipratropium-albuterol , lip balm, ondansetron  (ZOFRAN ) IV, mouth rinse, polyethylene glycol, promethazine  (PHENERGAN ) injection (IM or IVPB)  DVT Prophylaxis  Recent Labs  Lab 09/11/23 9386 09/12/23 0729 09/12/23 2343 09/14/23 0551  09/15/23 0517  WBC 21.0* 23.0* 22.2* 15.4* 12.5*  HGB 8.6* 9.0* 7.8* 7.5* 7.6*  HCT 28.8* 29.7* 26.0* 25.2* 25.4*  PLT 221 254 216 197 185  MCV 97.3 96.7 97.0 97.7 96.6  MCH 29.1 29.3 29.1 29.1 28.9  MCHC 29.9* 30.3 30.0 29.8* 29.9*  RDW 12.4 12.4 12.1 12.4 13.7  LYMPHSABS 2.3 2.8 2.4 1.9 1.4  MONOABS 0.8 1.1* 0.2 0.7 0.6  EOSABS 0.6* 0.4 0.2 0.2 0.2  BASOSABS 0.0 0.1 0.0 0.1 0.1    Recent Labs  Lab 09/11/23 0613 09/12/23 0729 09/12/23 2343 09/14/23 0551 09/15/23 0517  NA 157* 152* 145 147* 147*  K 3.7 3.5 3.6 3.8 3.8  CL 123* 119* 113* 115* 111  CO2 24 25 23 24 23   ANIONGAP 10 8 9 8 13   GLUCOSE 60* 151* 214* 164* 175*  BUN 27* 19 19 17 18   CREATININE 1.65* 1.59* 1.49* 1.48* 1.31*  CRP 1.0* 0.7 0.6 0.6 0.5  PROCALCITON 6.98 4.29 2.73 1.02 0.57  MG 2.2 2.1 1.9 1.9  --   PHOS 4.2 3.7 3.3 3.3  --   CALCIUM  8.8* 8.9 8.4* 8.6* 8.9      Recent Labs  Lab 09/11/23 0613 09/12/23 0729 09/12/23 2343 09/14/23 0551 09/15/23 0517  CRP 1.0* 0.7 0.6 0.6 0.5  PROCALCITON 6.98 4.29 2.73 1.02 0.57  MG 2.2  2.1 1.9 1.9  --   CALCIUM  8.8* 8.9 8.4* 8.6* 8.9    --------------------------------------------------------------------------------------------------------------- Lab Results  Component Value Date   TRIG 146 09/03/2023    Lab Results  Component Value Date   HGBA1C 13.5 (H) 09/02/2023   No results for input(s): TSH, T4TOTAL, FREET4, T3FREE, THYROIDAB in the last 72 hours. No results for input(s): VITAMINB12, FOLATE, FERRITIN, TIBC, IRON, RETICCTPCT in the last 72 hours. ------------------------------------------------------------------------------------------------------------------ Cardiac Enzymes No results for input(s): CKMB, TROPONINI, MYOGLOBIN in the last 168 hours.  Invalid input(s): CK  Micro Results Recent Results (from the past 240 hours)  Culture, blood (Routine X 2) w Reflex to ID Panel     Status: None (Preliminary  result)   Collection Time: 09/12/23  6:14 PM   Specimen: BLOOD  Result Value Ref Range Status   Specimen Description BLOOD BLOOD RIGHT ARM  Final   Special Requests   Final    BOTTLES DRAWN AEROBIC AND ANAEROBIC Blood Culture results may not be optimal due to an inadequate volume of blood received in culture bottles   Culture   Final    NO GROWTH 3 DAYS Performed at [facility], [address] [place], [place] [address]    Report Status PENDING  Incomplete  Culture, blood (Routine X 2) w Reflex to ID Panel     Status: None (Preliminary result)   Collection Time: 09/12/23 11:43 PM   Specimen: BLOOD  Result Value Ref Range Status   Specimen Description BLOOD RIGHT HAND  Final   Special Requests   Final    BOTTLES DRAWN AEROBIC AND ANAEROBIC Blood Culture results may not be optimal due to an inadequate volume of blood received in culture bottles   Culture   Final    NO GROWTH 2 DAYS Performed at [facility], [address] [place], [place] [address]    Report Status PENDING  Incomplete    Radiology Reports  VAS US  UPPER EXTREMITY VENOUS DUPLEX Result Date: 09/13/2023 UPPER VENOUS STUDY  Patient Name:  DONOVON MICHELETTI  Date of Exam:   09/13/2023 Medical Rec #: [id number]         Accession #:    [id number] Date of Birth: 1961-01-19          Patient Gender: M Patient Age:   45 years Exam Location:  [facility] Procedure:      VAS US  UPPER EXTREMITY VENOUS DUPLEX Referring Phys: [facility] --------------------------------------------------------------------------------  Indications: Swelling Performing Technologist: Elmarie Lindau, RVT  Examination Guidelines: A complete evaluation includes B-mode imaging, spectral Doppler, color Doppler, and power Doppler as needed of all accessible portions of each vessel. Bilateral testing is considered an integral part of a complete examination. Limited examinations for reoccurring indications may be performed as noted.  Right  Findings: +----------+------------+---------+-----------+----------+-------+ RIGHT     CompressiblePhasicitySpontaneousPropertiesSummary +----------+------------+---------+-----------+----------+-------+ Subclavian               Yes                                +----------+------------+---------+-----------+----------+-------+  Left Findings: +----------+------------+---------+-----------+----------+---------------+ LEFT      CompressiblePhasicitySpontaneousProperties    Summary     +----------+------------+---------+-----------+----------+---------------+ IJV           Full                                                  +----------+------------+---------+-----------+----------+---------------+  Subclavian    Full                                                  +----------+------------+---------+-----------+----------+---------------+ Axillary      Full                                                  +----------+------------+---------+-----------+----------+---------------+ Brachial      Full                                                  +----------+------------+---------+-----------+----------+---------------+ Radial        Full                                                  +----------+------------+---------+-----------+----------+---------------+ Ulnar         Full                                                  +----------+------------+---------+-----------+----------+---------------+ Cephalic      None                                       Acute      +----------+------------+---------+-----------+----------+---------------+ Basilic       Full                                  Thickened walls +----------+------------+---------+-----------+----------+---------------+ The left cephalic is not seen in the upper arm, however there is acute appearing, noncompressible thrombus in the median cubital vein into the cephalic vein  throughout the forearm.  Summary:  Right: No evidence of thrombosis in the subclavian.  Left: Findings consistent with acute superficial vein thrombosis involving the left cephalic vein. Acute superficial vein thrombosis in the median cubital vein.  *See table(s) above for measurements and observations.  Diagnosing physician: Fonda Rim Electronically signed by Fonda Rim on 09/13/2023 at 6:56:53 PM.    Final       Signature  -   Lavada Stank M.D on 09/15/2023 at 9:50 AM   -  To page go to [email or web address]

## 2023-09-15 NOTE — Progress Notes (Signed)
 Mobility Specialist: Progress Note   09/15/23 1607  Mobility  Level of Assistance Standby assist, set-up cues, supervision of patient - no hands on  Assistive Device None  Distance Ambulated (ft) 500 ft  Activity Response Tolerated well  Mobility Referral Yes  Mobility visit 1 Mobility  Mobility Specialist Start Time (ACUTE ONLY) 1243  Mobility Specialist Stop Time (ACUTE ONLY) 1254  Mobility Specialist Time Calculation (min) (ACUTE ONLY) 11 min    Pt received on EOB, agreeable to mobility session. SBA throughout ambulation. Requested to try stairs again. SBA going up 2 flights of stairs w/o use of handles. MinA going down the stairs for steadying assist with use of R handle. No other complaints besides fatigue. Inaccurate pulse ox reading but pt denies feeling SOB, dizzy, or lightheaded. Returned to room without fault. Left in chair with all needs met, call bell in reach.   Ileana Lute Mobility Specialist Please contact via SecureChat or Rehab office at [phone]

## 2023-09-15 NOTE — Plan of Care (Addendum)
 Pt has rested quietly throughout the night with no distress noted. Alert and oriented. On room air. SR on the monitor. Pt voiding per urinal. 24 hour urine on ice. Family member at bedside. No complaints voiced.     Problem: Education: Goal: Knowledge of General Education information will improve Description: Including pain rating scale, medication(s)/side effects and non-pharmacologic comfort measures Outcome: Progressing   Problem: Health Behavior/Discharge Planning: Goal: Ability to manage health-related needs will improve Outcome: Progressing   Problem: Clinical Measurements: Goal: Respiratory complications will improve Outcome: Progressing Goal: Cardiovascular complication will be avoided Outcome: Progressing   Problem: Activity: Goal: Risk for activity intolerance will decrease Outcome: Progressing   Problem: Pain Managment: Goal: General experience of comfort will improve and/or be controlled Outcome: Progressing   Problem: Safety: Goal: Ability to remain free from injury will improve Outcome: Progressing

## 2023-09-15 NOTE — Consult Note (Signed)
 Nephrology Consult   Requesting provider: Lavada Stank, MD Service requesting consult: [facility] Reason for consult: Hypernatremia   Assessment: 1. Hypernatremia 2. Normocytic Anemia 3. AKI  4. Sepsis 2/2 PNA 5. Pancreatitis  6. HTN 7. HLD 8. Bronchial Asthma   Recommendations:  Hypernatremia Euvolemic on exam. IVF's have been off since 16:00 yesterday (7/15). FW deficit is 2.7 L.  -Will give 1 L D5W bolus and start D5W infusion at 100 cc/hr. Also encouraged increased po intake.  -Trend AM BMP and consider further work-up (e.g. DI) if refractory.   Normocytic Anemia Hgb has dropped from 16.0 on 7/3 admission to 7.6 today. I suspect some degree of a dilutional component though serum osmolality was 317 yesterday. He does endorse 30 lbs of weight loss over the past few months which has surprised him. Management per primary, consider: FOBT, further hemolysis work-up, age-appropriate cancer screening with outpatient colonoscopy/PSA (prostate enlarged on 7/5 CTAP, not noted on 7/13 CTAP), etc. -Repeat UA -Trend CBC and Transfuse for Hgb<7 g/dL  AKI  Resolved though unclear baseline. Cr and GFR on admission were 5.10 and 12 respectively. Now 1.31 and > 60. UA on admission showed moderate Hgb with negative RBC. CK normal at that time. -IVF's per above and trend BMP -UA per above  Sepsis 2/2 PNA Pancreatitis  HTN HLD Bronchial Asthma Stable. Management per primary.   Recommendations conveyed to primary service.    Norman Lobstein [facility] 09/15/2023 11:14 AM _________________________________________________________________________________   History of Present Illness: Jack Cunningham is a/an 63 y.o. male with history or recently diagnosed T2DM, HTN, asthma, OSA who presented after a fall and admitted for DKA, AKI, and sepsis 2/2 PNA.  Medications:  Current Facility-Administered Medications  Medication Dose Route Frequency Provider Last Rate Last Admin    acetaminophen  (TYLENOL ) tablet 650 mg  650 mg Oral Q6H PRN Paliwal, Aditya, MD   650 mg at 09/04/23 2009   Or   acetaminophen  (TYLENOL ) suppository 650 mg  650 mg Rectal Q6H PRN Paliwal, Aditya, MD   650 mg at 09/05/23 1645   antiseptic oral rinse (BIOTENE) solution 15 mL  15 mL Mouth Rinse PRN Harrie Bruckner, DO   15 mL at 09/06/23 1202   arformoterol  (BROVANA ) nebulizer solution 15 mcg  15 mcg Nebulization BID Dewald, Jonathan B, MD   15 mcg at 09/15/23 9193   aspirin  chewable tablet 81 mg  81 mg Oral Daily Singh, Prashant K, MD   81 mg at 09/15/23 1021   budesonide  (PULMICORT ) nebulizer solution 0.5 mg  0.5 mg Nebulization BID Dewald, Jonathan B, MD   0.5 mg at 09/15/23 9193   Chlorhexidine  Gluconate Cloth 2 % PADS 6 each  6 each Topical Daily Kara Dorn NOVAK, MD   6 each at 09/14/23 2135   dextrose  50 % solution 25 g  25 g Intravenous PRN Raenelle Donalda HERO, MD   25 g at 09/11/23 2110   doxycycline  (VIBRA -TABS) tablet 100 mg  100 mg Oral Q12H Singh, Prashant K, MD   100 mg at 09/15/23 1021   feeding supplement (ENSURE PLUS HIGH PROTEIN) liquid 237 mL  237 mL Oral BID BM Ghimire, Shanker M, MD   237 mL at 09/14/23 0923   heparin  injection 5,000 Units  5,000 Units Subcutaneous Q8H Dewald, Jonathan B, MD   5,000 Units at 09/15/23 0546   hydrALAZINE  (APRESOLINE ) injection 10 mg  10 mg Intravenous Q4H PRN Paliwal, Aditya, MD       insulin  aspart (novoLOG ) injection 0-9  Units  0-9 Units Subcutaneous TID WC Singh, Prashant K, MD   1 Units at 09/14/23 1132   insulin  glargine-yfgn (SEMGLEE ) injection 20 Units  20 Units Subcutaneous Daily Singh, Prashant K, MD   20 Units at 09/15/23 1021   insulin  starter kit- pen needles (English) 1 kit  1 kit Other Once Kara Dorn NOVAK, MD       ipratropium-albuterol  (DUONEB) 0.5-2.5 (3) MG/3ML nebulizer solution 3 mL  3 mL Nebulization Q4H PRN Amin, Ankit C, MD   3 mL at 09/13/23 1926   levofloxacin  (LEVAQUIN ) tablet 750 mg  750 mg Oral Daily Singh, Prashant  K, MD   750 mg at 09/15/23 1022   lip balm (CARMEX) ointment   Topical PRN Kara Dorn NOVAK, MD       ondansetron  (ZOFRAN ) injection 4 mg  4 mg Intravenous Q6H PRN Claudene Toribio BROCKS, MD   4 mg at 09/08/23 2056   Oral care mouth rinse  15 mL Mouth Rinse 4 times per day Kara Dorn NOVAK, MD   15 mL at 09/15/23 1022   Oral care mouth rinse  15 mL Mouth Rinse PRN Kara Dorn NOVAK, MD       pantoprazole  (PROTONIX ) EC tablet 40 mg  40 mg Oral Daily Amin, Ankit C, MD   40 mg at 09/15/23 1021   polyethylene glycol (MIRALAX  / GLYCOLAX ) packet 17 g  17 g Oral Daily PRN Antonetta Moccasin B, NP       promethazine  (PHENERGAN ) 6.25 mg/NS 50 mL IVPB  6.25 mg Intravenous Q6H PRN Claudene Toribio BROCKS, MD         ALLERGIES Novocain [procaine]  MEDICAL HISTORY History reviewed. No pertinent past medical history.   SOCIAL HISTORY Social History   Socioeconomic History   Marital status: Married    Spouse name: Not on file   Number of children: Not on file   Years of education: Not on file   Highest education level: Not on file  Occupational History   Not on file  Tobacco Use   Smoking status: Not on file   Smokeless tobacco: Not on file  Substance and Sexual Activity   Alcohol use: Not on file   Drug use: Not on file   Sexual activity: Not on file  Other Topics Concern   Not on file  Social History Narrative   Not on file   Social Drivers of Health   Financial Resource Strain: Not on file  Food Insecurity: No Food Insecurity (09/03/2023)   Hunger Vital Sign    Worried About Running Out of Food in the Last Year: Never true    Ran Out of Food in the Last Year: Never true  Transportation Needs: No Transportation Needs (09/03/2023)   PRAPARE - Administrator, Civil Service (Medical): No    Lack of Transportation (Non-Medical): No  Physical Activity: Not on file  Stress: Not on file  Social Connections: Not on file  Intimate Partner Violence: Not At Risk (09/03/2023)   Humiliation,  Afraid, Rape, and Kick questionnaire    Fear of Current or Ex-Partner: No    Emotionally Abused: No    Physically Abused: No    Sexually Abused: No     FAMILY HISTORY No family history on file.    Review of Systems: 12 systems reviewed Otherwise as per HPI, all other systems reviewed and negative  Physical Exam: Vitals:   09/15/23 0600 09/15/23 0800  BP:  119/67  Pulse:  78  Resp: 18 (!) 9  Temp:  98.4 F (36.9 C)  SpO2:  97%   Total I/O In: 240 [P.O.:240] Out: 600 [Urine:600]  Intake/Output Summary (Last 24 hours) at 09/15/2023 1114 Last data filed at 09/15/2023 9164 Gross per 24 hour  Intake 480 ml  Output 2150 ml  Net -1670 ml   Physical Exam: Constitutional: well-appearing, lying in bed, in no acute distress HENT: normocephalic atraumatic, mucous membranes moist Cardiovascular: regular rate and rhythm, no m/r/g, no LEE Pulmonary/Chest: normal work of breathing on room air, lungs clear to auscultation bilaterally Abdominal: soft, non-tender, non-distended MSK: normal bulk and tone Neurological: alert & oriented Skin: warm and dry, normal skin turgor   Test Results Reviewed Lab Results  Component Value Date   NA 147 (H) 09/15/2023   K 3.8 09/15/2023   CL 111 09/15/2023   CO2 23 09/15/2023   BUN 18 09/15/2023   CREATININE 1.31 (H) 09/15/2023   CALCIUM  8.9 09/15/2023   ALBUMIN 3.3 (L) 09/03/2023   PHOS 3.3 09/14/2023

## 2023-09-16 ENCOUNTER — Other Ambulatory Visit (HOSPITAL_COMMUNITY): Payer: Self-pay

## 2023-09-16 ENCOUNTER — Encounter (HOSPITAL_COMMUNITY): Payer: Self-pay | Admitting: *Deleted

## 2023-09-16 ENCOUNTER — Telehealth (HOSPITAL_COMMUNITY): Payer: Self-pay | Admitting: Pharmacy Technician

## 2023-09-16 LAB — BASIC METABOLIC PANEL WITH GFR
Anion gap: 11 (ref 5–15)
BUN: 15 mg/dL (ref 8–23)
CO2: 24 mmol/L (ref 22–32)
Calcium: 8.9 mg/dL (ref 8.9–10.3)
Chloride: 105 mmol/L (ref 98–111)
Creatinine, Ser: 1.31 mg/dL — ABNORMAL HIGH (ref 0.61–1.24)
GFR, Estimated: >60 mL/min (ref >60)
Glucose, Bld: 189 mg/dL — ABNORMAL HIGH (ref 70–99)
Potassium: 3.5 mmol/L (ref 3.5–5.1)
Sodium: 140 mmol/L (ref 135–145)

## 2023-09-16 LAB — CBC WITH DIFFERENTIAL/PLATELET
Abs Immature Granulocytes: 0.26 K/uL — ABNORMAL HIGH (ref 0.00–0.07)
Basophils Absolute: 0.1 K/uL (ref 0.0–0.1)
Basophils Relative: 1 %
Eosinophils Absolute: 0.2 K/uL (ref 0.0–0.5)
Eosinophils Relative: 1 %
HCT: 27.3 % — ABNORMAL LOW (ref 39.0–52.0)
Hemoglobin: 8.4 g/dL — ABNORMAL LOW (ref 13.0–17.0)
Immature Granulocytes: 2 %
Lymphocytes Relative: 11 %
Lymphs Abs: 1.3 K/uL (ref 0.7–4.0)
MCH: 30.2 pg (ref 26.0–34.0)
MCHC: 30.8 g/dL (ref 30.0–36.0)
MCV: 98.2 fL (ref 80.0–100.0)
Monocytes Absolute: 0.5 K/uL (ref 0.1–1.0)
Monocytes Relative: 4 %
Neutro Abs: 9.8 K/uL — ABNORMAL HIGH (ref 1.7–7.7)
Neutrophils Relative %: 81 %
Platelets: 193 K/uL (ref 150–400)
RBC: 2.78 MIL/uL — ABNORMAL LOW (ref 4.22–5.81)
RDW: 14.6 % (ref 11.5–15.5)
WBC: 12.1 K/uL — ABNORMAL HIGH (ref 4.0–10.5)
nRBC: 0.4 % — ABNORMAL HIGH (ref 0.0–0.2)

## 2023-09-16 LAB — GLUCOSE, CAPILLARY: Glucose-Capillary: 146 mg/dL — ABNORMAL HIGH (ref 70–99)

## 2023-09-16 LAB — PROCALCITONIN: Procalcitonin: 0.32 ng/mL

## 2023-09-16 LAB — OSMOLALITY: Osmolality: 307 mosm/kg — ABNORMAL HIGH (ref 275–295)

## 2023-09-16 MED ORDER — TECHLITE PEN NEEDLES 32G X 4 MM MISC
1.0000 | Freq: Once | 0 refills | Status: AC
  Filled 2023-09-16: qty 100, 30d supply, fill #0

## 2023-09-16 MED ORDER — BLOOD GLUCOSE MONITOR SYSTEM W/DEVICE KIT
1.0000 | PACK | Freq: Three times a day (TID) | 0 refills | Status: AC
  Filled 2023-09-16: qty 1, 30d supply, fill #0

## 2023-09-16 MED ORDER — FENOFIBRATE 48 MG PO TABS
48.0000 mg | ORAL_TABLET | Freq: Every day | ORAL | 0 refills | Status: AC
  Filled 2023-09-16: qty 30, 30d supply, fill #0

## 2023-09-16 MED ORDER — PANTOPRAZOLE SODIUM 40 MG PO TBEC
40.0000 mg | DELAYED_RELEASE_TABLET | Freq: Every day | ORAL | 0 refills | Status: AC
  Filled 2023-09-16: qty 30, 30d supply, fill #0

## 2023-09-16 MED ORDER — ACCU-CHEK SOFTCLIX LANCETS MISC
1.0000 | Freq: Three times a day (TID) | 0 refills | Status: AC
  Filled 2023-09-16: qty 100, 30d supply, fill #0

## 2023-09-16 MED ORDER — INSULIN ASPART 100 UNIT/ML FLEXPEN
PEN_INJECTOR | SUBCUTANEOUS | 0 refills | Status: AC
  Filled 2023-09-16: qty 15, 50d supply, fill #0

## 2023-09-16 MED ORDER — LANCET DEVICE MISC
1.0000 | Freq: Three times a day (TID) | 0 refills | Status: AC
  Filled 2023-09-16: qty 1, 30d supply, fill #0

## 2023-09-16 MED ORDER — ASPIRIN 81 MG PO CHEW
81.0000 mg | CHEWABLE_TABLET | Freq: Every day | ORAL | 0 refills | Status: AC
Start: 2023-09-16 — End: ?
  Filled 2023-09-16: qty 30, 30d supply, fill #0

## 2023-09-16 MED ORDER — BLOOD GLUCOSE TEST VI STRP
1.0000 | ORAL_STRIP | Freq: Three times a day (TID) | 0 refills | Status: AC
  Filled 2023-09-16: qty 100, 34d supply, fill #0

## 2023-09-16 MED ORDER — LANTUS SOLOSTAR 100 UNIT/ML ~~LOC~~ SOPN
20.0000 [IU] | PEN_INJECTOR | Freq: Every day | SUBCUTANEOUS | 0 refills | Status: AC
  Filled 2023-09-16: qty 6, 30d supply, fill #0

## 2023-09-16 MED ORDER — PRAVASTATIN SODIUM 40 MG PO TABS
40.0000 mg | ORAL_TABLET | Freq: Every day | ORAL | 0 refills | Status: AC
Start: 2023-09-16 — End: ?
  Filled 2023-09-16: qty 30, 30d supply, fill #0

## 2023-09-16 MED ORDER — LEVOFLOXACIN 750 MG PO TABS
750.0000 mg | ORAL_TABLET | Freq: Every day | ORAL | 0 refills | Status: AC
  Filled 2023-09-16: qty 2, 2d supply, fill #0

## 2023-09-16 NOTE — Progress Notes (Signed)
  [facility] Progress Note    Assessment/ Plan:   Problem List: Hypernatremia, resolved AKI, improved and stable. Peak Cr 5.1, down to 1.3 today. Nonoliguric. Likely related to severe pre-renal injury Newly diagnosed DM2, DKA- per primary Sepsis secondary to pneumonia-per primary Pancreatitis-resolved Unintentional weight loss Anemia Acute metabolic encephalopathy-resolved  Plan/recommendations -Na down to 140. Doubt DI -open for discharge, agreed -recommend follow up labs with PCP, if any concerns then can be referred to us  as an outpatient -unintentional weight loss and anemia work up as outpatient with PCP -nothing else to add for now from a nephrology perspective. Will sign off. Please call with any questions/concerns  Ephriam Stank, MD [facility]  Subjective:   Patient seen and examined bedside. Open for discharge. No complaints. Uop ~2.8L   Objective:   BP 118/75 (BP Location: Left Arm)   Pulse 80   Temp 99.6 F (37.6 C) (Oral)   Resp 20   Ht 5' 10 (1.778 m)   Wt 93.8 kg   SpO2 98%   BMI 29.67 kg/m   Intake/Output Summary (Last 24 hours) at 09/16/2023 1115 Last data filed at 09/16/2023 0800 Gross per 24 hour  Intake 1058.32 ml  Output 2500 ml  Net -1441.68 ml   Weight change: 2.6 kg  Physical Exam: Gen: NAD, sitting up in recliner CVS: RRR Resp: normal wob, unlabored Abd: soft Ext: no sig edema b/l LEs Neuro: awake, alert  Imaging: No results found.  Labs: BMET Recent Labs  Lab 09/10/23 0636 09/11/23 9386 09/12/23 0729 09/12/23 2343 09/14/23 0551 09/15/23 0517 09/16/23 0539  NA 157* 157* 152* 145 147* 147* 140  K 3.5 3.7 3.5 3.6 3.8 3.8 3.5  CL 122* 123* 119* 113* 115* 111 105  CO2 25 24 25 23 24 23 24   GLUCOSE 126* 60* 151* 214* 164* 175* 189*  BUN 30* 27* 19 19 17 18 15   CREATININE 1.75* 1.65* 1.59* 1.49* 1.48* 1.31* 1.31*  CALCIUM  8.8* 8.8* 8.9 8.4* 8.6* 8.9 8.9  PHOS  --  4.2 3.7 3.3 3.3  --   --     CBC Recent Labs  Lab 09/12/23 2343 09/14/23 0551 09/15/23 0517 09/16/23 0555  WBC 22.2* 15.4* 12.5* 12.1*  NEUTROABS 19.3* 11.3* 9.6* 9.8*  HGB 7.8* 7.5* 7.6* 8.4*  HCT 26.0* 25.2* 25.4* 27.3*  MCV 97.0 97.7 96.6 98.2  PLT 216 197 185 193    Medications:     arformoterol   15 mcg Nebulization BID   aspirin   81 mg Oral Daily   budesonide  (PULMICORT ) nebulizer solution  0.5 mg Nebulization BID   Chlorhexidine  Gluconate Cloth  6 each Topical Daily   doxycycline   100 mg Oral Q12H   feeding supplement  237 mL Oral BID BM   heparin  injection (subcutaneous)  5,000 Units Subcutaneous Q8H   insulin  aspart  0-9 Units Subcutaneous TID WC   insulin  glargine-yfgn  20 Units Subcutaneous Daily   insulin  starter kit- pen needles  1 kit Other Once   mouth rinse  15 mL Mouth Rinse 4 times per day   pantoprazole   40 mg Oral Daily      Ephriam Stank, MD [facility] 09/16/2023, 11:15 AM

## 2023-09-16 NOTE — Progress Notes (Addendum)
 AVS completed PIV removed gauze dsg applied.  Skin intact.  TOC medication given to patient. Tele removed and telemetry unit called.   Ride at bedside.

## 2023-09-16 NOTE — Progress Notes (Signed)
 Occupational Therapy Treatment Patient Details Name: Jack Cunningham MRN: [id number] DOB: 1960-05-15 Today's Date: 09/16/2023   History of present illness Pt is a 63 y/o M admitted on 09/02/23 after presenting following his wife finding him on the floor asking for help. Pt found to have DKA, AKI, & sepsis 2/2 PNA/pancreatitis. Pt also being treated for severe dehydration & hypernatremia. PT has been consulted for re-evaluation. PMH: DM2, HTN, asthma, OSA   OT comments  Pt making incremental progress towards OT goals. No pre-syncope noted during session with TED hose donned. Pt able to mobilize in room without AD, manage bathing/dressing/toileting tasks without physical assistance but did need min cues for safety/awareness to decrease fall risk. Encouraged pt to have wife assist with med mgmt, sugar/BP checks at home and donning compression stockings for OOB activity. Pt would benefit from having wife present during DC instructions to maximize carryover/compliance.   BP supine: 122/72 BP sitting EOB: 144/70 BP post activity (> 20 min): 102/76      If plan is discharge home, recommend the following:  Assistance with cooking/housework;Direct supervision/assist for medications management;Direct supervision/assist for financial management;Assist for transportation;Supervision due to cognitive status   Equipment Recommendations  None recommended by OT    Recommendations for Other Services Speech consult    Precautions / Restrictions Precautions Precautions: Fall Precaution/Restrictions Comments: watch BP!, thigh high TED hose OOB Restrictions Weight Bearing Restrictions Per Provider Order: No       Mobility Bed Mobility Overal bed mobility: Modified Independent                  Transfers Overall transfer level: Independent Equipment used: None Transfers: Sit to/from Stand                   Balance Overall balance assessment: Needs assistance Sitting-balance support:  Feet supported Sitting balance-Leahy Scale: Good     Standing balance support: During functional activity, No upper extremity supported Standing balance-Leahy Scale: Fair                             ADL either performed or assessed with clinical judgement   ADL Overall ADL's : Needs assistance/impaired Eating/Feeding: Independent   Grooming: Supervision/safety;Standing;Wash/dry face;Oral care;Set up Grooming Details (indicate cue type and reason): supervision for safety given recent syncope     Lower Body Bathing: Supervison/ safety;Set up       Lower Body Dressing: Supervision/safety;Sitting/lateral leans;Sit to/from stand;Set up;Maximal assistance Lower Body Dressing Details (indicate cue type and reason): able to modify task and opt to sit d/t difficulty managing in standing to don underwear. Max A to don compression stockings - educated on assist to don for OOB activities during the day Toilet Transfer: Supervision/safety;Ambulation   Toileting- Clothing Manipulation and Hygiene: Supervision/safety;Set up;Sit to/from stand;Sitting/lateral lean Toileting - Clothing Manipulation Details (indicate cue type and reason): able to manage hygiene without assist but needed cues to pull up underwear due to attempt to exit bathroom with them around ankle     Functional mobility during ADLs: Supervision/safety      Extremity/Trunk Assessment Upper Extremity Assessment Upper Extremity Assessment: Overall WFL for tasks assessed;Right hand dominant   Lower Extremity Assessment Lower Extremity Assessment: Defer to PT evaluation        Vision   Vision Assessment?: No apparent visual deficits   Perception     Praxis     Communication Communication Communication: No apparent difficulties   Cognition Arousal: Alert Behavior  During Therapy: WFL for tasks assessed/performed Cognition: Cognition impaired     Awareness: Intellectual awareness intact, Online awareness  impaired Memory impairment (select all impairments): Working Civil [employer] fast streamer, Short-term memory Attention impairment (select first level of impairment): Selective attention Executive functioning impairment (select all impairments): Organization, Sequencing OT - Cognition Comments: pleasant, fair recall of info for humor/jokes during session with gradual improvements in short term memory noted. ongoing working memory/attention deficits as pt exiting bathroom but had forgotten to pull underwear up increasing fall risk. Dicussed checking sugar at home, monitoring BP and encouraged pt to have wife present when going over DC instructions - nursing also notified                 Following commands: Intact Following commands impaired: Only follows one step commands consistently, Follows multi-step commands with increased time      Cueing   Cueing Techniques: Verbal cues, Gestural cues  Exercises      Shoulder Instructions       General Comments      Pertinent Vitals/ Pain       Pain Assessment Pain Assessment: No/denies pain  Home Living                                          Prior Functioning/Environment              Frequency  Min 2X/week        Progress Toward Goals  OT Goals(current goals can now be found in the care plan section)  Progress towards OT goals: Progressing toward goals  Acute Rehab OT Goals Patient Stated Goal: go home today OT Goal Formulation: With patient Time For Goal Achievement: 09/20/23 Potential to Achieve Goals: Fair ADL Goals Pt Will Perform Grooming: Independently;standing Pt Will Transfer to Toilet: with modified independence;ambulating Additional ADL Goal #1: Pt to complete further functional cognitive assessments (pill box, etc) Additional ADL Goal #2: Pt to complete 3 step trail making task with no more than min verbal cues  Plan      Co-evaluation                 AM-PAC OT 6 Clicks Daily Activity      Outcome Measure   Help from another person eating meals?: None Help from another person taking care of personal grooming?: A Little Help from another person toileting, which includes using toliet, bedpan, or urinal?: A Little Help from another person bathing (including washing, rinsing, drying)?: A Little Help from another person to put on and taking off regular upper body clothing?: None Help from another person to put on and taking off regular lower body clothing?: A Little 6 Click Score: 20    End of Session    OT Visit Diagnosis: Other symptoms and signs involving cognitive function;Muscle weakness (generalized) (M62.81)   Activity Tolerance Patient tolerated treatment well   Patient Left in chair;with call bell/phone within reach   Nurse Communication Mobility status;Other (comment) (request to have wife present during DC info)        Time: 0728-0809 OT Time Calculation (min): 41 min  Charges: OT General Charges $OT Visit: 1 Visit OT Treatments $Self Care/Home Management : 38-52 mins  Mliss NOVAK, OTR/L Acute Rehab Services Office: [phone]   Mliss Fish 09/16/2023, 8:19 AM

## 2023-09-16 NOTE — Telephone Encounter (Signed)
 Pharmacy Patient Advocate Encounter   Received notification from Inpatient Request that prior authorization for Semglee  100u/ml is required/requested.   Insurance verification completed.   The patient is insured through [employer] .   Per test claim:  Lantus  is preferred by the insurance.  If suggested medication is appropriate, Please send in a new RX and discontinue this one. If not, please advise as to why it's not appropriate so that we may request a Prior Authorization. Please note, some preferred medications may still require a PA.  If the suggested medications have not been trialed and there are no contraindications to their use, the PA will not be submitted, as it will not be approved.  Dr. Lavada Stank okay with switch to Lantus . No further PA needed at this time.

## 2023-09-16 NOTE — Discharge Summary (Signed)
 Jack Cunningham FMW:[id number] DOB: [birth year] DOA: 09/02/2023  PCP: Patient, No Pcp Per  Admit date: 09/02/2023  Discharge date: 09/16/2023  Admitted From: Home   Disposition:  Home   Recommendations for Outpatient Follow-up:   Follow up with PCP in 1-2 weeks  PCP Please obtain BMP/CBC, 2 view CXR in 1week,  (see Discharge instructions)   PCP Please follow up on the following pending results: Recheck CBC, CMP and magnesium in 3 to 4 days.  Arrange for outpatient nephrology follow-up in 1 to 2 weeks   Home Health: PT, SLP   Equipment/Devices: None  Consultations: PCCM, Renal Discharge Condition: Stable    CODE STATUS: Full    Diet Recommendation: Heart Healthy Low Carb    Chief Complaint  Patient presents with   Level 1 Fall     Brief history of present illness from the day of admission and additional interim summary    63 y.o.  male with history of DM-2 (recent diagnosis), HTN, asthma/OSA-who was brought to the hospital by EMS-after they were called by his spouse-when she heard a loud sound-and found the patient on the floor asking for help.  Apparently patient was recently diagnosed with DM-following several weeks of not feeling well and polydipsia.  In the ED-he was lethargic-upon further evaluation-he was found to have DKA, AKI and sepsis secondary to pneumonia/pancreatitis.  He was subsequently admitted to the ICU-stabilized and transferred to [facility] on 7/8.   Significant events: 7/3>> Admit to ICU-DKA 7/4>> remained on insulin  drip, hypernatremia - remained on D5W 7/5>> remained on insulin  drip and D5W due to hypernatremia, overnight spike fever, CT Chest/Abd/Pelv showing pneumonia. Started on zosyn  and linezolid . 7/6>> endotool stopped 7/8>> transferred to [facility] with   Significant studies: 7/3>> CXR: No  PNA. 7/3>> x-ray pelvis: No fracture/dislocation. 7/3>> CT head: No acute intracranial abnormality 7/3>> CT C-spine: No acute intracranial abnormality. 7/4>> renal ultrasound: No hydronephrosis. 7/5>> CT chest/abdomen/pelvis: Lobar consolidation right lower lobe lung.                                                                 Hospital Course   DKA and newly diagnosed DM-2 with uncontrolled hyperglycemia/DKA on presentation (A1c 13.5) with severe dehydration and hypernatremia   Has been now transition to subcu long-acting insulin  and sliding scale, CBG stable, education has been provided.  Sepsis secondary to PNA (POA) Sepsis physiology has resolved Lab significant leukocytosis, CT suggestive of possible ongoing bronchitis, previously had been treated with 7 days of Zosyn  and Zyvox , rechallenge with Levaquin  and doxycycline  and monitor.   Leukocytosis improving.   AKI  due to dehydration, after hydration renal function much improved, PCP to monitor, outpatient nephrology follow-up recommended postdischarge.   Hypernatremia.  Likely due to dehydration and free water deficit improved after D5W, nephrology saw  the patient, follow-up in the outpatient setting with PCP and nephrology postdischarge.   Leukocytosis.  This is acute and reactive, discussed with PCP office on 09/13/2023, 1 month ago had normal creatinine and normal WBC count.  With antibiotics leukocytosis is improving.   Acute metabolic encephalopathy Suspect secondary to DKA/AKI/sepsis/PNA CT negative.  Resolved.  Outpatient PT and speech follow-up.  Long discussion with patient and patient's wife they think he is at his baseline.   Pancreatitis Possibly related to DKA CT abdomen negative for gallstones.  Triglycerides were not elevated. Unclear if her abdominal pain on presentation. Abdomen currently soft-nontender on exam-tolerating initiation of diet.  Outpatient GI follow-up postdischarge   Left arm swelling.  Around  the IV site, IV removed, does have left upper extremity SVT, warm compress and aspirin , IV removed, relatively symptom-free.   HTN BP stable without use of any antihypertensives Losartan remains on hold.   HLD Resume fenofibrate /pravastatin -in next several days.   Bronchial asthma Not in exacerbation Continue bronchodilators.   Of note patient was noncompliant with his home medications for months, counseled on compliance.  Discharge diagnosis     Principal Problem:   DKA (diabetic ketoacidosis) [facility])    Discharge instructions    Discharge Instructions     AMB Referral VBCI Care Management   Complete by: As directed    New diagnosis Diabetes.  High Insulin  requirements.  Needs insulin  for home.  Current A1c= 13.5%  PCP is at [facility].   Expected date of contact: Emergent - 3 Days   Service: Pharmacy   Pharmacy Service For:  Disease Management Medication Adherence     Disease states to manage: Diabetes   Ambulatory referral to Nutrition and Diabetic Education   Complete by: As directed    New diagnosis Diabetes a few weeks ago.  Current A1c= 13.5%.  Needs insulin  for home.   Discharge instructions   Complete by: As directed    Pen if approved   Increase activity slowly   Complete by: As directed        Discharge Medications   Allergies as of 09/16/2023       Reactions   Novocain [procaine] Other (See Comments)   Made patients mouth swell during getting this for a dental procedure and was taken to the ED.        Medication List     STOP taking these medications    glipiZIDE 5 MG tablet Commonly known as: GLUCOTROL   IBUPROFEN PO   losartan 50 MG tablet Commonly known as: COZAAR       TAKE these medications    aspirin  81 MG chewable tablet Chew 1 tablet (81 mg total) by mouth daily.   Blood Glucose Monitoring Suppl Devi 1 each by Does not apply route in the morning, at noon, and at bedtime. May substitute to any manufacturer covered  by patient's insurance.   BLOOD GLUCOSE TEST STRIPS Strp 1 each by In Vitro route in the morning, at noon, and at bedtime. May substitute to any manufacturer covered by patient's insurance.   fenofibrate  48 MG tablet Commonly known as: TRICOR  Take 1 tablet (48 mg total) by mouth daily.   insulin  aspart 100 UNIT/ML FlexPen Commonly known as: NOVOLOG  Before each meal 3 times a day, 140-199 - 2 units, 200-250 - 4 units, 251-299 - 6 units,  300-349 - 8 units,  350 or above 10 units.   insulin  glargine-yfgn 100 UNIT/ML injection Commonly known as: SEMGLEE  Inject 0.2 mLs (20  Units total) into the skin daily.   insulin  starter kit- pen needles Misc 1 kit by Other route once for 1 dose.   Lancet Device Misc 1 each by Does not apply route in the morning, at noon, and at bedtime. May substitute to any manufacturer covered by patient's insurance.   Lancets Misc. Misc 1 each by Does not apply route in the morning, at noon, and at bedtime. May substitute to any manufacturer covered by patient's insurance.   levofloxacin  750 MG tablet Commonly known as: LEVAQUIN  Take 1 tablet (750 mg total) by mouth daily.   metFORMIN 500 MG tablet Commonly known as: GLUCOPHAGE Take 500 mg by mouth 2 (two) times daily.   pantoprazole  40 MG tablet Commonly known as: PROTONIX  Take 1 tablet (40 mg total) by mouth daily.   pravastatin  40 MG tablet Commonly known as: PRAVACHOL  Take 1 tablet (40 mg total) by mouth daily.   Symbicort  160-4.5 MCG/ACT inhaler Generic drug: budesonide -formoterol  Inhale 2 puffs into the lungs.   Vitamin D (Ergocalciferol) 1.25 MG (50000 UNIT) Caps capsule Commonly known as: DRISDOL Take 50,000 Units by mouth once a week. No set day to take it.               Durable Medical Equipment  (From admission, onward)           Start     Ordered   09/08/23 1218  For home use only DME Walker rolling  Once       Question Answer Comment  Walker: With 5 Inch Wheels    Patient needs a walker to treat with the following condition Generalized weakness      09/08/23 1218             Follow-up Information     Emerick Avelina POUR, PA-C Follow up.   Specialty: Physician Assistant Why: TIME :  2:20 PM   PLEASE ARRIVE AT 2PM DATE :  JULY 22 , 2025  PLEASE BRING ALL CURRENT MEDICATION, ID and INS CARD Contact information: [address] [place] [address] [phone]         [facility] at [facility] Follow up.   Specialty: Rehabilitation Why: Call when you are discharged for faster scheduling. A referral has been placed electronically for you. Contact information: [address] [place]  [address] [phone]        Dennise Hoes, MD. Schedule an appointment as soon as possible for a visit in 1 week(s).   Specialty: Nephrology Contact information: [address] [place] [address] [phone]                 Major procedures and Radiology Reports - PLEASE review detailed and final reports thoroughly  -       VAS US  UPPER EXTREMITY VENOUS DUPLEX Result Date: 09/13/2023 UPPER VENOUS STUDY  Patient Name:  Jack Cunningham  Date of Exam:   09/13/2023 Medical Rec #: [id number]         Accession #:    [id number] Date of Birth: November 01, 1960          Patient Gender: M Patient Age:   40 years Exam Location:  [facility] Procedure:      VAS US  UPPER EXTREMITY VENOUS DUPLEX Referring Phys: LAVADA [facility] --------------------------------------------------------------------------------  Indications: Swelling Performing Technologist: Elmarie Lindau, RVT  Examination Guidelines: A complete evaluation includes B-mode imaging, spectral Doppler, color Doppler, and power Doppler as needed of all accessible portions of each  vessel. Bilateral testing is considered an integral part of a complete examination. Limited examinations for reoccurring indications may be performed as  noted.  Right Findings: +----------+------------+---------+-----------+----------+-------+ RIGHT     CompressiblePhasicitySpontaneousPropertiesSummary +----------+------------+---------+-----------+----------+-------+ Subclavian               Yes                                +----------+------------+---------+-----------+----------+-------+  Left Findings: +----------+------------+---------+-----------+----------+---------------+ LEFT      CompressiblePhasicitySpontaneousProperties    Summary     +----------+------------+---------+-----------+----------+---------------+ IJV           Full                                                  +----------+------------+---------+-----------+----------+---------------+ Subclavian    Full                                                  +----------+------------+---------+-----------+----------+---------------+ Axillary      Full                                                  +----------+------------+---------+-----------+----------+---------------+ Brachial      Full                                                  +----------+------------+---------+-----------+----------+---------------+ Radial        Full                                                  +----------+------------+---------+-----------+----------+---------------+ Ulnar         Full                                                  +----------+------------+---------+-----------+----------+---------------+ Cephalic      None                                       Acute      +----------+------------+---------+-----------+----------+---------------+ Basilic       Full                                  Thickened walls +----------+------------+---------+-----------+----------+---------------+ The left cephalic is not seen in the upper arm, however there is acute appearing, noncompressible thrombus in the median cubital vein into the  cephalic vein throughout the forearm.  Summary:  Right: No evidence of thrombosis in the subclavian.  Left: Findings consistent with acute superficial vein thrombosis involving the  left cephalic vein. Acute superficial vein thrombosis in the median cubital vein.  *See table(s) above for measurements and observations.  Diagnosing physician: Fonda Rim Electronically signed by Fonda Rim on 09/13/2023 at 6:56:53 PM.    Final    CT CHEST ABDOMEN PELVIS WO CONTRAST Result Date: 09/12/2023 CLINICAL DATA:  Sepsis EXAM: CT CHEST, ABDOMEN AND PELVIS WITHOUT CONTRAST TECHNIQUE: Multidetector CT imaging of the chest, abdomen and pelvis was performed following the standard protocol without IV contrast. RADIATION DOSE REDUCTION: This exam was performed according to the departmental dose-optimization program which includes automated exposure control, adjustment of the mA and/or kV according to patient size and/or use of iterative reconstruction technique. COMPARISON:  09/04/2023 FINDINGS: CT CHEST FINDINGS Cardiovascular: Atherosclerotic calcification of the aortic arch and left anterior descending coronary artery. Mediastinum/Nodes: Unremarkable Lungs/Pleura: Biapical pleuroparenchymal scarring. Airway thickening is present, suggesting bronchitis or reactive airways disease. The previous consolidation of volume loss in the right lower lobe has substantially improved. There is currently extensive tree-in-bud nodularity in the peribronchovascular region of the right lower lobe favoring atypical infectious bronchiolitis. Lesser similar findings in the right middle lobe. Musculoskeletal: Degenerative right sternoclavicular arthropathy. Mild thoracic spondylosis. CT ABDOMEN PELVIS FINDINGS Hepatobiliary: Unremarkable unremarkable tech contracted gallbladder. Otherwise unremarkable. Pancreas: Unremarkable Spleen: Unremarkable Adrenals/Urinary Tract: Right posterior bladder diverticulum. Otherwise unremarkable. Stomach/Bowel:  Unremarkable Vascular/Lymphatic: Atherosclerosis is present, including aortoiliac atherosclerotic disease. Reproductive: Unremarkable Other: No supplemental non-categorized findings. Musculoskeletal: Unremarkable IMPRESSION: 1. Improved aeration in the right lower lobe, currently with substantial tree-in-bud nodularity in the peribronchovascular region of the right lower lobe favoring atypical infectious bronchiolitis. Lesser similar findings in the right middle lobe. 2. Airway thickening is present, suggesting bronchitis or reactive airways disease. 3. Right posterior bladder diverticulum. 4.  Aortic Atherosclerosis (ICD10-I70.0). Electronically Signed   By: Ryan Salvage M.D.   On: 09/12/2023 16:35   DG Chest Port 1 View Result Date: 09/11/2023 CLINICAL DATA:  Shortness of breath. EXAM: PORTABLE CHEST 1 VIEW COMPARISON:  Radiograph and CT 09/04/2023 FINDINGS: Re-expansion of the right lower lobe with improved aeration. Mild residual ill-defined opacity. No new airspace disease. Stable heart size and mediastinal contours. No pulmonary edema, large pleural effusion or pneumothorax. Stable heart size and mediastinal contours. IMPRESSION: Re-expansion of the right lower lobe with improved aeration. Mild residual ill-defined opacity. Electronically Signed   By: Andrea Gasman M.D.   On: 09/11/2023 15:34   CT CHEST ABDOMEN PELVIS WO CONTRAST Result Date: 09/04/2023 CLINICAL DATA:  New fever in a patient with history of pancreatitis. EXAM: CT CHEST, ABDOMEN AND PELVIS WITHOUT CONTRAST TECHNIQUE: Multidetector CT imaging of the chest, abdomen and pelvis was performed following the standard protocol without IV contrast. RADIATION DOSE REDUCTION: This exam was performed according to the departmental dose-optimization program which includes automated exposure control, adjustment of the mA and/or kV according to patient size and/or use of iterative reconstruction technique. COMPARISON:  Chest radiograph  09/04/2023.  CT chest 06/24/2017 FINDINGS: CT CHEST FINDINGS Cardiovascular: Normal heart size. No pericardial effusions. Normal caliber thoracic aorta. Mild calcification of the aorta and coronary arteries. Mediastinum/Nodes: Thyroid gland is unremarkable. Esophagus is decompressed. No significant lymphadenopathy. Lungs/Pleura: Lobar consolidation and volume loss of right lower lobe, new since prior study. Air bronchograms are present. This is likely to represent consolidative pneumonia although occult endobronchial obstructing lesion is not excluded and follow-up to resolution is recommended. There are additional patchy areas of tree-in-bud infiltration in the peribronchial region, greater on the right, with peribronchial thickening suggesting airways disease or bronchopneumonia. No  focal consolidation on the left. No pleural effusion or pneumothorax. Musculoskeletal: No chest wall mass or suspicious bone lesions identified. CT ABDOMEN PELVIS FINDINGS Hepatobiliary: No focal liver abnormality is seen. No gallstones, gallbladder wall thickening, or biliary dilatation. Pancreas: Unremarkable. No pancreatic ductal dilatation or surrounding inflammatory changes. Spleen: Normal in size without focal abnormality. Adrenals/Urinary Tract: Adrenal glands are unremarkable. Kidneys are normal, without renal calculi, focal lesion, or hydronephrosis. Bladder is decompressed with a Foley catheter in place. Stomach/Bowel: Stomach is within normal limits. Appendix appears normal. No evidence of bowel wall thickening, distention, or inflammatory changes. Motion artifact limits examination. Vascular/Lymphatic: Aortic atherosclerosis. No enlarged abdominal or pelvic lymph nodes. Reproductive: Prostate gland is enlarged. Other: No abdominal wall hernia or abnormality. No abdominopelvic ascites. Musculoskeletal: No acute or significant osseous findings. IMPRESSION: 1. Lobar consolidation and volume loss with air bronchograms in the  right lower lung likely representing consolidative pneumonia. An occult endobronchial obstructing lesion is not excluded and follow-up to resolution is recommended. 2. Additional bronchial wall thickening with tree-in-bud peribronchial infiltrates likely correspond airways disease or bronchopneumonia. 3. No acute process demonstrated in the abdomen or pelvis. No evidence of acute pancreatitis. 4. Aortic atherosclerosis. 5. Foley catheter decompresses the bladder. 6. Prostate gland is enlarged. Electronically Signed   By: Elsie Gravely M.D.   On: 09/04/2023 22:17   DG CHEST PORT 1 VIEW Result Date: 09/04/2023 CLINICAL DATA:  Fever EXAM: PORTABLE CHEST 1 VIEW COMPARISON:  09/02/2023 FINDINGS: Heart mediastinal contours are within normal limits. Right basilar opacity. Left lung clear. No effusions. No acute bony abnormality. IMPRESSION: Right lower lobe airspace opacity concerning for pneumonia. Electronically Signed   By: Franky Crease M.D.   On: 09/04/2023 21:20   US  RENAL Result Date: 09/03/2023 CLINICAL DATA:  Hydronephrosis.  Acute renal injury. EXAM: RENAL / URINARY TRACT ULTRASOUND COMPLETE COMPARISON:  None Available. FINDINGS: Right Kidney: Renal measurements: 11.1 x 5.6 x 5.6 cm = volume: 182 mL. Echogenicity within normal limits. No mass or hydronephrosis visualized. Left Kidney: Renal measurements: 8.9 x 5.7 x 4.9 = volume: 130 mL. Echogenicity within normal limits. No mass or hydronephrosis visualized. Bladder: Bladder collapsed around Foley catheter. Other: Difficulty maneuvering patient.  Patient not arousable IMPRESSION: 1. No hydronephrosis. 2. Bladder collapsed around Foley catheter. Electronically Signed   By: Jackquline Boxer M.D.   On: 09/03/2023 12:42   CT Cervical Spine Wo Contrast Result Date: 09/02/2023 CLINICAL DATA:  63 year old male status post fall.  Weakness. EXAM: CT CERVICAL SPINE WITHOUT CONTRAST TECHNIQUE: Multidetector CT imaging of the cervical spine was performed without  intravenous contrast. Multiplanar CT image reconstructions were also generated. RADIATION DOSE REDUCTION: This exam was performed according to the departmental dose-optimization program which includes automated exposure control, adjustment of the mA and/or kV according to patient size and/or use of iterative reconstruction technique. COMPARISON:  Head CT today. FINDINGS: Alignment: Straightening of cervical lordosis. Cervicothoracic junction alignment is within normal limits. Bilateral posterior element alignment is within normal limits. Skull base and vertebrae: Bone mineralization is within normal limits. Visualized skull base is intact. No atlanto-occipital dissociation. C1 and C2 appear intact and aligned. No acute osseous abnormality identified. Soft tissues and spinal canal: No prevertebral fluid or swelling. No visible canal hematoma. Right nasal airway in place with no adverse features. Elongated bilateral stylohyoid ligament calcification (can predispose to Eagle syndrome). Negative other visible noncontrast neck soft tissues. Disc levels:  Ordinary cervical spine degeneration, age-appropriate. Upper chest: Visible upper thoracic levels appear intact. Lung apices are clear.  Negative visible noncontrast thoracic inlet. IMPRESSION: No acute traumatic injury identified. Negative for age CT appearance of the cervical spine. Study discussed by telephone with Dr. Dann Hummer on 09/02/2023 at 09:37 . Electronically Signed   By: VEAR Hurst M.D.   On: 09/02/2023 09:38   CT Head Wo Contrast Result Date: 09/02/2023 CLINICAL DATA:  63 year old male status post fall. Weakness. EXAM: CT HEAD WITHOUT CONTRAST TECHNIQUE: Contiguous axial images were obtained from the base of the skull through the vertex without intravenous contrast. RADIATION DOSE REDUCTION: This exam was performed according to the departmental dose-optimization program which includes automated exposure control, adjustment of the mA and/or kV according to  patient size and/or use of iterative reconstruction technique. COMPARISON:  None Available. FINDINGS: Brain: Cerebral volume is within normal limits for age. No midline shift, ventriculomegaly, mass effect, evidence of mass lesion, intracranial hemorrhage or evidence of cortically based acute infarction. Gray-white matter differentiation is largely normal for age, minimal periventricular white matter hypodensity. Vascular: Calcified atherosclerosis at the skull base. No suspicious intracranial vascular hyperdensity. Skull: Intact.  No acute osseous abnormality identified. Sinuses/Orbits: Right nasal airway in place. Paranasal sinuses and mastoids are well aerated. Other: No acute orbit or scalp soft tissue injury identified. IMPRESSION: No acute intracranial abnormality or acute traumatic injury identified. Right nasal airway in place. Electronically Signed   By: VEAR Hurst M.D.   On: 09/02/2023 09:34   DG Pelvis Portable Result Date: 09/02/2023 CLINICAL DATA:  63 year old male status post fall. Weakness. EXAM: PORTABLE PELVIS 1-2 VIEWS COMPARISON:  None Available. FINDINGS: AP view of the pelvis at 0914 hours. Bone mineralization is within normal limits for age. Femoral heads normally located. Pelvis appears intact. SI joints and pubic symphysis appear normal. Grossly intact proximal femurs. Normal visible bowel gas pattern. Dystrophic and/or vascular calcifications incidentally noted below the right pubic rami. IMPRESSION: No acute fracture or dislocation identified about the pelvis. Electronically Signed   By: VEAR Hurst M.D.   On: 09/02/2023 09:34   DG Chest Port 1 View Result Date: 09/02/2023 CLINICAL DATA:  63 year old male status post fall. Weakness. EXAM: PORTABLE CHEST 1 VIEW COMPARISON:  Chest radiographs 04/27/2018 and earlier. FINDINGS: Portable AP views at 0912 hours. Stable lung volumes. Normal cardiac size and mediastinal contours. Visualized tracheal air column is within normal limits. Allowing for  portable technique the lungs are clear. No pneumothorax or pleural effusion identified. Negative visible bowel gas. No acute osseous abnormality identified. IMPRESSION: No acute cardiopulmonary abnormality or acute traumatic injury identified. Electronically Signed   By: VEAR Hurst M.D.   On: 09/02/2023 09:33    Micro Results    Recent Results (from the past 240 hours)  Culture, blood (Routine X 2) w Reflex to ID Panel     Status: None (Preliminary result)   Collection Time: 09/12/23  6:14 PM   Specimen: BLOOD  Result Value Ref Range Status   Specimen Description BLOOD BLOOD RIGHT ARM  Final   Special Requests   Final    BOTTLES DRAWN AEROBIC AND ANAEROBIC Blood Culture results may not be optimal due to an inadequate volume of blood received in culture bottles   Culture   Final    NO GROWTH 4 DAYS Performed at [facility], [address], [place], [place] [address]    Report Status PENDING  Incomplete  Culture, blood (Routine X 2) w Reflex to ID Panel     Status: None (Preliminary result)   Collection Time: 09/12/23 11:43 PM  Specimen: BLOOD  Result Value Ref Range Status   Specimen Description BLOOD RIGHT HAND  Final   Special Requests   Final    BOTTLES DRAWN AEROBIC AND ANAEROBIC Blood Culture results may not be optimal due to an inadequate volume of blood received in culture bottles   Culture   Final    NO GROWTH 3 DAYS Performed at [facility], [address] [place], [place] [address]    Report Status PENDING  Incomplete    Today   Subjective    Jack Cunningham today has no headache,no chest abdominal pain,no new weakness tingling or numbness, feels much better wants to go home today.    Objective   Blood pressure (!) 108/55, pulse 78, temperature 98.2 F (36.8 C), temperature source Oral, resp. rate 15, height 5' 10 (1.778 m), weight 93.8 kg, SpO2 97%.   Intake/Output Summary (Last 24 hours) at 09/16/2023 9297 Last data filed at 09/16/2023  0534 Gross per 24 hour  Intake 1298.32 ml  Output 2800 ml  Net -1501.68 ml    Exam  Awake Alert, No new F.N deficits,    [place].AT,PERRAL Supple Neck,   Symmetrical Chest wall movement, Good air movement bilaterally, CTAB RRR,No Gallops,   +ve B.Sounds, Abd Soft, Non tender,  No Cyanosis, Clubbing or edema    Data Review   Recent Labs  Lab 09/11/23 0613 09/12/23 0729 09/12/23 2343 09/14/23 0551 09/15/23 0517  WBC 21.0* 23.0* 22.2* 15.4* 12.5*  HGB 8.6* 9.0* 7.8* 7.5* 7.6*  HCT 28.8* 29.7* 26.0* 25.2* 25.4*  PLT 221 254 216 197 185  MCV 97.3 96.7 97.0 97.7 96.6  MCH 29.1 29.3 29.1 29.1 28.9  MCHC 29.9* 30.3 30.0 29.8* 29.9*  RDW 12.4 12.4 12.1 12.4 13.7  LYMPHSABS 2.3 2.8 2.4 1.9 1.4  MONOABS 0.8 1.1* 0.2 0.7 0.6  EOSABS 0.6* 0.4 0.2 0.2 0.2  BASOSABS 0.0 0.1 0.0 0.1 0.1    Recent Labs  Lab 09/11/23 0613 09/12/23 0729 09/12/23 2343 09/14/23 0551 09/15/23 0517 09/16/23 0539  NA 157* 152* 145 147* 147* 140  K 3.7 3.5 3.6 3.8 3.8 3.5  CL 123* 119* 113* 115* 111 105  CO2 24 25 23 24 23 24   ANIONGAP 10 8 9 8 13 11   GLUCOSE 60* 151* 214* 164* 175* 189*  BUN 27* 19 19 17 18 15   CREATININE 1.65* 1.59* 1.49* 1.48* 1.31* 1.31*  CRP 1.0* 0.7 0.6 0.6 0.5  --   PROCALCITON 6.98 4.29 2.73 1.02 0.57  --   MG 2.2 2.1 1.9 1.9  --   --   PHOS 4.2 3.7 3.3 3.3  --   --   CALCIUM  8.8* 8.9 8.4* 8.6* 8.9 8.9    Total Time in preparing paper work, data evaluation and todays exam - 35 minutes  Signature  -    Lavada Stank M.D on 09/16/2023 at 7:02 AM   -  To page go to [email or web address]

## 2023-09-16 NOTE — Plan of Care (Signed)

## 2023-09-16 NOTE — TOC Transition Note (Signed)
 Transition of Care [facility]) - Discharge Note   Patient Details  Name: Jack Cunningham MRN: [id number] Date of Birth: 1961/01/09  Transition of Care [facility]) CM/SW Contact:  Corean JAYSON Canary, RN Phone Number: 09/16/2023, 8:36 AM   Clinical Narrative:     Spoke to the patient at bedside, walker present in the room. No other needs, patient will be discharged and picked up by spouse.   Final next level of care: OP Rehab Barriers to Discharge: No Barriers Identified   Patient Goals and CMS Choice            Discharge Placement                       Discharge Plan and Services Additional resources added to the After Visit Summary for       Post Acute Care Choice: Durable Medical Equipment, Home Health          DME Arranged: Walker rolling DME Agency: [employer] Date DME Agency Contacted: 09/08/23 Time DME Agency Contacted: 1226 Representative spoke with at DME Agency: Thomasina            Social Drivers of Health (SDOH) Interventions SDOH Screenings   Food Insecurity: No Food Insecurity (09/03/2023)  Housing: Low Risk  (09/03/2023)  Transportation Needs: No Transportation Needs (09/03/2023)  Utilities: Not At Risk (09/03/2023)     Readmission Risk Interventions     No data to display

## 2023-09-16 NOTE — Plan of Care (Signed)
 Pt has rested quietly throughout the night with no distress noted. Alert and oriented. On room air. SR on the monitor. Voids per urinal. No complaints voiced.     Problem: Education: Goal: Knowledge of General Education information will improve Description: Including pain rating scale, medication(s)/side effects and non-pharmacologic comfort measures Outcome: Progressing   Problem: Clinical Measurements: Goal: Respiratory complications will improve Outcome: Progressing Goal: Cardiovascular complication will be avoided Outcome: Progressing   Problem: Activity: Goal: Risk for activity intolerance will decrease Outcome: Progressing   Problem: Pain Managment: Goal: General experience of comfort will improve and/or be controlled Outcome: Progressing

## 2023-09-16 NOTE — Inpatient Diabetes Management (Signed)
 Inpatient Diabetes Program Recommendations  AACE/ADA: New Consensus Statement on Inpatient Glycemic Control (2015)  Target Ranges:  Prepandial:   less than 140 mg/dL      Peak postprandial:   less than 180 mg/dL (1-2 hours)      Critically ill patients:  140 - 180 mg/dL   Lab Results  Component Value Date   GLUCAP 146 (H) 09/16/2023   HGBA1C 13.5 (H) 09/02/2023    Review of Glycemic Control  Latest Reference Range & Units 09/14/23 21:14 09/15/23 08:28 09/15/23 11:55 09/15/23 16:41 09/15/23 21:32 09/16/23 09:00  Glucose-Capillary 70 - 99 mg/dL 837 (H) 862 (H) 850 (H) 286 (H) 206 (H) 146 (H)  (H): Data is abnormally high Diabetes history: DM2, recent diagnosis Outpatient Diabetes medications: metformin 500 BID, glipizide 5 daily Current orders for Inpatient glycemic control: Semglee  20 daily, Novolog  0-9 TID  Inpatient Diabetes Program Recommendations:    RN reached out regarding patient difficulty with AM injection.   Spoke with patient regarding insulin  pen teaching and administration. Patient able to verbalize how to administer, seems it was patient's first time with self injection and was using syringe.  Reviewed target goals, importance of protein, minimizing CHO intake, hyper vs hypoglycemia, interventions, differences between long acting and short acting insulin , when to take, CGM, meter, when to follow up with PCP, vascular changes and commorbidities.  Educated patient and spouse (by phone) on insulin  pen use at home. Reviewed contents of insulin  flexpen starter kit. Reviewed all steps if insulin  pen including attachment of needle, 2-unit air shot, dialing up dose, giving injection, removing needle, disposal of sharps, storage of unused insulin , disposal of insulin  etc. Patient able to provide successful return demonstration. Also reviewed troubleshooting with insulin  pen. MD to give patient Rxs for insulin  pens and insulin  pen needles. Patient seems overwhelmed , but appropriate.  Not wanting to apply CGM at this time. Feel it may be more appropriate to familiarize self with meter then can progress to CGM.  Questions answered.  Will place outpatient referral for education and RPH.   Discharge Recommendations: Long acting recommendations: Insulin  Glargine (LANTUS ) Solostar Pen Dose TBD at time of discharge by MD  Short acting recommendations:  Meal coverage ONLY Insulin  aspart (NOVOLOG ) FlexPen  Dose TBD at time of discharge by MD   Supply/Referral recommendations: Glucometer Test strips Lancet device Lancets Pen needles - standard Freestyle Libre 3 plus Glucose Sensors--please give 4 refills (Order number [phone]) + Freestyle Libre 3 Reader (Order number [id number])   Use Adult Diabetes Insulin  Treatment Post Discharge order set.   Thanks, Tinnie Minus, MSN, RNC-OB Diabetes Coordinator [phone] (8a-5p)

## 2023-09-17 ENCOUNTER — Telehealth: Payer: Self-pay

## 2023-09-17 LAB — CULTURE, BLOOD (ROUTINE X 2): Culture: NO GROWTH

## 2023-09-17 NOTE — Progress Notes (Signed)
 Attempted to outreach patient for DM TOC follow-up. VM full. Will re-attempt outreach again next week.   Lorain Baseman, PharmD [facility] [phone]

## 2023-09-17 NOTE — Progress Notes (Signed)
 Diabetes Transitions of Care -   Patient referred to [facility] team due to uncontrolled diabetes on admission. Patient's primary care provider is located at [facility].   Patient was recently admitted from 09/02/23 to 09/16/23 at [facility] for sepsis 2/2 pneumonia and pancreatitis. He was also found to have an A1C of 13.5% in the setting of newly diagnosed diabetes. He was instructed to start insulin  glargine 20 units daily and insulin  aspart before each meal per sliding scale. He was counseled by the diabetes educator on insulin  pen administration over the phone while admitted.   Unsuccessful outreach to  patient to discuss diabetes control and medication management  Follow-up:   PCP follow-up 09/21/23 Pharmacist follow-up not indicated - non  PCP  Lorain Baseman, PharmD [facility] [phone]

## 2023-09-18 LAB — CULTURE, BLOOD (ROUTINE X 2): Culture: NO GROWTH

## 2023-09-22 NOTE — Therapy (Signed)
 OUTPATIENT PHYSICAL THERAPY THORACOLUMBAR EVALUATION   Patient Name: Jack Cunningham MRN: [id number] DOB:1960-04-05, 63 y.o., male Today's Date: 09/23/2023  END OF SESSION:  PT End of Session - 09/23/23 0934     Visit Number 1    Number of Visits 13    Date for PT Re-Evaluation 11/04/23    Authorization Type [employer]  BERG/ PSFS    PT Start Time 0933    PT Stop Time 1020    PT Time Calculation (min) 47 min    Equipment Utilized During Treatment Gait belt    Activity Tolerance Patient tolerated treatment well;Patient limited by pain;Patient limited by fatigue    Behavior During Therapy [facility] for tasks assessed/performed          Past Medical History:  Diagnosis Date   Asthma    COPD (chronic obstructive pulmonary disease) (HCC)    Myocardial infarction (HCC)    1981 at age 4   Pre-diabetes    Sleep apnea    No CPAP   Past Surgical History:  Procedure Laterality Date   no surgeries as of 07/21/19     TOOTH EXTRACTION N/A 08/30/2020   Procedure: DENTAL RESTORATION/EXTRACTIONS;  Surgeon: Sheryle Hamilton, DMD;  Location: [facility] OR;  Service: Oral Surgery;  Laterality: N/A;   Patient Active Problem List   Diagnosis Date Noted   DKA (diabetic ketoacidosis) (HCC) 09/02/2023   COPD (chronic obstructive pulmonary disease) (HCC) 06/24/2017   COPD exacerbation (HCC) 06/23/2017    PCP: Kahoano, [name], MD   REFERRING PROVIDER: Dennise Lavada POUR, MD   REFERRING DIAG: R53.1 (ICD-10-CM) - Weakness   Rationale for Evaluation and Treatment: Rehabilitation  THERAPY DIAG:  Muscle weakness (generalized) - Plan: PT plan of care cert/re-cert  Unsteadiness on feet - Plan: PT plan of care cert/re-cert  Acute pain of right knee - Plan: PT plan of care cert/re-cert  Other abnormalities of gait and mobility - Plan: PT plan of care cert/re-cert  ONSET DATE: September 02, 2023  DKA  SUBJECTIVE:                                                                                                                                                                                            SUBJECTIVE STATEMENT: I am here because of my Diabetes and weakness. I have poor vision and I have left knee numbness and I have weakness in both my legs. I am feeling a bit off in my balance and I have overall weakness and I would like to get stronger. I was in the hosptal September 02, 2023 with Diabetic Ketoacidosis for 7 days.  I feel weak and not able to shop at [employer] like I used to.  I have a dtr with autism and I need to help and care for her and she is about 50lb and I cannot do it right now. I am afraid that Iwill fall. I have fallen twice when I ' blacked  out, but I think my DM medication is better now.  PERTINENT HISTORY:  MI (in high school), COPD. DM, Sleep apnea, Asthma  PAIN:  Are you having pain? Yes: NPRS scale: at rest 5/10 at worst at night 8/10 Pain location: right knee pain Pain description: aching  Aggravating factors: lying in bed at night, stand longer than 15 minutes.  Relieving factors: resting  PRECAUTIONS: Other: Novocain  RED FLAGS: None   WEIGHT BEARING RESTRICTIONS: No  FALLS:  Has patient fallen in last 6 months? Yes. 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  DIAGNOSTIC FINDINGS:  See medical chart  PATIENT SURVEYS:  PSFS: THE PATIENT SPECIFIC FUNCTIONAL SCALE  Place score of 0-10 (0 = unable to perform activity and 10  = able to perform activity at the same level as before injury or problem)  Activity Date: 09-23-23    Shopping in grocery store for 30 minutes without fatigue       7    2. Mowing the [place]        6    3.Pick up 50 lb autistic dtr       5    4.      Total Score 18/30      Total Score = Sum of activity scores/number of activities  Minimally Detectable Change: 3 points (for single activity); 2 points (for average score)  [name] (nd). The Patient Specific Functional Scale . Retrieved from [email or web address]    COGNITION: Overall cognitive status: Within functional limits for tasks assessed     SENSATION: Pt reports numbness over left thigh to knee and and also over right thigh to knee. Pt reports no numbess in bil feet  MUSCLE LENGTH: Hamstrings: bil tightness Thomas test: Right 15 deg; Left 10 deg  POSTURE: rounded shoulders, forward head, and decreased lumbar lordosis  PALPATION: TTP over medial knee  LUMBAR ROM:   AROM eval  Flexion Fingertips to lower shin  Extension WNL  Right lateral flexion WNL  Left lateral flexion WNL  Right rotation WNL  Left rotation WNL  Key: WFL = within functional limits not formally assessed, * = concordant pain, s = stiffness/stretching sensation, NT = not tested)    LOWER EXTREMITY ROM:     Active  Right eval Left eval  Hip flexion    Hip extension    Hip abduction    Hip adduction    Hip internal rotation    Hip external rotation    Knee flexion 124  130  Knee extension 0 0  Ankle dorsiflexion    Ankle plantarflexion  Ankle inversion    Ankle eversion    Key: WFL = within functional limits not formally assessed, * = concordant pain, s = stiffness/stretching sensation, NT = not tested)     LOWER EXTREMITY MMT:    MMT Right eval Left eval  Hip flexion 4+ 4+  Hip extension 4 4  Hip abduction 4 4  Hip adduction    Hip internal rotation    Hip  external rotation    Knee flexion 4 with pain 5  Knee extension 4 5  Ankle dorsiflexion    Ankle plantarflexion 14/25 18/25  Ankle inversion    Ankle eversion    (Blank rows = not tested, score listed is out of 5 possible points.  N = WNL, D = diminished, C = clear for gross weakness with myotome testing, * = concordant pain with testing)     FUNCTIONAL TESTS:  5 times sit to stand: 20.27 sec 6 minute walk test: 1088 Berg Balance Scale:  Item Test date: 09-23-23 Test date:  Test date:   Sitting to standing 4. able to stand without using hands and stabilize independently Insert OPRCBERGREEVAL SmartPhrase at re-test date Insert OPRCBERGREEVAL SmartPhrase at re-test date  2. Standing unsupported 4. able to stand safely for 2 minutes    3. Sitting with back unsupported, feet supported 4. able to sit safely and securely for 2 minutes    4. Standing to sitting 4. sits safely with minimal use of hands    5. Pivot transfer  4. able to transfer safely with minor use of hands    6. Standing unsupported with eyes closed 4. able to stand 10 seconds safely    7. Standing unsupported with feet together 3. able to place feet together independently and stand 1 minute with supervision    8. Reaching forward with outstretched arms while standing 4. can reach forward confidently 25 cm (10 inches)    9. Pick up object from the floor from standing 4. able to pick up slipper safely and easily    10. Turning to look behind over left and right shoulders while standing 4. looks behind from both sides and weight shifts well    11. Turn 360 degrees 4. able to turn 360 degrees safely in 4 seconds or less    12. Place alternate foot on step or stool while standing unsupported 4. ble to stand independently and safely and complete 8 steps in 20 seconds    13. Standing unsupported one foot in front 1. needs help to step but can hold 15 seconds    14. Standing on one leg 3. able to lift leg independently and hold 5-10  seconds     Total Score 47/56 Total Score /56 Total Score /56     GAIT: Distance walked: 1088 ft (1830-2205 ft Norm) Assistive device utilized: None Level of assistance: Complete Independence Comments: Pt with some right knee pain and slow and careful with unsteadiness on feet  TREATMENT DATE: 09-23-23  Eval and issue of HEP/ fall prevention  PATIENT EDUCATION:  Education details: POC Explanation of findings, issue HEP and fall prevention Person educated: Patient Education method: Explanation, Demonstration, Tactile cues, Verbal cues, and Handouts Education comprehension: verbalized understanding, returned demonstration, verbal cues required, tactile cues required, and needs further education  HOME EXERCISE PROGRAM: Access Code: [email or web address] URL: [email or web address] Date: 09/23/2023 Prepared by: Graydon Dingwall  Exercises - Supine Lower Trunk Rotation  - 2 x daily - 7 x weekly - 1 sets - 5 reps - 20 hold - Seated Hamstring Stretch  - 2 x daily - 7 x weekly - 1 sets - 2 reps - 20-30 sec hold - Sit to stand with sink support Movement snack  - 3 x daily - 7 x weekly - 1 sets - 10 reps - Seated Knee Extension with Resistance  - 1 x daily - 7 x weekly - 3 sets - 10 reps - Standing Heel Raise with Support  - 1 x daily - 7 x weekly - 3 sets - 10 reps  ASSESSMENT:  CLINICAL IMPRESSION: Patient is a 63 y.o. male who was seen today for physical therapy evaluation and treatment for generalized weakness following DKA episode with hospitalization.  Pt also with Right acute medial knee pain and some bil numbness bil since hospitalization. Pt with balance deficits of BERG 47/56, and functional weakness noted on PF heel raise. Pt with unsteadiness and fatigue and will benefit from skilled PT to address impairment and return to PLOF  with safety and  strength to care for autistic dtr weighing 50 #.   OBJECTIVE IMPAIRMENTS: decreased activity tolerance, decreased balance, decreased endurance, decreased knowledge of condition, difficulty walking, decreased ROM, decreased strength, impaired sensation, improper body mechanics, and pain.   ACTIVITY LIMITATIONS: carrying, lifting, standing, squatting, stairs, transfers, locomotion level, and caring for others  PARTICIPATION LIMITATIONS: cleaning, shopping, community activity, yard work, and caring for autistic dtr  PERSONAL FACTORS: MI (in high school), COPD. DM, Sleep apnea, Asthma are also affecting patient's functional outcome.   REHAB POTENTIAL: Good  CLINICAL DECISION MAKING: Evolving/moderate complexity  EVALUATION COMPLEXITY: Moderate   GOALS: Goals reviewed with patient? Yes  SHORT TERM GOALS: Target date: 10-14-23  Pt will be I with initial HEP Baseline: no knowledge Goal status: INITIAL  2.   Pt will perform 5xSTS in <16 sec in order to demonstrate reduced fall risk and improved functional independence. (MCID of 2.3sec) Baseline: 20.27 sec Goal status: INITIAL  3.  Pt will be educated on fall preparedness Baseline: no knowledge Goal status: INITIAL    LONG TERM GOALS: Target date: 11-04-23  Pt will be I with advanced HEP Baseline: no knowledge Goal status: INITIAL  2.  Pt will show improved ability to walk for exercise by improving within norms for age Baseline: 1088 ft Norm 1830- 2205 ft Goal status: INITIAL  3.  Pt will be able to shop at [facility] for 30 minutes without fatiguing Baseline: unable to shop due to weakness and fatigue Goal status: INITIAL  4.  Pt will demonstrate ability to dead lift 50 # to simulate caring for 50 lb autistic daughter Baseline: unable to lift presently without unsteadiness Goal status: INITIAL  5.  Pt will be able to improve balance in order to mow lawn on uneven ground with no loss of balance Baseline: unable to mow  lawn at present due to weakness and unsteadiness Goal status: INITIAL  6.  PSFS score with increase to at least 20/30 for MDIC.  Baseline: 18/30 Goal status: INITIAL  PLAN:  PT FREQUENCY: 1-2x/week  PT DURATION: 6 weeks  PLANNED INTERVENTIONS: 97164- PT Re-evaluation, 97750- Physical Performance Testing, 97110-Therapeutic exercises, 97530- Therapeutic activity, [id number]- Neuromuscular re-education, 97535- Self Care, 02859- Manual therapy, [id number]- Gait training, [phone]- Electrical stimulation (manual), [phone] (1-2 muscles), 20561 (3+ muscles)- Dry Needling, Patient/Family education, Balance training, Stair training, Taping, Joint mobilization, Spinal mobilization, Cryotherapy, and Moist heat.  PLAN FOR NEXT SESSION:  CKC exercises. Progress balance exercises , perhaps DGI  Graydon Dingwall, PT, ATRIC Certified Exercise Expert for the Aging Adult  09/23/23 11:22 AM Phone: [phone] Fax: [phone]   For all possible CPT codes, reference the Planned Interventions line above.     Check all conditions that are expected to impact treatment: {Conditions expected to impact treatment:Diabetes mellitus and Musculoskeletal disorders

## 2023-09-22 NOTE — Therapy (Incomplete Revision)
 OUTPATIENT PHYSICAL THERAPY THORACOLUMBAR EVALUATION   Patient Name: Jack Cunningham MRN: [id number] DOB:1960-05-28, 63 y.o., male Today's Date: 09/23/2023  END OF SESSION:  PT End of Session - 09/23/23 0934     Visit Number 1    Number of Visits 13    Date for PT Re-Evaluation 11/04/23    Authorization Type [place] [employer]  BERG/ PSFS    PT Start Time 0933    PT Stop Time 1020    PT Time Calculation (min) 47 min    Equipment Utilized During Treatment Gait belt    Activity Tolerance Patient tolerated treatment well;Patient limited by pain;Patient limited by fatigue    Behavior During Therapy [facility] for tasks assessed/performed          Past Medical History:  Diagnosis Date   Asthma    COPD (chronic obstructive pulmonary disease) (HCC)    Myocardial infarction (HCC)    1981 at age 36   Pre-diabetes    Sleep apnea    No CPAP   Past Surgical History:  Procedure Laterality Date   no surgeries as of 07/21/19     TOOTH EXTRACTION N/A 08/30/2020   Procedure: DENTAL RESTORATION/EXTRACTIONS;  Surgeon: Sheryle Hamilton, DMD;  Location: [facility] OR;  Service: Oral Surgery;  Laterality: N/A;   Patient Active Problem List   Diagnosis Date Noted   DKA (diabetic ketoacidosis) (HCC) 09/02/2023   COPD (chronic obstructive pulmonary disease) (HCC) 06/24/2017   COPD exacerbation (HCC) 06/23/2017    PCP: Kahoano, [name], MD   REFERRING PROVIDER: Dennise Lavada POUR, MD   REFERRING DIAG: R53.1 (ICD-10-CM) - Weakness   Rationale for Evaluation and Treatment: Rehabilitation  THERAPY DIAG:  Muscle weakness (generalized) - Plan: PT plan of care cert/re-cert  Unsteadiness on feet - Plan: PT plan of care cert/re-cert  Acute pain of right knee - Plan: PT plan of care cert/re-cert  Other abnormalities of gait and mobility - Plan: PT plan of care cert/re-cert  ONSET DATE: September 02, 2023  DKA  SUBJECTIVE:                                                                                                                                                                                            SUBJECTIVE STATEMENT: I am here because of my Diabetes and weakness. I have poor vision and I have left knee numbness and I have weakness in both my legs. I am feeling a bit off in my balance and I have overall weakness and I would like to get stronger. I was in the hosptal September 02, 2023 with Diabetic Ketoacidosis for 7 days.  I feel weak and not able to shop at [employer] like I used to.  I have a dtr with autism and I need to help and care for her and she is about 50lb and I cannot do it right now. I am afraid that Iwill fall. I have fallen twice when I ' blacked  out, but I think my DM medication is better now.  PERTINENT HISTORY:  MI (in high school), COPD. DM, Sleep apnea, Asthma  PAIN:  Are you having pain? Yes: NPRS scale: at rest 5/10 at worst at night 8/10 Pain location: right knee pain Pain description: aching  Aggravating factors: lying in bed at night, stand longer than 15 minutes.  Relieving factors: resting  PRECAUTIONS: Other: Novocain  RED FLAGS: None   WEIGHT BEARING RESTRICTIONS: No  FALLS:  Has patient fallen in last 6 months? Yes. 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  DIAGNOSTIC FINDINGS:  See medical chart  PATIENT SURVEYS:  PSFS: THE PATIENT SPECIFIC FUNCTIONAL SCALE  Place score of 0-10 (0 = unable to perform activity and 10  = able to perform activity at the same level as before injury or problem)  Activity Date: 09-23-23    Shopping in grocery store for 30 minutes without fatigue       7    2. Mowing the [place]        6    3.Pick up 50 lb autistic dtr       5    4.      Total Score 18/3      Total Score = Sum of activity scores/number of activities  Minimally Detectable Change: 3 points (for single activity); 2 points (for average score)  [name] (nd). The Patient Specific Functional Scale . Retrieved from [email or web address]    COGNITION: Overall cognitive status: Within functional limits for tasks assessed     SENSATION: Pt reports numbness over left thigh to knee and and also over right thigh to knee. Pt reports no numbess in bil feet  MUSCLE LENGTH: Hamstrings: bil tightness Thomas test: Right 15 deg; Left 10 deg  POSTURE: rounded shoulders, forward head, and decreased lumbar lordosis  PALPATION: TTP over medial knee  LUMBAR ROM:   AROM eval  Flexion Fingertips to lower shin  Extension WNL  Right lateral flexion WNL  Left lateral flexion WNL  Right rotation WNL  Left rotation WNL  Key: WFL = within functional limits not formally assessed, * = concordant pain, s = stiffness/stretching sensation, NT = not tested)    LOWER EXTREMITY ROM:     Active  Right eval Left eval  Hip flexion    Hip extension    Hip abduction    Hip adduction    Hip internal rotation    Hip external rotation    Knee flexion 124  130  Knee extension 0 0  Ankle dorsiflexion    Ankle plantarflexion  Ankle inversion    Ankle eversion    Key: WFL = within functional limits not formally assessed, * = concordant pain, s = stiffness/stretching sensation, NT = not tested)     LOWER EXTREMITY MMT:    MMT Right eval Left eval  Hip flexion 4+ 4+  Hip extension 4 4  Hip abduction 4 4  Hip adduction    Hip internal rotation    Hip  external rotation    Knee flexion 4 with pain 5  Knee extension 4 5  Ankle dorsiflexion    Ankle plantarflexion 14/25 18/25  Ankle inversion    Ankle eversion    (Blank rows = not tested, score listed is out of 5 possible points.  N = WNL, D = diminished, C = clear for gross weakness with myotome testing, * = concordant pain with testing)     FUNCTIONAL TESTS:  5 times sit to stand: 20.27 sec 6 minute walk test: 1088 Berg Balance Scale:  Item Test date: 09-23-23 Test date:  Test date:   Sitting to standing 4. able to stand without using hands and stabilize independently Insert OPRCBERGREEVAL SmartPhrase at re-test date Insert OPRCBERGREEVAL SmartPhrase at re-test date  2. Standing unsupported 4. able to stand safely for 2 minutes    3. Sitting with back unsupported, feet supported 4. able to sit safely and securely for 2 minutes    4. Standing to sitting 4. sits safely with minimal use of hands    5. Pivot transfer  4. able to transfer safely with minor use of hands    6. Standing unsupported with eyes closed 4. able to stand 10 seconds safely    7. Standing unsupported with feet together 3. able to place feet together independently and stand 1 minute with supervision    8. Reaching forward with outstretched arms while standing 4. can reach forward confidently 25 cm (10 inches)    9. Pick up object from the floor from standing 4. able to pick up slipper safely and easily    10. Turning to look behind over left and right shoulders while standing 4. looks behind from both sides and weight shifts well    11. Turn 360 degrees 4. able to turn 360 degrees safely in 4 seconds or less    12. Place alternate foot on step or stool while standing unsupported 4. ble to stand independently and safely and complete 8 steps in 20 seconds    13. Standing unsupported one foot in front 1. needs help to step but can hold 15 seconds    14. Standing on one leg 3. able to lift leg independently and hold 5-10  seconds     Total Score 47/56 Total Score /56 Total Score /56     GAIT: Distance walked: 1088 ft (1830-2205 ft Norm) Assistive device utilized: None Level of assistance: Complete Independence Comments: Pt with some right knee pain and slow and careful with unsteadiness on feet  TREATMENT DATE: 09-23-23  Eval and issue of HEP/ fall prevention  PATIENT EDUCATION:  Education details: POC Explanation of findings, issue HEP and fall prevention Person educated: Patient Education method: Explanation, Demonstration, Tactile cues, Verbal cues, and Handouts Education comprehension: verbalized understanding, returned demonstration, verbal cues required, tactile cues required, and needs further education  HOME EXERCISE PROGRAM: Access Code: [email or web address] URL: [email or web address] Date: 09/23/2023 Prepared by: Graydon Dingwall  Exercises - Supine Lower Trunk Rotation  - 2 x daily - 7 x weekly - 1 sets - 5 reps - 20 hold - Seated Hamstring Stretch  - 2 x daily - 7 x weekly - 1 sets - 2 reps - 20-30 sec hold - Sit to stand with sink support Movement snack  - 3 x daily - 7 x weekly - 1 sets - 10 reps - Seated Knee Extension with Resistance  - 1 x daily - 7 x weekly - 3 sets - 10 reps - Standing Heel Raise with Support  - 1 x daily - 7 x weekly - 3 sets - 10 reps  ASSESSMENT:  CLINICAL IMPRESSION: Patient is a 63 y.o. male who was seen today for physical therapy evaluation and treatment for generalized weakness following DKA episode with hospitalization.  Pt also with Right acute medial knee pain and some bil numbness bil since hospitalization. Pt with balance deficits of BERG 47/56, and functional weakness noted on PF heel raise. Pt with unsteadiness and fatigue and will benefit from skilled PT to address impairment and return to PLOF  with safety and  strength to care for autistic dtr weighing 50 #.   OBJECTIVE IMPAIRMENTS: decreased activity tolerance, decreased balance, decreased endurance, decreased knowledge of condition, difficulty walking, decreased ROM, decreased strength, impaired sensation, improper body mechanics, and pain.   ACTIVITY LIMITATIONS: carrying, lifting, standing, squatting, stairs, transfers, locomotion level, and caring for others  PARTICIPATION LIMITATIONS: cleaning, shopping, community activity, yard work, and caring for autistic dtr  PERSONAL FACTORS: MI (in high school), COPD. DM, Sleep apnea, Asthma are also affecting patient's functional outcome.   REHAB POTENTIAL: Good  CLINICAL DECISION MAKING: Evolving/moderate complexity  EVALUATION COMPLEXITY: Moderate   GOALS: Goals reviewed with patient? Yes  SHORT TERM GOALS: Target date: 10-14-23  Pt will be I with initial HEP Baseline: no knowledge Goal status: INITIAL  2.   Pt will perform 5xSTS in <16 sec in order to demonstrate reduced fall risk and improved functional independence. (MCID of 2.3sec) Baseline: 20.27 sec Goal status: INITIAL  3.  Pt will be educated on fall preparedness Baseline: no knowledge Goal status: INITIAL    LONG TERM GOALS: Target date: 11-04-23  Pt will be I with advanced HEP Baseline: no knowledge Goal status: INITIAL  2.  Pt will show improved ability to walk for exercise by improving within norms for age Baseline: 1088 ft Norm 1830- 2205 ft Goal status: INITIAL  3.  Pt will be able to shop at [facility] for 30 minutes without fatiguing Baseline: unable to shop due to weakness and fatigue Goal status: INITIAL  4.  Pt will demonstrate ability to dead lift 50 # to simulate caring for 50 lb autistic daughter Baseline: unable to lift presently without unsteadiness Goal status: INITIAL  5.  Pt will be able to improve balance in order to mow lawn on uneven ground with no loss of balance Baseline: unable to mow  lawn at present due to weakness and unsteadiness Goal status: INITIAL  6.  PSFS score with increase to at least 2 points for average score for MDIC.  revised Baseline:  18/3  (6) Goal status: INITIAL  PLAN:  PT FREQUENCY: 1-2x/week  PT DURATION: 6 weeks  PLANNED INTERVENTIONS: 97164- PT Re-evaluation, 97750- Physical Performance Testing, 97110-Therapeutic exercises, 97530- Therapeutic activity, [id number]- Neuromuscular re-education, 97535- Self Care, [id number]- Manual therapy, [id number]- Gait training, [phone]- Electrical stimulation (manual), [phone] (1-2 muscles), 20561 (3+ muscles)- Dry Needling, Patient/Family education, Balance training, Stair training, Taping, Joint mobilization, Spinal mobilization, Cryotherapy, and Moist heat.  PLAN FOR NEXT SESSION:  CKC exercises. Progress balance exercises , perhaps DGI  Graydon Dingwall, PT, [facility] Certified Exercise Expert for the Aging Adult  09/23/23 11:22 AM Phone: [phone] Fax: [phone]   For all possible CPT codes, reference the Planned Interventions line above.     Check all conditions that are expected to impact treatment: {Conditions expected to impact treatment:Diabetes mellitus and Musculoskeletal disorders   Graydon Dingwall, PT, [facility] Certified Exercise Expert for the Aging Adult  09/23/23 8:06 PM Phone: [phone] Fax: [phone]

## 2023-09-23 ENCOUNTER — Ambulatory Visit: Attending: Internal Medicine | Admitting: Physical Therapy

## 2023-09-23 DIAGNOSIS — M6281 Muscle weakness (generalized): Secondary | ICD-10-CM | POA: Insufficient documentation

## 2023-09-23 DIAGNOSIS — M25561 Pain in right knee: Secondary | ICD-10-CM | POA: Insufficient documentation

## 2023-09-23 DIAGNOSIS — R2689 Other abnormalities of gait and mobility: Secondary | ICD-10-CM | POA: Diagnosis present

## 2023-09-23 DIAGNOSIS — R2681 Unsteadiness on feet: Secondary | ICD-10-CM | POA: Diagnosis present

## 2023-09-28 NOTE — Therapy (Signed)
 OUTPATIENT PHYSICAL THERAPY THORACOLUMBAR TREATMENT   Patient Name: Jack Cunningham MRN: [id number] DOB:[birth year], 63 y.o., male Today's Date: 09/29/2023  END OF SESSION:  PT End of Session - 09/29/23 1106     Visit Number 2    Number of Visits 13    Date for PT Re-Evaluation 11/04/23    Authorization Type [employer]  BERG/ PSFS    PT Start Time 1103    PT Stop Time 1148    PT Time Calculation (min) 45 min    Activity Tolerance Patient tolerated treatment well;Patient limited by pain;Patient limited by fatigue    Behavior During Therapy [facility] for tasks assessed/performed           Past Medical History:  Diagnosis Date   Asthma    COPD (chronic obstructive pulmonary disease) (HCC)    Myocardial infarction (HCC)    1981 at age 80   Pre-diabetes    Sleep apnea    No CPAP   Past Surgical History:  Procedure Laterality Date   no surgeries as of 07/21/19     TOOTH EXTRACTION N/A 08/30/2020   Procedure: DENTAL RESTORATION/EXTRACTIONS;  Surgeon: Sheryle Hamilton, DMD;  Location: [facility] OR;  Service: Oral Surgery;  Laterality: N/A;   Patient Active Problem List   Diagnosis Date Noted   DKA (diabetic ketoacidosis) (HCC) 09/02/2023   COPD (chronic obstructive pulmonary disease) (HCC) 06/24/2017   COPD exacerbation (HCC) 06/23/2017    PCP: Kahoano, [name], MD   REFERRING PROVIDER: Dennise Lavada POUR, MD   REFERRING DIAG: R53.1 (ICD-10-CM) - Weakness   Rationale for Evaluation and Treatment: Rehabilitation  THERAPY DIAG:  Muscle weakness (generalized)  Unsteadiness on feet  ONSET DATE: September 02, 2023  DKA  SUBJECTIVE:                                                                                                                                                                                           SUBJECTIVE STATEMENT:  Feel like a 5/10 pain today.  I used to bowl and use about a 15 # bowling ball. My vision has been bothering me with the DM.  I have been  doing my exercises.   EVAL- I am here because of my Diabetes and weakness. I have poor vision and I have left knee numbness and I have weakness in both my legs. I am feeling a bit off in my balance and I have overall weakness and I would like to get stronger. I was in the hosptal September 02, 2023 with Diabetic Ketoacidosis for 7 days.  I feel weak and not able to shop at [facility]  [employer] like I used to.  I have a dtr with autism and I need to help and care for her and she is about 50lb and I cannot do it right now. I am afraid that Iwill fall. I have fallen twice when I ' blacked  out, but I think my DM medication is better now.  PERTINENT HISTORY:  MI (in high school), COPD. DM, Sleep apnea, Asthma  PAIN:  Are you having pain? Yes: NPRS scale: at rest 5/10 at worst at night 8/10 Pain location: right knee pain Pain description: aching  Aggravating factors: lying in bed at night, stand longer than 15 minutes.  Relieving factors: resting  PRECAUTIONS: Other: Novocain  RED FLAGS: None   WEIGHT BEARING RESTRICTIONS: No  FALLS:  Has patient fallen in last 6 months? Yes. 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  DIAGNOSTIC FINDINGS:  See medical chart  PATIENT SURVEYS:  PSFS: THE PATIENT SPECIFIC FUNCTIONAL SCALE  Place score of 0-10 (0 = unable to perform activity and 10 = able to perform activity at the same level as before injury or problem)  Activity Date:  09-23-23    Shopping in grocery store for 30 minutes without fatigue       7    2. Mowing the [place]        6    3.Pick up 50 lb autistic dtr       5    4.      Total Score 18/3      Total Score = Sum of activity scores/number of activities  Minimally Detectable Change: 3 points (for single activity); 2 points (for average score)  [name] (nd). The Patient Specific Functional Scale . Retrieved from [email or web address]    COGNITION: Overall cognitive status: Within functional limits for tasks assessed     SENSATION: Pt reports numbness over left thigh to knee and and also over right thigh to knee. Pt reports no numbess in bil feet  MUSCLE LENGTH: Hamstrings: bil tightness Thomas test: Right 15 deg; Left 10 deg  POSTURE: rounded shoulders, forward head, and decreased lumbar lordosis  PALPATION: TTP over medial knee  LUMBAR ROM:   AROM eval  Flexion Fingertips to lower shin  Extension WNL  Right lateral flexion WNL  Left lateral flexion WNL  Right rotation WNL  Left rotation WNL  Key: WFL = within functional limits not formally assessed, * = concordant pain, s = stiffness/stretching sensation, NT = not tested)    LOWER EXTREMITY ROM:     Active  Right eval Left eval  Hip flexion    Hip extension    Hip abduction    Hip adduction    Hip internal rotation    Hip external rotation    Knee flexion 124  130  Knee extension 0 0  Ankle dorsiflexion    Ankle plantarflexion    Ankle inversion    Ankle eversion  Key: WFL = within functional limits not formally assessed, * = concordant pain, s = stiffness/stretching sensation, NT = not tested)     LOWER EXTREMITY MMT:    MMT Right eval Left eval  Hip flexion 4+ 4+  Hip extension 4 4  Hip abduction 4 4  Hip adduction    Hip internal rotation    Hip external rotation    Knee flexion 4 with pain 5  Knee extension 4 5  Ankle dorsiflexion     Ankle plantarflexion 14/25 18/25  Ankle inversion    Ankle eversion    (Blank rows = not tested, score listed is out of 5 possible points.  N = WNL, D = diminished, C = clear for gross weakness with myotome testing, * = concordant pain with testing)     FUNCTIONAL TESTS:  5 times sit to stand: 20.27 sec 6 minute walk test: 1013ft  Berg Balance Scale:  Item Test date: 09-23-23 Test date:  Test date:   Sitting to standing 4. able to stand without using hands and stabilize independently Insert OPRCBERGREEVAL SmartPhrase at re-test date Insert OPRCBERGREEVAL SmartPhrase at re-test date  2. Standing unsupported 4. able to stand safely for 2 minutes    3. Sitting with back unsupported, feet supported 4. able to sit safely and securely for 2 minutes    4. Standing to sitting 4. sits safely with minimal use of hands    5. Pivot transfer  4. able to transfer safely with minor use of hands    6. Standing unsupported with eyes closed 4. able to stand 10 seconds safely    7. Standing unsupported with feet together 3. able to place feet together independently and stand 1 minute with supervision    8. Reaching forward with outstretched arms while standing 4. can reach forward confidently 25 cm (10 inches)    9. Pick up object from the floor from standing 4. able to pick up slipper safely and easily    10. Turning to look behind over left and right shoulders while standing 4. looks behind from both sides and weight shifts well    11. Turn 360 degrees 4. able to turn 360 degrees safely in 4 seconds or less    12. Place alternate foot on step or stool while standing unsupported 4. ble to stand independently and safely and complete 8 steps in 20 seconds    13. Standing unsupported one foot in front 1. needs help to step but can hold 15 seconds    14. Standing on one leg 3. able to lift leg independently and hold 5-10 seconds     Total Score 47/56 Total Score /56 Total Score /56     GAIT: Distance  walked: 1088 ft (1830-2205 ft Norm) Assistive device utilized: None Level of assistance: Complete Independence Comments: Pt with some right knee pain and slow and careful with unsteadiness on feet  TREATMENT DATE:  [facility] Adult PT Treatment:                                                DATE: 09-29-23 Therapeutic Exercise: LTR Seated Hamstring Stretch  Seated Knee Extension with Resistance   Standing Heel Raise with Support   Therapeutic Activity: Nu step with UE/LE  6 min level 5 while taking subjective STS 3 x 10  15# KB  Standing hip abduction standing hip extension Standing hip extension with RTB Standing hip abduction with RTB Gait training on level surfaces attention to stride length and cadence and arm swing Simulation of bowling ball with 15 3 KB  Self Care: Standing and sitting postures Body mechanics and posture and household chore mechanics    09-23-23  Eval and issue of HEP/ fall prevention                                                                                                                                PATIENT EDUCATION:  Education details: POC Explanation of findings, issue HEP and fall prevention Person educated: Patient Education method: Explanation, Demonstration, Tactile cues, Verbal cues, and Handouts Education comprehension: verbalized understanding, returned demonstration, verbal cues required, tactile cues required, and needs further education  HOME EXERCISE PROGRAM: Access Code: [email or web address] URL: [email or web address] Date: 09/23/2023 Prepared by: Graydon Dingwall  Exercises - Supine Lower Trunk Rotation  - 2 x daily - 7 x weekly - 1 sets - 5 reps - 20 hold - Seated Hamstring Stretch  - 2 x daily - 7 x weekly - 1 sets - 2 reps - 20-30 sec hold - Sit to stand with sink support Movement snack  - 3 x daily - 7 x weekly - 1 sets - 10 reps - Seated Knee Extension with Resistance  - 1 x daily - 7 x weekly - 3 sets - 10 reps -  Standing Heel Raise with Support  - 1 x daily - 7 x weekly - 3 sets - 10 reps Added 09-29-23 - Supine Bridge  - 1 x daily - 7 x weekly - 3 sets - 10 reps - Hip Extension with Resistance Loop  - 1 x daily - 7 x weekly - 3 sets - 10 reps - Standing Hip Abduction with Resistance at Ankles and Counter Support  - 1 x daily - 7 x weekly - 3 sets - 10 reps ASSESSMENT:  CLINICAL IMPRESSION: Mr Brion returns to clinic with 5/10 global pain and ends with no pain at end of session with exercise only. Educated on importance of walking for health.  Session concentrates on updating HEP and education on body mechanics with handout and explanation and return demo as needed to reinforce principles.  Pt also simulated body mechanics in order to handle 15 # Bowling ball with good balance and stability. Pt left with all questions answered from PT perspective.  Will continue toward completion of STG    EVAL- Patient is a 63 y.o. male who was seen today for physical therapy evaluation and treatment for generalized weakness following DKA episode with hospitalization.  Pt also with Right acute medial knee pain and some bil numbness bil since hospitalization. Pt with balance deficits of BERG 47/56, and functional weakness noted on PF heel raise. Pt with unsteadiness and fatigue and will benefit from skilled PT to address impairment and return to PLOF  with safety and strength to care for autistic dtr weighing 50 #.   OBJECTIVE IMPAIRMENTS: decreased activity tolerance, decreased balance, decreased endurance, decreased knowledge of condition, difficulty walking, decreased ROM, decreased strength, impaired sensation, improper body mechanics, and pain.   ACTIVITY LIMITATIONS: carrying, lifting, standing, squatting, stairs, transfers, locomotion level, and caring for others  PARTICIPATION LIMITATIONS: cleaning, shopping, community activity, yard work, and caring for autistic dtr  PERSONAL FACTORS: MI (in high school), COPD.  DM, Sleep apnea, Asthma are also affecting patient's functional outcome.   REHAB POTENTIAL: Good  CLINICAL DECISION MAKING: Evolving/moderate complexity  EVALUATION COMPLEXITY: Moderate   GOALS: Goals reviewed with patient? Yes  SHORT TERM GOALS: Target date: 10-14-23  Pt will be I with initial HEP Baseline: no knowledge Goal status: INITIAL  2.   Pt will perform 5xSTS in <16 sec in order to demonstrate reduced fall risk and improved functional independence. (MCID of 2.3sec) Baseline: 20.27 sec Goal status: INITIAL  3.  Pt will be educated on fall preparedness Baseline: no knowledge Goal status: INITIAL    LONG TERM GOALS: Target date: 11-04-23  Pt will be I with advanced HEP Baseline: no knowledge Goal status: INITIAL  2.  Pt will show improved ability to walk for exercise by improving within norms for age Baseline: 1088 ft Norm 1830- 2205 ft Goal status: INITIAL  3.  Pt will be able to shop at [facility] for 30 minutes without fatiguing Baseline: unable to shop due to weakness and fatigue Goal status: INITIAL  4.  Pt will demonstrate ability to dead lift 50 # to simulate caring for 50 lb autistic daughter Baseline: unable to lift presently without unsteadiness Goal status: INITIAL  5.  Pt will be able to improve balance in order to mow lawn on uneven ground with no loss of balance Baseline: unable to mow lawn at present due to weakness and unsteadiness Goal status: INITIAL  6.  PSFS score with increase to at least 2 points for average score for MDIC.  revised Baseline: 18/3  (6) Goal status: INITIAL  PLAN:  PT FREQUENCY: 1-2x/week  PT DURATION: 6 weeks  PLANNED INTERVENTIONS: 97164- PT Re-evaluation, 97750- Physical Performance Testing, 97110-Therapeutic exercises, 97530- Therapeutic activity, [id number]- Neuromuscular re-education, 97535- Self Care, 02859- Manual therapy, [id number]- Gait training, [id number]- Electrical stimulation (manual), [phone] (1-2 muscles), 20561  (3+ muscles)- Dry Needling, Patient/Family education, Balance training, Stair training, Taping, Joint mobilization, Spinal mobilization, Cryotherapy, and Moist heat.  PLAN FOR NEXT SESSION:  CKC exercises. Progress balance exercises , perhaps DGI  Graydon Dingwall, PT, ATRIC Certified Exercise Expert for the Aging Adult  09/29/23 12:17 PM Phone: [phone] Fax: [phone]   For all possible CPT codes, reference the Planned Interventions line above.     Check all conditions that are expected to impact treatment: {Conditions expected to impact treatment:Diabetes mellitus and Musculoskeletal disorders

## 2023-09-29 ENCOUNTER — Ambulatory Visit: Admitting: Physical Therapy

## 2023-09-29 ENCOUNTER — Encounter: Payer: Self-pay | Admitting: Physical Therapy

## 2023-09-29 DIAGNOSIS — M6281 Muscle weakness (generalized): Secondary | ICD-10-CM

## 2023-09-29 DIAGNOSIS — R2681 Unsteadiness on feet: Secondary | ICD-10-CM

## 2023-09-29 NOTE — Patient Instructions (Signed)
 Posture Tips DO: - stand tall and erect - keep chin tucked in - keep head and shoulders in alignment - check posture regularly in mirror or large window - pull head back against headrest in car seat;  Change your position often.  Sit with lumbar support. DON'T: - slouch or slump while watching TV or reading - sit, stand or lie in one position  for too long;  Sitting is especially hard on the spine so if you sit at a desk/use the computer, then stand up often!   Copyright  VHI. All rights reserved.  Posture - Standing   Good posture is important. Avoid slouching and forward head thrust. Maintain curve in low back and align ears over shoul- ders, hips over ankles.  Pull your belly button in toward your back bone.   Copyright  VHI. All rights reserved.  Posture - Sitting   Sit upright, head facing forward. Try using a roll to support lower back. Keep shoulders relaxed, and avoid rounded back. Keep hips level with knees. Avoid crossing legs for long periods.   Copyright  VHI. All rights reserved.   Sleeping on Back  Place pillow under knees. A pillow with cervical support and a roll around waist are also helpful. Copyright  VHI. All rights reserved.  Sleeping on Side Place pillow between knees. Use cervical support under neck and a roll around waist as needed. Copyright  VHI. All rights reserved.   Sleeping on Stomach   If this is the only desirable sleeping position, place pillow under lower legs, and under stomach or chest as needed.  Posture - Sitting   Sit upright, head facing forward. Try using a roll to support lower back. Keep shoulders relaxed, and avoid rounded back. Keep hips level with knees. Avoid crossing legs for long periods. Stand to Sit / Sit to Stand   To sit: Bend knees to lower self onto front edge of chair, then scoot back on seat. To stand: Reverse sequence by placing one foot forward, and scoot to front of seat. Use rocking motion to stand up.   Work  Height and Reach  Ideal work height is no more than 2 to 4 inches below elbow level when standing, and at elbow level when sitting. Reaching should be limited to arm's length, with elbows slightly bent.  Bending  Bend at hips and knees, not back. Keep feet shoulder-width apart.    Posture - Standing   Good posture is important. Avoid slouching and forward head thrust. Maintain curve in low back and align ears over shoul- ders, hips over ankles.  Alternating Positions   Alternate tasks and change positions frequently to reduce fatigue and muscle tension. Take rest breaks. Computer Work   Position work to Art gallery manager. Use proper work and seat height. Keep shoulders back and down, wrists straight, and elbows at right angles. Use chair that provides full back support. Add footrest and lumbar roll as needed.  Getting Into / Out of Car  Lower self onto seat, scoot back, then bring in one leg at a time. Reverse sequence to get out.  Dressing  Lie on back to pull socks or slacks over feet, or sit and bend leg while keeping back straight.    Housework - Sink  Place one foot on ledge of cabinet under sink when standing at sink for prolonged periods.   Pushing / Pulling  Pushing is preferable to pulling. Keep back in proper alignment, and use leg muscles to do  the work.  Deep Squat   Squat and lift with both arms held against upper trunk. Tighten stomach muscles without holding breath. Use smooth movements to avoid jerking.  Avoid Twisting   Avoid twisting or bending back. Pivot around using foot movements, and bend at knees if needed when reaching for articles.  Carrying Luggage   Distribute weight evenly on both sides. Use a cart whenever possible. Do not twist trunk. Move body as a unit.   Lifting Principles Maintain proper posture and head alignment. Slide object as close as possible before lifting. Move obstacles out of the way. Test before lifting; ask for  help if too heavy. Tighten stomach muscles without holding breath. Use smooth movements; do not jerk. Use legs to do the work, and pivot with feet. Distribute the work load symmetrically and close to the center of trunk. Push instead of pull whenever possible.   Ask For Help   Ask for help and delegate to others when possible. Coordinate your movements when lifting together, and maintain the low back curve.  Log Roll   Lying on back, bend left knee and place left arm across chest. Roll all in one movement to the right. Reverse to roll to the left. Always move as one unit. Housework - Sweeping  Use long-handled equipment to avoid stooping.   Housework - Wiping  Position yourself as close as possible to reach work surface. Avoid straining your back.  Laundry - Unloading Wash   To unload small items at bottom of washer, lift leg opposite to arm being used to reach.  Gardening - Raking  Move close to area to be raked. Use arm movements to do the work. Keep back straight and avoid twisting.     Cart  When reaching into cart with one arm, lift opposite leg to keep back straight.   Getting Into / Out of Bed  Lower self to lie down on one side by raising legs and lowering head at the same time. Use arms to assist moving without twisting. Bend both knees to roll onto back if desired. To sit up, start from lying on side, and use same move-ments in reverse. Housework - Vacuuming  Hold the vacuum with arm held at side. Step back and forth to move it, keeping head up. Avoid twisting.   Laundry - Armed forces training and education officer so that bending and twisting can be avoided.   Laundry - Unloading Dryer  Squat down to reach into clothes dryer or use a reacher.  Gardening - Weeding / Psychiatric nurse or Kneel. Knee pads may be helpful.                    Graydon Dingwall, PT, ATRIC Certified Exercise Expert for the Aging Adult  09/29/23 11:06 AM Phone:  [phone] Fax: [phone]

## 2023-10-05 ENCOUNTER — Encounter: Attending: Internal Medicine | Admitting: Dietician

## 2023-10-05 ENCOUNTER — Encounter: Payer: Self-pay | Admitting: Dietician

## 2023-10-05 DIAGNOSIS — E118 Type 2 diabetes mellitus with unspecified complications: Secondary | ICD-10-CM | POA: Diagnosis present

## 2023-10-05 NOTE — Progress Notes (Unsigned)
 203 lbs 1415-  Patient is here today with his wife and daughter.   Current vision issues due to diabetes.  Referral:  Type 2 Diabetes   History includes:  Type 2 Diabetes (2025), MI, HTN, asthma, OSA (no c-pap at home), recent admit with DKA, pancreatitis and AKI with sepsis secondary to PNA Medications include:  Lantus  20 units q am, Novolog  based on sliding scale (generally 2 units per wife) before each meal, vitamin D before (hasn't started yet), pravastatin , Metformin Labs noted to include:  A1C 13.5% 09/02/2023, eGFR >60 09/16/2023 Lipid Panel     Component Value Date/Time   TRIG 146 09/03/2023 0704   Dexcom G7  CGM Results from download: 10/05/2023  % Time CGM active:   *** %   (Goal >70%)  Average glucose:    121mg /dL for 3 days  Glucose management indicator:   N/a %  Time in range (70-180 mg/dL):   14 %   (Goal >29%)  Time High (181-250 mg/dL):   4 %   (Goal < 74%)  Time Very High (>250 mg/dL):    0 %   (Goal < 5%)  Time Low (54-69 mg/dL):   0 %   (Goal <5%)  Time Very Low (<54 mg/dL):   0 %   (Goal <8%)  %CV (glucose variability)    *** %  (Goal <36%)    70 203 lbs 10/05/2023 increased from 195 lbs when admitted to the hospital 225 lbs fall 2024  Patient lives with his wife and daughter.  His wife and daughter do the shopping and cooking.  He works in [employer] center but is currently out of work due to this diagnosis. Does PT exercises and weights.

## 2023-10-05 NOTE — Patient Instructions (Signed)
 Things to talk to your doctor about tomorrow:  Prescription for Dexcom G7  Other medication prescriptions  Take your medication as prescribed:  Lantus   Novolog   Metformin  Vitamin D  Other  Continue to be active. Aim for 30 minutes most days.   Aim for 4 Carb Choices per meal (60 grams) +/- 1 either way  Aim for 0-1 Carbs per snack if hungry  Include protein in moderation with your meals and snacks Consider reading food labels for Total Carbohydrate of foods Consistently use your CGM Continue taking medication as directed by MD

## 2023-10-06 NOTE — Therapy (Signed)
 OUTPATIENT PHYSICAL THERAPY THORACOLUMBAR TREATMENT   Patient Name: Jack Cunningham MRN: [id number] DOB:May 30, 1960, 63 y.o., male Today's Date: 10/07/2023  END OF SESSION:  PT End of Session - 10/07/23 0838     Visit Number 3    Number of Visits 13    Authorization Type [place] [employer]  BERG/ PSFS    PT Start Time 0845    PT Stop Time 0930    PT Time Calculation (min) 45 min    Activity Tolerance Patient tolerated treatment well;Patient limited by pain;Patient limited by fatigue    Behavior During Therapy [facility] for tasks assessed/performed            Past Medical History:  Diagnosis Date   Asthma    COPD (chronic obstructive pulmonary disease) (HCC)    Diabetes mellitus without complication (HCC)    Myocardial infarction (HCC)    1981 at age 38   Pre-diabetes    Sleep apnea    No CPAP   Past Surgical History:  Procedure Laterality Date   no surgeries as of 07/21/19     TOOTH EXTRACTION N/A 08/30/2020   Procedure: DENTAL RESTORATION/EXTRACTIONS;  Surgeon: Jack Cunningham;  Location: [facility] OR;  Service: Oral Surgery;  Laterality: N/A;   Patient Active Problem List   Diagnosis Date Noted   DKA (diabetic ketoacidosis) (HCC) 09/02/2023   COPD (chronic obstructive pulmonary disease) (HCC) 06/24/2017   COPD exacerbation (HCC) 06/23/2017    PCP: K[place], Jack Cunningham   REFERRING PROVIDER: Dennise Lavada POUR, Cunningham   REFERRING DIAG: R53.1 (ICD-10-CM) - Weakness   Rationale for Evaluation and Treatment: Rehabilitation  THERAPY DIAG:  Muscle weakness (generalized)  Unsteadiness on feet  Acute pain of right knee  Other abnormalities of gait and mobility  ONSET DATE: September 02, 2023  DKA  SUBJECTIVE:                                                                                                                                                                                           SUBJECTIVE STATEMENT:  Feel like a 4/10 pain today. My right medial knee hurts.  I have been doing my exercises. I am trying to walk more now.   EVAL- I am here because of my Diabetes and weakness. I have poor vision and I have left knee numbness and I have weakness in both my legs. I am feeling a bit off in my balance and I have overall weakness and I would like to get stronger. I was in the hosptal September 02, 2023 with Diabetic Ketoacidosis for 7 days.  I feel weak and not able to  shop at [employer] like I used to.  I have a dtr with autism and I need to help and care for her and she is about 50lb and I cannot do it right now. I am afraid that Iwill fall. I have fallen twice when I ' blacked  out, but I think my DM medication is better now.  PERTINENT HISTORY:  MI (in high school), COPD. DM, Sleep apnea, Asthma  PAIN:  Are you having pain? Yes: NPRS scale: at rest 5/10 at worst at night 8/10 Pain location: right knee pain Pain description: aching  Aggravating factors: lying in bed at night, stand longer than 15 minutes.  Relieving factors: resting  PRECAUTIONS: Other: Novocain  RED FLAGS: None   WEIGHT BEARING RESTRICTIONS: No  FALLS:  Has patient fallen in last 6 months? Yes. 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  DIAGNOSTIC FINDINGS:  See medical chart  PATIENT SURVEYS:  PSFS: THE PATIENT SPECIFIC FUNCTIONAL SCALE  Place score of 0-10 (0 = unable to perform activity and 10 = able to perform activity at the same level as  before injury or problem)  Activity Date: 09-23-23    Shopping in grocery store for 30 minutes without fatigue       7    2. Mowing the [place]        6    3.Pick up 50 lb autistic dtr       5    4.      Total Score 18/3      Total Score = Sum of activity scores/number of activities  Minimally Detectable Change: 3 points (for single activity); 2 points (for average score)  [facility] (nd). The Patient Specific Functional Scale . Retrieved from [email or web address]    COGNITION: Overall cognitive status: Within functional limits for tasks assessed     SENSATION: Pt reports numbness over left thigh to knee and and also over right thigh to knee. Pt reports no numbess in bil feet  MUSCLE LENGTH: Hamstrings: bil tightness Thomas test: Right 15 deg; Left 10 deg  POSTURE: rounded shoulders, forward head, and decreased lumbar lordosis  PALPATION: TTP over medial knee  LUMBAR ROM:   AROM eval  Flexion Fingertips to lower shin  Extension WNL  Right lateral flexion WNL  Left lateral flexion WNL  Right rotation WNL  Left rotation WNL  Key: WFL = within functional limits not formally assessed, * = concordant pain, s = stiffness/stretching sensation, NT = not tested)    LOWER EXTREMITY ROM:     Active  Right eval Left eval  Hip flexion    Hip extension    Hip abduction    Hip adduction    Hip internal rotation    Hip external rotation    Knee flexion 124  130  Knee extension 0 0  Ankle dorsiflexion    Ankle plantarflexion    Ankle inversion    Ankle eversion  Key: WFL = within functional limits not formally assessed, * = concordant pain, s = stiffness/stretching sensation, NT = not tested)     LOWER EXTREMITY MMT:    MMT Right eval Left eval  Hip flexion 4+ 4+  Hip extension 4 4  Hip abduction 4 4  Hip adduction    Hip internal rotation    Hip external rotation    Knee flexion 4 with pain 5   Knee extension 4 5  Ankle dorsiflexion    Ankle plantarflexion 14/25 18/25  Ankle inversion    Ankle eversion    (Blank rows = not tested, score listed is out of 5 possible points.  N = WNL, D = diminished, C = clear for gross weakness with myotome testing, * = concordant pain with testing)     FUNCTIONAL TESTS:  5 times sit to stand: 20.27 sec 6 minute walk test: 1024ft  Berg Balance Scale:  Item Test date: 09-23-23 Test date:  Test date:   Sitting to standing 4. able to stand without using hands and stabilize independently Insert OPRCBERGREEVAL SmartPhrase at re-test date Insert OPRCBERGREEVAL SmartPhrase at re-test date  2. Standing unsupported 4. able to stand safely for 2 minutes    3. Sitting with back unsupported, feet supported 4. able to sit safely and securely for 2 minutes    4. Standing to sitting 4. sits safely with minimal use of hands    5. Pivot transfer  4. able to transfer safely with minor use of hands    6. Standing unsupported with eyes closed 4. able to stand 10 seconds safely    7. Standing unsupported with feet together 3. able to place feet together independently and stand 1 minute with supervision    8. Reaching forward with outstretched arms while standing 4. can reach forward confidently 25 cm (10 inches)    9. Pick up object from the floor from standing 4. able to pick up slipper safely and easily    10. Turning to look behind over left and right shoulders while standing 4. looks behind from both sides and weight shifts well    11. Turn 360 degrees 4. able to turn 360 degrees safely in 4 seconds or less    12. Place alternate foot on step or stool while standing unsupported 4. ble to stand independently and safely and complete 8 steps in 20 seconds    13. Standing unsupported one foot in front 1. needs help to step but can hold 15 seconds    14. Standing on one leg 3. able to lift leg independently and hold 5-10 seconds     Total Score 47/56 Total Score /56  Total Score /56     GAIT: Distance walked: 1088 ft (1830-2205 ft Norm) Assistive device utilized: None Level of assistance: Complete Independence Comments: Pt with some right knee pain and slow and careful with unsteadiness on feet  TREATMENT DATE:  [facility] Adult PT Treatment:                                                DATE: 10-07-23 Therapeutic Exercise: SLR 3 x 10  with 4 # cuff weight Right only SLR resisted GTB  1 x 10 bil S/L left and right hip abduction 2 x 10 with VC and TC Seated Hamstring Stretch  Seated Knee Extension  with GTB  LTR SKTC Standing Heel Raise with Support Manual Therapy: Mc Connel tape medial to lateral pull with pain reduction with exercise at beginning of session. Right knee STW medial quad right  Therapeutic Activity: STS 3 x 10  25# KB Standing hip extension with GTB 3 x 10 3  hold Right and L    [facility] Adult PT Treatment:                                                DATE: 09-29-23 Therapeutic Exercise: LTR Seated Hamstring Stretch  Seated Knee Extension with Resistance   Standing Heel Raise with Support   Therapeutic Activity: Nu step with UE/LE  6 min level 5 while taking subjective STS 3 x 10  15# KB Standing hip abduction standing hip extension Standing hip extension with RTB Standing hip abduction with RTB Gait training on level surfaces attention to stride length and cadence and arm swing Simulation of bowling ball with 15 3 KB  Self Care: Standing and sitting postures Body mechanics and posture and household chore mechanics    09-23-23  Eval and issue of HEP/ fall prevention                                                                                                                                PATIENT EDUCATION:  Education details: POC Explanation of findings, issue HEP and fall prevention Person educated: Patient Education method: Explanation, Demonstration, Tactile cues, Verbal cues, and Handouts Education  comprehension: verbalized understanding, returned demonstration, verbal cues required, tactile cues required, and needs further education  HOME EXERCISE PROGRAM: Access Code: [email or web address] URL: [email or web address] Date: 09/23/2023 Prepared by: Graydon Dingwall  Exercises - Supine Lower Trunk Rotation  - 2 x daily - 7 x weekly - 1 sets - 5 reps - 20 hold - Seated Hamstring Stretch  - 2 x daily - 7 x weekly - 1 sets - 2 reps - 20-30 sec hold - Sit to stand with sink support Movement snack  - 3 x daily - 7 x weekly - 1 sets - 10 reps - Seated Knee Extension with Resistance  - 1 x daily - 7 x weekly - 3 sets - 10 reps - Standing Heel Raise with Support  - 1 x daily - 7 x weekly - 3 sets - 10 reps Added 09-29-23 - Supine Bridge  - 1 x daily - 7 x weekly - 3 sets - 10 reps - Hip Extension with Resistance Loop  - 1 x daily - 7 x weekly - 3 sets - 10 reps - Standing Hip Abduction with Resistance at Ankles and Counter Support  - 1 x daily - 7 x weekly - 3 sets - 10 reps Added 10-07-23 - Supine Straight-Leg Raise With Resistance at Ankles  -  1 x daily - 7 x weekly - 3 sets - 10 reps - Sidelying Hip Abduction  - 1 x daily - 7 x weekly - 2 sets - 10 reps ASSESSMENT:  CLINICAL IMPRESSION: Mr Brion returns to clinic with 4/10 global pain and ends with no pain at end of session with exercise and Mc Connell taping of Right knee medial to lateral. Emphasized the importance of walking for health.  Session concentrates on updating HEP and progressing challenge. Pt left with all questions answered from PT perspective.  Will continue toward completion of STG    EVAL- Patient is a 63 y.o. male who was seen today for physical therapy evaluation and treatment for generalized weakness following DKA episode with hospitalization.  Pt also with Right acute medial knee pain and some bil numbness bil since hospitalization. Pt with balance deficits of BERG 47/56, and functional weakness noted on PF heel raise.  Pt with unsteadiness and fatigue and will benefit from skilled PT to address impairment and return to PLOF  with safety and strength to care for autistic dtr weighing 50 #.   OBJECTIVE IMPAIRMENTS: decreased activity tolerance, decreased balance, decreased endurance, decreased knowledge of condition, difficulty walking, decreased ROM, decreased strength, impaired sensation, improper body mechanics, and pain.   ACTIVITY LIMITATIONS: carrying, lifting, standing, squatting, stairs, transfers, locomotion level, and caring for others  PARTICIPATION LIMITATIONS: cleaning, shopping, community activity, yard work, and caring for autistic dtr  PERSONAL FACTORS: MI (in high school), COPD. DM, Sleep apnea, Asthma are also affecting patient's functional outcome.   REHAB POTENTIAL: Good  CLINICAL DECISION MAKING: Evolving/moderate complexity  EVALUATION COMPLEXITY: Moderate   GOALS: Goals reviewed with patient? Yes  SHORT TERM GOALS: Target date: 10-14-23  Pt will be I with initial HEP Baseline: no knowledge Goal status: ONGOING  2.   Pt will perform 5xSTS in <16 sec in order to demonstrate reduced fall risk and improved functional independence. (MCID of 2.3sec) Baseline: 20.27 sec Goal status: ONGOING  3.  Pt will be educated on fall preparedness Baseline: no knowledge Goal status: INITIAL    LONG TERM GOALS: Target date: 11-04-23  Pt will be I with advanced HEP Baseline: no knowledge Goal status: INITIAL  2.  Pt will show improved ability to walk for exercise by improving within norms for age Baseline: 1088 ft Norm 1830- 2205 ft Goal status: INITIAL  3.  Pt will be able to shop at [facility] for 30 minutes without fatiguing Baseline: unable to shop due to weakness and fatigue Goal status: INITIAL  4.  Pt will demonstrate ability to dead lift 50 # to simulate caring for 50 lb autistic daughter Baseline: unable to lift presently without unsteadiness Goal status: INITIAL  5.   Pt will be able to improve balance in order to mow lawn on uneven ground with no loss of balance Baseline: unable to mow lawn at present due to weakness and unsteadiness Goal status: INITIAL  6.  PSFS score with increase to at least 2 points for average score for [facility]IC.  revised Baseline: 18/3  (6) Goal status: INITIAL  PLAN:  PT FREQUENCY: 1-2x/week  PT DURATION: 6 weeks  PLANNED INTERVENTIONS: 97164- PT Re-evaluation, 97750- Physical Performance Testing, 97110-Therapeutic exercises, 97530- Therapeutic activity, [id number]- Neuromuscular re-education, 97535- Self Care, [id number]- Manual therapy, [id number]- Gait training, [phone]- Electrical stimulation (manual), [phone] (1-2 muscles), 20561 (3+ muscles)- Dry Needling, Patient/Family education, Balance training, Stair training, Taping, Joint mobilization, Spinal mobilization, Cryotherapy, and Moist heat.  PLAN FOR NEXT  SESSION:  CKC exercises. Progress balance exercises , perhaps DGI  Graydon Dingwall, PT, [facility] Certified Exercise Expert for the Aging Adult  10/07/23 9:34 AM Phone: [phone] Fax: [phone]   For all possible CPT codes, reference the Planned Interventions line above.     Check all conditions that are expected to impact treatment: {Conditions expected to impact treatment:Diabetes mellitus and Musculoskeletal disorders

## 2023-10-07 ENCOUNTER — Encounter: Payer: Self-pay | Admitting: Physical Therapy

## 2023-10-07 ENCOUNTER — Ambulatory Visit: Attending: Internal Medicine | Admitting: Physical Therapy

## 2023-10-07 DIAGNOSIS — R2689 Other abnormalities of gait and mobility: Secondary | ICD-10-CM | POA: Diagnosis present

## 2023-10-07 DIAGNOSIS — M6281 Muscle weakness (generalized): Secondary | ICD-10-CM | POA: Diagnosis present

## 2023-10-07 DIAGNOSIS — R2681 Unsteadiness on feet: Secondary | ICD-10-CM | POA: Diagnosis present

## 2023-10-07 DIAGNOSIS — M25561 Pain in right knee: Secondary | ICD-10-CM | POA: Diagnosis present

## 2023-10-13 ENCOUNTER — Ambulatory Visit: Admitting: Physical Therapy

## 2023-10-13 ENCOUNTER — Encounter: Payer: Self-pay | Admitting: Physical Therapy

## 2023-10-13 DIAGNOSIS — M6281 Muscle weakness (generalized): Secondary | ICD-10-CM

## 2023-10-13 DIAGNOSIS — M25561 Pain in right knee: Secondary | ICD-10-CM

## 2023-10-13 DIAGNOSIS — R2681 Unsteadiness on feet: Secondary | ICD-10-CM

## 2023-10-13 NOTE — Therapy (Signed)
 OUTPATIENT PHYSICAL THERAPY THORACOLUMBAR TREATMENT   Patient Name: Jack Cunningham MRN: [id number] DOB:06-Nov-1960, 63 y.o., male Today's Date: 10/13/2023  END OF SESSION:  PT End of Session - 10/13/23 0933     Visit Number 4    Number of Visits 13    Date for PT Re-Evaluation 11/04/23    Authorization Type [place] [facility] [employer]  BERG/ PSFS    PT Start Time 0930    PT Stop Time 1015    PT Time Calculation (min) 45 min            Past Medical History:  Diagnosis Date   Asthma    COPD (chronic obstructive pulmonary disease) (HCC)    Diabetes mellitus without complication (HCC)    Myocardial infarction (HCC)    1981 at age 83   Pre-diabetes    Sleep apnea    No CPAP   Past Surgical History:  Procedure Laterality Date   no surgeries as of 07/21/19     TOOTH EXTRACTION N/A 08/30/2020   Procedure: DENTAL RESTORATION/EXTRACTIONS;  Surgeon: Sheryle Hamilton, DMD;  Location: [facility] OR;  Service: Oral Surgery;  Laterality: N/A;   Patient Active Problem List   Diagnosis Date Noted   DKA (diabetic ketoacidosis) (HCC) 09/02/2023   COPD (chronic obstructive pulmonary disease) (HCC) 06/24/2017   COPD exacerbation (HCC) 06/23/2017    PCP: Kahoano, [name], MD   REFERRING PROVIDER: Dennise Lavada POUR, MD   REFERRING DIAG: R53.1 (ICD-10-CM) - Weakness   Rationale for Evaluation and Treatment: Rehabilitation  THERAPY DIAG:  Muscle weakness (generalized)  Unsteadiness on feet  Acute pain of right knee  ONSET DATE: September 02, 2023  DKA  SUBJECTIVE:                                                                                                                                                                                           SUBJECTIVE STATEMENT:  Feel like a 5/10 pain today. My right medial knee hurts. I have been doing my exercises. The tape helps.   EVAL- I am here because of my Diabetes and weakness. I have poor vision and I have left knee numbness and I have  weakness in both my legs. I am feeling a bit off in my balance and I have overall weakness and I would like to get stronger. I was in the hosptal September 02, 2023 with Diabetic Ketoacidosis for 7 days.  I feel weak and not able to shop at [employer] like I used to.  I have a dtr with autism and I need to help and care for her and she is about  50lb and I cannot do it right now. I am afraid that Iwill fall. I have fallen twice when I ' blacked  out, but I think my DM medication is better now.  PERTINENT HISTORY:  MI (in high school), COPD. DM, Sleep apnea, Asthma  PAIN:  Are you having pain? Yes: NPRS scale: at rest 5/10 at worst at night 8/10 Pain location: right knee pain Pain description: aching  Aggravating factors: lying in bed at night, stand longer than 15 minutes.  Relieving factors: resting  PRECAUTIONS: Other: Novocain  RED FLAGS: None   WEIGHT BEARING RESTRICTIONS: No  FALLS:  Has patient fallen in last 6 months? Yes. 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  DIAGNOSTIC FINDINGS:  See medical chart  PATIENT SURVEYS:  PSFS: THE PATIENT SPECIFIC FUNCTIONAL SCALE  Place score of 0-10 (0 = unable to perform activity and 10 = able to perform activity at the same level as before injury or problem)  Activity Date: 09-23-23    Shopping in grocery store for 30 minutes without fatigue       7    2. Mowing the [place]        6    3.Pick up 50 lb autistic dtr        5    4.      Total Score 18/3      Total Score = Sum of activity scores/number of activities  Minimally Detectable Change: 3 points (for single activity); 2 points (for average score)  [name] (nd). The Patient Specific Functional Scale . Retrieved from [email or web address]    COGNITION: Overall cognitive status: Within functional limits for tasks assessed     SENSATION: Pt reports numbness over left thigh to knee and and also over right thigh to knee. Pt reports no numbess in bil feet  MUSCLE LENGTH: Hamstrings: bil tightness Thomas test: Right 15 deg; Left 10 deg  POSTURE: rounded shoulders, forward head, and decreased lumbar lordosis  PALPATION: TTP over medial knee  LUMBAR ROM:   AROM eval  Flexion Fingertips to lower shin  Extension WNL  Right lateral flexion WNL  Left lateral flexion WNL  Right rotation WNL  Left rotation WNL  Key: WFL = within functional limits not formally assessed, * = concordant pain, s = stiffness/stretching sensation, NT = not tested)    LOWER EXTREMITY ROM:     Active  Right eval Left eval  Hip flexion    Hip extension    Hip abduction    Hip adduction    Hip internal rotation    Hip external rotation    Knee flexion 124  130  Knee extension 0 0  Ankle dorsiflexion    Ankle plantarflexion    Ankle inversion    Ankle eversion    Key: WFL = within functional limits not formally assessed, * = concordant pain, s = stiffness/stretching sensation, NT = not tested)  LOWER EXTREMITY MMT:    MMT Right eval Left eval Right 10/13/23  Hip flexion 4+ 4+   Hip extension 4 4   Hip abduction 4 4   Hip adduction     Hip internal rotation     Hip external rotation     Knee flexion 4 with pain 5 5  Knee extension 4 5 4+ pain  Ankle dorsiflexion     Ankle plantarflexion 14/25 18/25   Ankle inversion     Ankle eversion     (Blank rows = not tested, score  listed is out of 5 possible points.  N = WNL, D = diminished, C = clear for gross weakness with myotome testing, * = concordant pain with testing)     FUNCTIONAL TESTS:  5 times sit to stand: 20.27 sec 6 minute walk test: 1040ft  Berg Balance Scale:  Item Test date: 09-23-23 Test date:  Test date:   Sitting to standing 4. able to stand without using hands and stabilize independently Insert OPRCBERGREEVAL SmartPhrase at re-test date Insert OPRCBERGREEVAL SmartPhrase at re-test date  2. Standing unsupported 4. able to stand safely for 2 minutes    3. Sitting with back unsupported, feet supported 4. able to sit safely and securely for 2 minutes    4. Standing to sitting 4. sits safely with minimal use of hands    5. Pivot transfer  4. able to transfer safely with minor use of hands    6. Standing unsupported with eyes closed 4. able to stand 10 seconds safely    7. Standing unsupported with feet together 3. able to place feet together independently and stand 1 minute with supervision    8. Reaching forward with outstretched arms while standing 4. can reach forward confidently 25 cm (10 inches)    9. Pick up object from the floor from standing 4. able to pick up slipper safely and easily    10. Turning to look behind over left and right shoulders while standing 4. looks behind from both sides and weight shifts well    11. Turn 360 degrees 4. able to turn 360 degrees safely in 4 seconds or less    12. Place alternate foot on step or stool while standing unsupported 4. ble to stand independently and safely and complete 8 steps in 20 seconds    13. Standing unsupported one foot in front 1. needs help to step but can hold 15 seconds    14. Standing on one leg 3. able to lift leg independently and hold 5-10 seconds     Total Score 47/56 Total Score /56 Total Score /56     GAIT: Distance walked: 1088 ft (1830-2205 ft Norm) Assistive device utilized: None Level of assistance: Complete  Independence Comments: Pt with some right knee pain and slow and careful with unsteadiness on feet  TREATMENT DATE:  [facility] Adult PT Treatment:                                                DATE: 10/13/23 Therapeutic Exercise: REC Bike L2 x 6 min Standing GTB hip abduction 10 x 3 each  Heel raises x 20  SLR 4# x 20, with ER x 20  LAQ 4# x 20 right   Therapeutic Activity: 25# squat 10 x 2 - min cues for form Hip hinge  with and without dowel, seated and standing -required max cues and will need reinforcement     [facility] Adult PT Treatment:                                                DATE: 10-07-23 Therapeutic Exercise: SLR 3 x 10  with 4 # cuff weight Right only SLR resisted GTB  1 x 10 bil S/L left and right hip abduction 2 x 10 with VC and TC Seated Hamstring Stretch  Seated Knee Extension with GTB  LTR SKTC Standing Heel Raise with Support Manual Therapy: Mc Connel tape medial to lateral pull with pain reduction with exercise at beginning of session. Right knee STW medial quad right  Therapeutic Activity: STS 3 x 10  25# KB Standing hip extension with GTB 3 x 10 3  hold Right and L    [facility] Adult PT Treatment:                                                DATE: 09-29-23 Therapeutic Exercise: LTR Seated Hamstring Stretch  Seated Knee Extension with Resistance   Standing Heel Raise with Support   Therapeutic Activity: Nu step with UE/LE  6 min level 5 while taking subjective STS 3 x 10  15# KB Standing hip abduction standing hip extension Standing hip extension with RTB Standing hip abduction with RTB Gait training on level surfaces attention to stride length and cadence and arm swing Simulation of bowling ball with 15 3 KB  Self Care: Standing and sitting postures Body mechanics and posture and household chore mechanics    09-23-23  Eval and issue of HEP/ fall prevention                                                                                                                                 PATIENT EDUCATION:  Education details: POC Explanation of findings, issue HEP and fall prevention Person educated: Patient Education method: Explanation, Demonstration, Tactile cues, Verbal cues, and Handouts Education comprehension: verbalized understanding, returned demonstration, verbal cues required, tactile cues required, and needs further education  HOME EXERCISE PROGRAM: Access Code: [email or web address] URL: [email or web address] Date: 09/23/2023 Prepared by: Graydon Dingwall  Exercises - Supine Lower Trunk Rotation  - 2 x daily - 7 x weekly - 1 sets - 5 reps - 20 hold - Seated Hamstring Stretch  - 2 x daily - 7 x weekly - 1 sets - 2 reps - 20-30 sec hold - Sit to stand with sink support Movement snack  - 3 x daily - 7 x weekly - 1 sets - 10 reps - Seated Knee Extension with Resistance  -  1 x daily - 7 x weekly - 3 sets - 10 reps - Standing Heel Raise with Support  - 1 x daily - 7 x weekly - 3 sets - 10 reps Added 09-29-23 - Supine Bridge  - 1 x daily - 7 x weekly - 3 sets - 10 reps - Hip Extension with Resistance Loop  - 1 x daily - 7 x weekly - 3 sets - 10 reps - Standing Hip Abduction with Resistance at Ankles and Counter Support  - 1 x daily - 7 x weekly - 3 sets - 10 reps Added 10-07-23 - Supine Straight-Leg Raise With Resistance at Ankles  - 1 x daily - 7 x weekly - 3 sets - 10 reps - Sidelying Hip Abduction  - 1 x daily - 7 x weekly - 2 sets - 10 reps Added 10/13/23 Standing and seated hip hinge practice with dowel  ASSESSMENT:  CLINICAL IMPRESSION: Mr Brion returns to clinic with 5/10 right knee pain. Overall reports he is feeling stronger and is compliant with HEP and walking program. He has not heard from his doctor regarding the results of his xray and blood work last week. Progressed toward LTG of lifting. He was able to squat with min cues however required max cues and use of dowel to assist with feedback on spine alignment/ hip  hinge for lifting. Added hip hinge practice with dowel to HEP and will continue to reinforce and add weight when able to return demonstrate. He reported no increased knee pain during session.    EVAL- Patient is a 63 y.o. male who was seen today for physical therapy evaluation and treatment for generalized weakness following DKA episode with hospitalization.  Pt also with Right acute medial knee pain and some bil numbness bil since hospitalization. Pt with balance deficits of BERG 47/56, and functional weakness noted on PF heel raise. Pt with unsteadiness and fatigue and will benefit from skilled PT to address impairment and return to PLOF  with safety and strength to care for autistic dtr weighing 50 #.   OBJECTIVE IMPAIRMENTS: decreased activity tolerance, decreased balance, decreased endurance, decreased knowledge of condition, difficulty walking, decreased ROM, decreased strength, impaired sensation, improper body mechanics, and pain.   ACTIVITY LIMITATIONS: carrying, lifting, standing, squatting, stairs, transfers, locomotion level, and caring for others  PARTICIPATION LIMITATIONS: cleaning, shopping, community activity, yard work, and caring for autistic dtr  PERSONAL FACTORS: MI (in high school), COPD. DM, Sleep apnea, Asthma are also affecting patient's functional outcome.   REHAB POTENTIAL: Good  CLINICAL DECISION MAKING: Evolving/moderate complexity  EVALUATION COMPLEXITY: Moderate   GOALS: Goals reviewed with patient? Yes  SHORT TERM GOALS: Target date: 10-14-23  Pt will be I with initial HEP Baseline: no knowledge Goal status: ONGOING  2.   Pt will perform 5xSTS in <16 sec in order to demonstrate reduced fall risk and improved functional independence. (MCID of 2.3sec) Baseline: 20.27 sec Goal status: ONGOING  3.  Pt will be educated on fall preparedness Baseline: no knowledge Goal status: INITIAL    LONG TERM GOALS: Target date: 11-04-23  Pt will be I with advanced  HEP Baseline: no knowledge Goal status: INITIAL  2.  Pt will show improved ability to walk for exercise by improving within norms for age Baseline: 1088 ft Norm 1830- 2205 ft Goal status: INITIAL  3.  Pt will be able to shop at [facility] for 30 minutes without fatiguing Baseline: unable to shop due to weakness and fatigue Goal  status: INITIAL  4.  Pt will demonstrate ability to dead lift 50 # to simulate caring for 50 lb autistic daughter Baseline: unable to lift presently without unsteadiness Goal status: INITIAL  5.  Pt will be able to improve balance in order to mow lawn on uneven ground with no loss of balance Baseline: unable to mow lawn at present due to weakness and unsteadiness Goal status: INITIAL  6.  PSFS score with increase to at least 2 points for average score for MDIC.  revised Baseline: 18/3  (6) Goal status: INITIAL  PLAN:  PT FREQUENCY: 1-2x/week  PT DURATION: 6 weeks  PLANNED INTERVENTIONS: 97164- PT Re-evaluation, 97750- Physical Performance Testing, 97110-Therapeutic exercises, 97530- Therapeutic activity, [id number]- Neuromuscular re-education, 97535- Self Care, [id number]- Manual therapy, [id number]- Gait training, [phone]- Electrical stimulation (manual), [phone] (1-2 muscles), [id number] (3+ muscles)- Dry Needling, Patient/Family education, Balance training, Stair training, Taping, Joint mobilization, Spinal mobilization, Cryotherapy, and Moist heat.  PLAN FOR NEXT SESSION:  CKC exercises. Progress balance exercises , perhaps DGI  Harlene Persons, PTA 10/13/23 12:46 PM Phone: [phone] Fax: [phone]   For all possible CPT codes, reference the Planned Interventions line above.     Check all conditions that are expected to impact treatment: {Conditions expected to impact treatment:Diabetes mellitus and Musculoskeletal disorders

## 2023-10-15 ENCOUNTER — Ambulatory Visit: Admitting: Physical Therapy

## 2023-10-15 ENCOUNTER — Encounter: Payer: Self-pay | Admitting: Physical Therapy

## 2023-10-15 DIAGNOSIS — R2681 Unsteadiness on feet: Secondary | ICD-10-CM

## 2023-10-15 DIAGNOSIS — M6281 Muscle weakness (generalized): Secondary | ICD-10-CM | POA: Diagnosis not present

## 2023-10-15 NOTE — Therapy (Signed)
 OUTPATIENT PHYSICAL THERAPY THORACOLUMBAR TREATMENT   Patient Name: Jack Cunningham MRN: [id number] DOB:[birth year], 63 y.o., male Today's Date: 10/15/2023  END OF SESSION:  PT End of Session - 10/15/23 0851     Visit Number 5    Date for PT Re-Evaluation 11/04/23    Authorization Type [facility] [employer]  BERG/ PSFS    PT Start Time [phone]    PT Stop Time 0930    PT Time Calculation (min) 40 min            Past Medical History:  Diagnosis Date   Asthma    COPD (chronic obstructive pulmonary disease) (HCC)    Diabetes mellitus without complication (HCC)    Myocardial infarction (HCC)    1981 at age 68   Pre-diabetes    Sleep apnea    No CPAP   Past Surgical History:  Procedure Laterality Date   no surgeries as of 07/21/19     TOOTH EXTRACTION N/A 08/30/2020   Procedure: DENTAL RESTORATION/EXTRACTIONS;  Surgeon: Sheryle Hamilton, DMD;  Location: [facility] OR;  Service: Oral Surgery;  Laterality: N/A;   Patient Active Problem List   Diagnosis Date Noted   DKA (diabetic ketoacidosis) (HCC) 09/02/2023   COPD (chronic obstructive pulmonary disease) (HCC) 06/24/2017   COPD exacerbation (HCC) 06/23/2017    PCP: Kahoano, [name], MD   REFERRING PROVIDER: Dennise Lavada POUR, MD   REFERRING DIAG: R53.1 (ICD-10-CM) - Weakness   Rationale for Evaluation and Treatment: Rehabilitation  THERAPY DIAG:  Muscle weakness (generalized)  Unsteadiness on feet  ONSET DATE: September 02, 2023  DKA  SUBJECTIVE:                                                                                                                                                                                           SUBJECTIVE STATEMENT:  Feel like a 5/10 pain today. My right medial knee hurts. I have been doing my exercises. The tape helps.   EVAL- I am here because of my Diabetes and weakness. I have poor vision and I have left knee numbness and I have weakness in both my legs. I am feeling a bit off in my  balance and I have overall weakness and I would like to get stronger. I was in the hosptal September 02, 2023 with Diabetic Ketoacidosis for 7 days.  I feel weak and not able to shop at [employer] like I used to.  I have a dtr with autism and I need to help and care for her and she is about 50lb and I cannot do it right now. I am afraid that Iwill  fall. I have fallen twice when I ' blacked  out, but I think my DM medication is better now.  PERTINENT HISTORY:  MI (in high school), COPD. DM, Sleep apnea, Asthma  PAIN:  Are you having pain? Yes: NPRS scale: at rest 5/10 at worst at night 8/10 Pain location: right knee pain Pain description: aching  Aggravating factors: lying in bed at night, stand longer than 15 minutes.  Relieving factors: resting  PRECAUTIONS: Other: Novocain  RED FLAGS: None   WEIGHT BEARING RESTRICTIONS: No  FALLS:  Has patient fallen in last 6 months? Yes. 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  DIAGNOSTIC FINDINGS:  See medical chart  PATIENT SURVEYS:  PSFS: THE PATIENT SPECIFIC FUNCTIONAL SCALE  Place score of 0-10 (0 = unable to perform activity and 10 = able to perform activity at the same level as before injury or problem)  Activity Date: 09-23-23    Shopping in grocery store for 30 minutes without fatigue       7    2. Mowing the [place]        6    3.Pick up 50 lb autistic dtr       5    4.      Total Score 18/3      Total  Score = Sum of activity scores/number of activities  Minimally Detectable Change: 3 points (for single activity); 2 points (for average score)  [name] (nd). The Patient Specific Functional Scale . Retrieved from [email or web address]    COGNITION: Overall cognitive status: Within functional limits for tasks assessed     SENSATION: Pt reports numbness over left thigh to knee and and also over right thigh to knee. Pt reports no numbess in bil feet  MUSCLE LENGTH: Hamstrings: bil tightness Thomas test: Right 15 deg; Left 10 deg  POSTURE: rounded shoulders, forward head, and decreased lumbar lordosis  PALPATION: TTP over medial knee  LUMBAR ROM:   AROM eval  Flexion Fingertips to lower shin  Extension WNL  Right lateral flexion WNL  Left lateral flexion WNL  Right rotation WNL  Left rotation WNL  Key: WFL = within functional limits not formally assessed, * = concordant pain, s = stiffness/stretching sensation, NT = not tested)    LOWER EXTREMITY ROM:     Active  Right eval Left eval  Hip flexion    Hip extension    Hip abduction    Hip adduction    Hip internal rotation    Hip external rotation    Knee flexion 124  130  Knee extension 0 0  Ankle dorsiflexion    Ankle plantarflexion    Ankle inversion    Ankle eversion    Key: WFL = within functional limits not formally assessed, * = concordant pain, s = stiffness/stretching sensation, NT = not tested)     LOWER EXTREMITY MMT:    MMT Right eval Left eval Right 10/13/23  Hip flexion 4+ 4+   Hip extension 4 4   Hip abduction 4 4   Hip adduction     Hip internal rotation     Hip external rotation     Knee flexion 4 with pain 5 5  Knee extension 4 5 4+ pain  Ankle dorsiflexion     Ankle plantarflexion 14/25 18/25   Ankle inversion     Ankle eversion     (Blank rows = not tested, score listed is out of 5 possible points.  N = WNL, D =  diminished, C = clear for gross weakness with myotome testing, * = concordant pain with testing)     FUNCTIONAL TESTS:  5 times sit to stand: 20.27 sec 6 minute walk test: 109ft  Berg Balance Scale:  Item Test date: 09-23-23 Test date:  Test date:   Sitting to standing 4. able to stand without using hands and stabilize independently Insert OPRCBERGREEVAL SmartPhrase at re-test date Insert OPRCBERGREEVAL SmartPhrase at re-test date  2. Standing unsupported 4. able to stand safely for 2 minutes    3. Sitting with back unsupported, feet supported 4. able to sit safely and securely for 2 minutes    4. Standing to sitting 4. sits safely with minimal use of hands    5. Pivot transfer  4. able to transfer safely with minor use of hands    6. Standing unsupported with eyes closed 4. able to stand 10 seconds safely    7. Standing unsupported with feet together 3. able to place feet together independently and stand 1 minute with supervision    8. Reaching forward with outstretched arms while standing 4. can reach forward confidently 25 cm (10 inches)    9. Pick up object from the floor from standing 4. able to pick up slipper safely and easily    10. Turning to look behind over left and right shoulders while standing 4. looks behind from both sides and weight shifts well    11. Turn 360 degrees 4. able to turn 360 degrees safely in 4 seconds or less    12. Place alternate foot on step or stool while standing unsupported 4. ble to stand independently and safely and complete 8 steps in 20 seconds    13. Standing unsupported one foot in front 1. needs help to step but can hold 15 seconds    14. Standing on one leg 3. able to lift leg independently and hold 5-10 seconds     Total Score 47/56 Total Score /56 Total Score /56     GAIT: Distance walked: 1088 ft (1830-2205 ft Norm) Assistive device utilized: None Level of assistance: Complete Independence Comments: Pt with some right knee pain and  slow and careful with unsteadiness on feet  TREATMENT DATE:  [facility] Adult PT Treatment:                                                DATE: 10/15/23 Therapeutic Exercise: Rec Bike L2 x 5 min SLR 4# x 20 each  Hip abduction side lying 4# x 20 each   Neuromuscular re-ed: Tandem stance >30 sec each , added Foam >30 sec each SLS 30 sec each level surface  Therapeutic Activity: Hip hinge with dowel- pt brought is own dowel today  25# squat x 15 15# Dead lift 10 x  2     [facility] Adult PT Treatment:                                                DATE: 10/13/23 Therapeutic Exercise: REC Bike L2 x 6 min Standing GTB hip abduction 10 x 3 each  Heel raises x 20  SLR 4# x 20, with ER x 20  LAQ 4# x 20 right   Therapeutic Activity: 25# squat 10 x 2 - min cues for form Hip hinge with and without dowel, seated and standing -required max cues and will need reinforcement     [facility] Adult PT Treatment:                                                DATE: 10-07-23 Therapeutic Exercise: SLR 3 x 10  with 4 # cuff weight Right only SLR resisted GTB  1 x 10 bil S/L left and right hip abduction 2 x 10 with VC and TC Seated Hamstring Stretch  Seated Knee Extension with GTB  LTR SKTC Standing Heel Raise with Support Manual Therapy: Mc Connel tape medial to lateral pull with pain reduction with exercise at beginning of session. Right knee STW medial quad right  Therapeutic Activity: STS 3 x 10  25# KB Standing hip extension with GTB 3 x 10 3  hold Right and L    [facility] Adult PT Treatment:                                                DATE: 09-29-23 Therapeutic Exercise: LTR Seated Hamstring Stretch  Seated Knee Extension with Resistance   Standing Heel Raise with Support   Therapeutic Activity: Nu step with UE/LE  6 min level 5 while taking subjective STS 3 x 10  15# KB Standing hip abduction standing hip extension Standing hip extension with RTB Standing hip abduction with RTB Gait  training on level surfaces attention to stride length and cadence and arm swing Simulation of bowling ball with 15 3 KB  Self Care: Standing and sitting postures Body mechanics and posture and household chore mechanics    09-23-23  Eval and issue of HEP/ fall prevention                                                                                                                                PATIENT EDUCATION:  Education details: POC Explanation of findings, issue HEP and fall prevention Person educated: Patient Education method: Explanation, Demonstration, Tactile cues, Verbal cues, and Handouts Education  comprehension: verbalized understanding, returned demonstration, verbal cues required, tactile cues required, and needs further education  HOME EXERCISE PROGRAM: Access Code: [email or web address] URL: [email or web address] Date: 09/23/2023 Prepared by: Graydon Dingwall  Exercises - Supine Lower Trunk Rotation  - 2 x daily - 7 x weekly - 1 sets - 5 reps - 20 hold - Seated Hamstring Stretch  - 2 x daily - 7 x weekly - 1 sets - 2 reps - 20-30 sec hold - Sit to stand with sink support Movement snack  - 3 x daily - 7 x weekly - 1 sets - 10 reps - Seated Knee Extension with Resistance  - 1 x daily - 7 x weekly - 3 sets - 10 reps - Standing Heel Raise with Support  - 1 x daily - 7 x weekly - 3 sets - 10 reps Added 09-29-23 - Supine Bridge  - 1 x daily - 7 x weekly - 3 sets - 10 reps - Hip Extension with Resistance Loop  - 1 x daily - 7 x weekly - 3 sets - 10 reps - Standing Hip Abduction with Resistance at Ankles and Counter Support  - 1 x daily - 7 x weekly - 3 sets - 10 reps Added 10-07-23 - Supine Straight-Leg Raise With Resistance at Ankles  - 1 x daily - 7 x weekly - 3 sets - 10 reps - Sidelying Hip Abduction  - 1 x daily - 7 x weekly - 2 sets - 10 reps Added 10/13/23 Standing and seated hip hinge practice with dowel  ASSESSMENT:  CLINICAL IMPRESSION: Mr Lindblad returns to  clinic with 4/10 right knee pain. He brings in his own dowel today as he has been practicing the hip hinge. Less cues required for lifting today, able to add resistance and begin sets. Began static balance adding unlevel surface for challenge. Will plan to progress with dynamic balance challenges next visit. Good tolerance to increased activity today. His 5 x STS has improved, all STGs met.     EVAL- Patient is a 63 y.o. male who was seen today for physical therapy evaluation and treatment for generalized weakness following DKA episode with hospitalization.  Pt also with Right acute medial knee pain and some bil numbness bil since hospitalization. Pt with balance deficits of BERG 47/56, and functional weakness noted on PF heel raise. Pt with unsteadiness and fatigue and will benefit from skilled PT to address impairment and return to PLOF  with safety and strength to care for autistic dtr weighing 50 #.   OBJECTIVE IMPAIRMENTS: decreased activity tolerance, decreased balance, decreased endurance, decreased knowledge of condition, difficulty walking, decreased ROM, decreased strength, impaired sensation, improper body mechanics, and pain.   ACTIVITY LIMITATIONS: carrying, lifting, standing, squatting, stairs, transfers, locomotion level, and caring for others  PARTICIPATION LIMITATIONS: cleaning, shopping, community activity, yard work, and caring for autistic dtr  PERSONAL FACTORS: MI (in high school), COPD. DM, Sleep apnea, Asthma are also affecting patient's functional outcome.   REHAB POTENTIAL: Good  CLINICAL DECISION MAKING: Evolving/moderate complexity  EVALUATION COMPLEXITY: Moderate   GOALS: Goals reviewed with patient? Yes  SHORT TERM GOALS: Target date: 10-14-23  Pt will be I with initial HEP Baseline: no knowledge Goal status: MET   2.   Pt will perform 5xSTS in <16 sec in order to demonstrate reduced fall risk and improved functional independence. (MCID of 2.3sec) Baseline:  20.27 sec 10/15/23: 8.2 sec  Goal status: MET   3.  Pt will be educated on fall  preparedness Baseline: no knowledge Goal status: MET    LONG TERM GOALS: Target date: 11-04-23  Pt will be I with advanced HEP Baseline: no knowledge Goal status: INITIAL  2.  Pt will show improved ability to walk for exercise by improving within norms for age Baseline: 1088 ft Norm 1830- 2205 ft Goal status: INITIAL  3.  Pt will be able to shop at [facility] for 30 minutes without fatiguing Baseline: unable to shop due to weakness and fatigue Goal status: INITIAL  4.  Pt will demonstrate ability to dead lift 50 # to simulate caring for 50 lb autistic daughter Baseline: unable to lift presently without unsteadiness Goal status: INITIAL  5.  Pt will be able to improve balance in order to mow lawn on uneven ground with no loss of balance Baseline: unable to mow lawn at present due to weakness and unsteadiness Goal status: INITIAL  6.  PSFS score with increase to at least 2 points for average score for MDIC.  revised Baseline: 18/3  (6) Goal status: INITIAL  PLAN:  PT FREQUENCY: 1-2x/week  PT DURATION: 6 weeks  PLANNED INTERVENTIONS: [id number]- PT Re-evaluation, 97750- Physical Performance Testing, 97110-Therapeutic exercises, 97530- Therapeutic activity, [id number]- Neuromuscular re-education, 97535- Self Care, [id number]- Manual therapy, [id number]- Gait training, [phone]- Electrical stimulation (manual), [phone] (1-2 muscles), 20561 (3+ muscles)- Dry Needling, Patient/Family education, Balance training, Stair training, Taping, Joint mobilization, Spinal mobilization, Cryotherapy, and Moist heat.  PLAN FOR NEXT SESSION:  CKC exercises. Progress balance exercises , perhaps DGI  Harlene Persons, PTA 10/15/23 12:01 PM Phone: [phone] Fax: [phone]   For all possible CPT codes, reference the Planned Interventions line above.     Check all conditions that are expected to impact treatment: {Conditions  expected to impact treatment:Diabetes mellitus and Musculoskeletal disorders

## 2023-10-19 ENCOUNTER — Ambulatory Visit: Admitting: Physical Therapy

## 2023-10-19 ENCOUNTER — Encounter: Payer: Self-pay | Admitting: Physical Therapy

## 2023-10-19 DIAGNOSIS — M6281 Muscle weakness (generalized): Secondary | ICD-10-CM

## 2023-10-19 DIAGNOSIS — M25561 Pain in right knee: Secondary | ICD-10-CM

## 2023-10-19 DIAGNOSIS — R2689 Other abnormalities of gait and mobility: Secondary | ICD-10-CM

## 2023-10-19 DIAGNOSIS — R2681 Unsteadiness on feet: Secondary | ICD-10-CM

## 2023-10-19 NOTE — Therapy (Signed)
 OUTPATIENT PHYSICAL THERAPY THORACOLUMBAR TREATMENT   Patient Name: Jack Cunningham MRN: [id number] DOB:07-21-1960, 63 y.o., male Today's Date: 10/19/2023  END OF SESSION:  PT End of Session - 10/19/23 0842     Visit Number 6    Date for PT Re-Evaluation 11/04/23    Authorization Type [place] [facility] [employer]  BERG/ PSFS    PT Start Time 0845    PT Stop Time 0926    PT Time Calculation (min) 41 min            Past Medical History:  Diagnosis Date   Asthma    COPD (chronic obstructive pulmonary disease) (HCC)    Diabetes mellitus without complication (HCC)    Myocardial infarction (HCC)    1981 at age 68   Pre-diabetes    Sleep apnea    No CPAP   Past Surgical History:  Procedure Laterality Date   no surgeries as of 07/21/19     TOOTH EXTRACTION N/A 08/30/2020   Procedure: DENTAL RESTORATION/EXTRACTIONS;  Surgeon: Sheryle Hamilton, DMD;  Location: [facility] OR;  Service: Oral Surgery;  Laterality: N/A;   Patient Active Problem List   Diagnosis Date Noted   DKA (diabetic ketoacidosis) (HCC) 09/02/2023   COPD (chronic obstructive pulmonary disease) (HCC) 06/24/2017   COPD exacerbation (HCC) 06/23/2017    PCP: Kahoano, [name], MD   REFERRING PROVIDER: Dennise Lavada POUR, MD   REFERRING DIAG: R53.1 (ICD-10-CM) - Weakness   Rationale for Evaluation and Treatment: Rehabilitation  THERAPY DIAG:  Muscle weakness (generalized)  Unsteadiness on feet  Acute pain of right knee  Other abnormalities of gait and mobility  ONSET DATE: September 02, 2023  DKA  SUBJECTIVE:                                                                                                                                                                                           SUBJECTIVE STATEMENT:  10/19/2023:  Pt reports that he is feeling stronger.  His R knee continues to hurt @ 5/10.  EVAL- I am here because of my Diabetes and weakness. I have poor vision and I have left knee numbness and I have  weakness in both my legs. I am feeling a bit off in my balance and I have overall weakness and I would like to get stronger. I was in the hosptal September 02, 2023 with Diabetic Ketoacidosis for 7 days.  I feel weak and not able to shop at [employer] like I used to.  I have a dtr with autism and I need to help and care for her and she is about 50lb and I  cannot do it right now. I am afraid that Iwill fall. I have fallen twice when I ' blacked  out, but I think my DM medication is better now.  PERTINENT HISTORY:  MI (in high school), COPD. DM, Sleep apnea, Asthma  PAIN:  Are you having pain? Yes: NPRS scale: at rest 5/10 at worst at night 8/10 Pain location: right knee pain Pain description: aching  Aggravating factors: lying in bed at night, stand longer than 15 minutes.  Relieving factors: resting  PRECAUTIONS: Other: Novocain  RED FLAGS: None   WEIGHT BEARING RESTRICTIONS: No  FALLS:  Has patient fallen in last 6 months? Yes. 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  DIAGNOSTIC FINDINGS:  See medical chart  PATIENT SURVEYS:  PSFS: THE PATIENT SPECIFIC FUNCTIONAL SCALE  Place score of 0-10 (0 = unable to perform activity and 10 = able to perform activity at the same level as before injury or problem)  Activity Date: 09-23-23    Shopping in grocery store for 30 minutes without fatigue       7    2. Mowing the [place]        6    3.Pick up 50 lb autistic dtr        5    4.      Total Score 18/3      Total Score = Sum of activity scores/number of activities  Minimally Detectable Change: 3 points (for single activity); 2 points (for average score)  [name] (nd). The Patient Specific Functional Scale . Retrieved from [email or web address]    COGNITION: Overall cognitive status: Within functional limits for tasks assessed     SENSATION: Pt reports numbness over left thigh to knee and and also over right thigh to knee. Pt reports no numbess in bil feet  MUSCLE LENGTH: Hamstrings: bil tightness Thomas test: Right 15 deg; Left 10 deg  POSTURE: rounded shoulders, forward head, and decreased lumbar lordosis  PALPATION: TTP over medial knee  LUMBAR ROM:   AROM eval  Flexion Fingertips to lower shin  Extension WNL  Right lateral flexion WNL  Left lateral flexion WNL  Right rotation WNL  Left rotation WNL  Key: WFL = within functional limits not formally assessed, * = concordant pain, s = stiffness/stretching sensation, NT = not tested)    LOWER EXTREMITY ROM:     Active  Right eval Left eval  Hip flexion    Hip extension    Hip abduction    Hip adduction    Hip internal rotation    Hip external rotation    Knee flexion 124  130  Knee extension 0 0  Ankle dorsiflexion    Ankle plantarflexion    Ankle inversion    Ankle eversion    Key: WFL = within functional limits not formally assessed, * = concordant pain, s = stiffness/stretching sensation, NT = not tested)     LOWER EXTREMITY MMT:  MMT Right eval Left eval Right 10/13/23  Hip flexion 4+ 4+   Hip extension 4 4   Hip abduction 4 4   Hip adduction     Hip internal rotation     Hip external rotation     Knee flexion 4 with pain 5 5  Knee extension 4 5 4+ pain  Ankle dorsiflexion     Ankle plantarflexion 14/25 18/25   Ankle inversion     Ankle eversion     (Blank rows = not tested, score  listed is out of 5 possible points.  N = WNL, D = diminished, C = clear for gross weakness with myotome testing, * = concordant pain with testing)     FUNCTIONAL TESTS:  5 times sit to stand: 20.27 sec 6 minute walk test: 1077ft  Berg Balance Scale:  Item Test date: 09-23-23 Test date:  Test date:   Sitting to standing 4. able to stand without using hands and stabilize independently Insert OPRCBERGREEVAL SmartPhrase at re-test date Insert OPRCBERGREEVAL SmartPhrase at re-test date  2. Standing unsupported 4. able to stand safely for 2 minutes    3. Sitting with back unsupported, feet supported 4. able to sit safely and securely for 2 minutes    4. Standing to sitting 4. sits safely with minimal use of hands    5. Pivot transfer  4. able to transfer safely with minor use of hands    6. Standing unsupported with eyes closed 4. able to stand 10 seconds safely    7. Standing unsupported with feet together 3. able to place feet together independently and stand 1 minute with supervision    8. Reaching forward with outstretched arms while standing 4. can reach forward confidently 25 cm (10 inches)    9. Pick up object from the floor from standing 4. able to pick up slipper safely and easily    10. Turning to look behind over left and right shoulders while standing 4. looks behind from both sides and weight shifts well    11. Turn 360 degrees 4. able to turn 360 degrees safely in 4 seconds or less    12. Place alternate foot on step or stool while standing unsupported 4. ble to stand independently and safely and complete 8 steps in 20 seconds    13. Standing unsupported one foot in front 1. needs help to step but can hold 15 seconds    14. Standing on one leg 3. able to lift leg independently and hold 5-10 seconds     Total Score 47/56 Total Score /56 Total Score /56     GAIT: Distance walked: 1088 ft (1830-2205 ft Norm) Assistive device utilized: None Level of assistance: Complete  Independence Comments: Pt with some right knee pain and slow and careful with unsteadiness on feet  TREATMENT DATE:   [facility] Adult PT Treatment:                                                DATE: 10/19/23 Therapeutic Exercise: Rec Bike L2 x 5 min SLR 5# - cues for slow pacing - 2x10 ea Hip abduction side lying 5# x 15 each  LAQ - 5# - 25x ea (consider knee ext next visit Standing heel raise with no support  Neuromuscular re-ed: Tandem with [place] reach and rotation  Therapeutic Activity:  Hip hinge with dowel- pt brought is own dowel today - mirror feed back to prevent squat 15# squat  to table 2x10 - mirror to prevent w/s 15# Dead lift 10 x 2  [facility] Adult PT Treatment:                                                DATE: 10/15/23 Therapeutic Exercise: Rec Bike L2 x 5 min SLR 4# x 20 each  Hip abduction side lying 4# x 20 each   Neuromuscular re-ed: Tandem stance >30 sec each , added Foam >30 sec each SLS 30 sec each level surface  Therapeutic Activity: Hip hinge with dowel- pt brought is own dowel today  25# squat x 15 15# Dead lift 10 x 2     [facility] Adult PT Treatment:                                                DATE: 10/13/23 Therapeutic Exercise: REC Bike L2 x 6 min Standing GTB hip abduction 10 x 3 each  Heel raises x 20  SLR 4# x 20, with ER x 20  LAQ 4# x 20 right   Therapeutic Activity: 25# squat 10 x 2 - min cues for form Hip hinge with and without dowel, seated and standing -required max cues and will need reinforcement     [facility] Adult PT Treatment:                                                DATE: 10-07-23 Therapeutic Exercise: SLR 3 x 10  with 4 # cuff weight Right only SLR resisted GTB  1 x 10 bil S/L left and right hip abduction 2 x 10 with VC and TC Seated Hamstring Stretch  Seated Knee Extension with GTB  LTR SKTC Standing Heel Raise with Support Manual Therapy: Mc Connel tape medial to lateral pull with pain reduction with exercise at  beginning of session. Right knee STW medial quad right  Therapeutic Activity: STS 3 x 10  25# KB Standing hip extension with GTB 3 x 10 3  hold Right and L    [facility] Adult PT Treatment:                                                DATE: 09-29-23 Therapeutic Exercise: LTR Seated Hamstring Stretch  Seated Knee Extension with Resistance   Standing Heel Raise with Support   Therapeutic Activity: Nu step with UE/LE  6 min level 5 while taking subjective STS 3 x 10  15# KB Standing hip abduction standing hip extension Standing hip extension with RTB Standing hip abduction with RTB Gait training on level surfaces attention to stride length and cadence and arm swing Simulation of bowling ball with 15 3 KB  Self Care: Standing and sitting postures Body mechanics and posture and household chore mechanics    09-23-23  Eval and issue of HEP/ fall prevention  PATIENT EDUCATION:  Education details: POC Explanation of findings, issue HEP and fall prevention Person educated: Patient Education method: Explanation, Demonstration, Tactile cues, Verbal cues, and Handouts Education comprehension: verbalized understanding, returned demonstration, verbal cues required, tactile cues required, and needs further education  HOME EXERCISE PROGRAM: Access Code: [email or web address] URL: [email or web address] Date: 09/23/2023 Prepared by: Graydon Dingwall  Exercises - Supine Lower Trunk Rotation  - 2 x daily - 7 x weekly - 1 sets - 5 reps - 20 hold - Seated Hamstring Stretch  - 2 x daily - 7 x weekly - 1 sets - 2 reps - 20-30 sec hold - Sit to stand with sink support Movement snack  - 3 x daily - 7 x weekly - 1 sets - 10 reps - Seated Knee Extension with Resistance  - 1 x daily - 7 x weekly - 3 sets - 10 reps - Standing Heel Raise with Support  - 1 x daily - 7 x weekly -  3 sets - 10 reps Added 09-29-23 - Supine Bridge  - 1 x daily - 7 x weekly - 3 sets - 10 reps - Hip Extension with Resistance Loop  - 1 x daily - 7 x weekly - 3 sets - 10 reps - Standing Hip Abduction with Resistance at Ankles and Counter Support  - 1 x daily - 7 x weekly - 3 sets - 10 reps Added 10-07-23 - Supine Straight-Leg Raise With Resistance at Ankles  - 1 x daily - 7 x weekly - 3 sets - 10 reps - Sidelying Hip Abduction  - 1 x daily - 7 x weekly - 2 sets - 10 reps Added 10/13/23 Standing and seated hip hinge practice with dowel  ASSESSMENT:  CLINICAL IMPRESSION:  10/19/2023:  Merilee tolerated session well with no adverse reaction.  Progressing well with strength and balance.  Pt cued verbally and visually with mirror for form with hip hinge vs squat with good response.  Added in perturbation with tandem stance which was challenging but completed with no LOB.  EVAL- Patient is a 63 y.o. male who was seen today for physical therapy evaluation and treatment for generalized weakness following DKA episode with hospitalization.  Pt also with Right acute medial knee pain and some bil numbness bil since hospitalization. Pt with balance deficits of BERG 47/56, and functional weakness noted on PF heel raise. Pt with unsteadiness and fatigue and will benefit from skilled PT to address impairment and return to PLOF  with safety and strength to care for autistic dtr weighing 50 #.   OBJECTIVE IMPAIRMENTS: decreased activity tolerance, decreased balance, decreased endurance, decreased knowledge of condition, difficulty walking, decreased ROM, decreased strength, impaired sensation, improper body mechanics, and pain.   ACTIVITY LIMITATIONS: carrying, lifting, standing, squatting, stairs, transfers, locomotion level, and caring for others  PARTICIPATION LIMITATIONS: cleaning, shopping, community activity, yard work, and caring for autistic dtr  PERSONAL FACTORS: MI (in high school), COPD. DM, Sleep  apnea, Asthma are also affecting patient's functional outcome.   REHAB POTENTIAL: Good  CLINICAL DECISION MAKING: Evolving/moderate complexity  EVALUATION COMPLEXITY: Moderate   GOALS: Goals reviewed with patient? Yes  SHORT TERM GOALS: Target date: 10-14-23  Pt will be I with initial HEP Baseline: no knowledge Goal status: MET   2.   Pt will perform 5xSTS in <16 sec in order to demonstrate reduced fall risk and improved functional independence. (MCID of 2.3sec) Baseline: 20.27 sec 10/15/23: 8.2 sec  Goal status: MET   3.  Pt  will be educated on fall preparedness Baseline: no knowledge Goal status: MET    LONG TERM GOALS: Target date: 11-04-23  Pt will be I with advanced HEP Baseline: no knowledge Goal status: INITIAL  2.  Pt will show improved ability to walk for exercise by improving within norms for age Baseline: 1088 ft Norm 1830- 2205 ft Goal status: INITIAL  3.  Pt will be able to shop at [facility] for 30 minutes without fatiguing Baseline: unable to shop due to weakness and fatigue Goal status: INITIAL  4.  Pt will demonstrate ability to dead lift 50 # to simulate caring for 50 lb autistic daughter Baseline: unable to lift presently without unsteadiness Goal status: INITIAL  5.  Pt will be able to improve balance in order to mow lawn on uneven ground with no loss of balance Baseline: unable to mow lawn at present due to weakness and unsteadiness Goal status: INITIAL  6.  PSFS score with increase to at least 2 points for average score for MDIC.  revised Baseline: 18/3  (6) Goal status: INITIAL  PLAN:  PT FREQUENCY: 1-2x/week  PT DURATION: 6 weeks  PLANNED INTERVENTIONS: 97164- PT Re-evaluation, 97750- Physical Performance Testing, 97110-Therapeutic exercises, 97530- Therapeutic activity, [id number]- Neuromuscular re-education, 97535- Self Care, 02859- Manual therapy, [id number]- Gait training, [phone]- Electrical stimulation (manual), [phone] (1-2 muscles), 20561  (3+ muscles)- Dry Needling, Patient/Family education, Balance training, Stair training, Taping, Joint mobilization, Spinal mobilization, Cryotherapy, and Moist heat.  PLAN FOR NEXT SESSION:  CKC exercises. Progress balance exercises , perhaps DGI  Helene FORBES Gasmen PT 10/19/23 11:56 AM Phone: [phone] Fax: [phone]   For all possible CPT codes, reference the Planned Interventions line above.     Check all conditions that are expected to impact treatment: {Conditions expected to impact treatment:Diabetes mellitus and Musculoskeletal disorders

## 2023-10-20 NOTE — Telephone Encounter (Signed)
 Spoke with wife and advise form must be completed for forms to be faxed. She verbalized understanding.

## 2023-10-25 ENCOUNTER — Encounter: Payer: Self-pay | Admitting: Physical Therapy

## 2023-10-25 NOTE — Progress Notes (Addendum)
 Name: Jack Cunningham Date of visit: 10/25/23  Chief Complaint   Chief Complaint  Patient presents with  . Follow-up    Subjective  Jack Cunningham is a 63 y.o. male who presents today for follow up on chronic conditions. Patient is familiar to me, last seen on 10/06/2023.    History of Present Illness The patient presents for evaluation of diabetes mellitus and right knee pain.  He has been monitoring his blood sugar levels using Dexcom, but the device recently detached. His blood sugar levels have been well-controlled, with a recent reading of 104. He is currently on Lantus  20 units and NovoLog , which he uses as needed based on his blood sugar levels. He reports no gastrointestinal issues such as nausea, vomiting, or diarrhea. He also reports no headaches or vision changes. He is not experiencing any chest heaviness or shortness of breath during his rehabilitation exercises or work activities.  Patient continues to have fatigue and weakness and is unable to sustain any prolonged exercise which is why physical therapy as an outpatient is so important to help with that strengthening process.  More time to get his sugar readings under good control is important as well.  This will help reduce episodes of hypoglycemia, dizziness and weakness.  I will also reduce the need for him to use the bathroom for frequent urination which happens with elevated sugars.  An additional 3 weeks at home to work on these issues will allow him to go back to work without any restrictions in my opinion.  He continues to experience intermittent pain in his right knee, which was particularly severe last night. An x-ray indicated possible gout, although uric acid levels were normal. He has not yet started taking Celebrex. He has been attending physical therapy sessions three times a week and has been performing home exercises, including posture correction and squats. His therapist has noted improvement  in his condition. He is scheduled to return to work next week but is considering extending his leave until October for better recovery.  Again, he needs time to strengthen his muscles to go back to the work that he was doing which was physically demanding.  He is unable to sustain that kind of work right now.  I wanted to recheck lipase and CBC today as they were improving last visit.    Problem List[1]   Patient denies new or worsening headaches, vision changes, N/V, chest pain, palpitations, SOB, abdominal pain or issues with bowel/bladder.    Current Outpatient Medications  Medication Instructions  . Accu-Chek Guide test strips test strip   . Accu-Chek Softclix Lancets misc   . aspirin  81 mg  . blood-glucose sensor (Dexcom G7 Sensor) Use as directed to monitor blood sugar levels. Follow package directions for replacement.  . celecoxib (CELEBREX) 200 mg, oral, Daily PRN  . ergocalciferol (VITAMIN D2) 50,000 Units, oral, Weekly  . fenofibrate  nanocrystallized (TRICOR ) 48 mg  . fluticasone  HFA (FLOVENT HFA) 110 mcg/actuation inhaler 1 puff, 2 times daily  . gabapentin (NEURONTIN) 100 mg, oral, Nightly, Increase to 2 capsules nightly after 2 weeks.  . Lantus  Solostar U-100 Insulin  20 Units  . losartan (COZAAR) 50 mg tablet 1 tablet, Daily  . NovoLOG  Flexpen U-100 Insulin  100 unit/mL (3 mL) pen   . pravastatin  (PRAVACHOL ) 40 mg, oral, Nightly  . Symbicort  160-4.5 mcg/actuation inhaler 2 puffs, 2 times daily   Allergies[2]  ROS  Other systems reviewed and negative. No other complaints.  Objective  BP 136/88   Pulse 81   Temp 97 F (36.1 C) (Temporal)   Wt 93.2 kg (205 lb 8 oz)   SpO2 100%   BMI 29.49 kg/m      Physical Exam: Gen: appears comfortable. Cardio: Normal rate, regular rhythm. Normal S1/S2. No appreciable murmurs. Resp: Lungs clear to auscultation bilaterally.  Abdomen: NABS, no tenderness or organomegaly MSK:  No edema. Neurologic:  No focal  deficits. Psych: Answers questions appropriately, cooperative. Appropriate affect.   Assessment & Plan   Orders Placed This Encounter  Procedures  . Basic Metabolic Panel  . CBC with Differential  . Lipase  . CBC with Differential     Orders Placed This Encounter  Medications  . NovoLOG  Flexpen U-100 Insulin  100 unit/mL (3 mL) pen    Sig: Inject 2 Units under the skin 3 (three) times a day before meals.    Dispense:  3 mL    Refill:  3  . Lantus  Solostar U-100 Insulin  100 unit/mL (3 mL) pen    Sig: Inject 20 Units under the skin in the morning.    Dispense:  12 mL    Refill:  1       Assessment & Plan 1. Diabetes Mellitus: - The A1c test will be deferred until October 2025. He is advised to ensure proper application of the Dexcom device to prevent it from falling off. - Prescriptions for Lantus  and NovoLog  have been renewed.  2. Right Knee Pain: - He is advised to take Celebrex once daily for 2 to 3 days to assess its effectiveness. If Celebrex proves ineffective, a referral to an orthopedist will be considered.  3. Pancreatitis: - A recheck of the pancreas enzyme levels will be conducted today to ensure continued improvement.  4.  Anemia unspecified - Recheck CBC today.   I will fill out FMLA paperwork for his job to adjust back to work date from next week to the end of September.   Follow up 6 weeks, sooner if needed. patient verbalized agreement to above plan. 30 minutes was spent reviewing and interpreting prior notes/images/labs, counseling the patient, transmitting prescriptions and arranging follow up.    This note was dictated with voice recognition software. Similar sounding words may be inadvertently transcribed incorrectly.   Avelina Cottie Murray, PA-C       [1] Patient Active Problem List Diagnosis  . COPD (chronic obstructive pulmonary disease)    (CMD)  . COPD exacerbation    (CMD)  . DKA (diabetic ketoacidosis)    (CMD)  . Vitamin D  deficiency  . Uncontrolled type 2 diabetes mellitus with hyperglycemia    (CMD)  . Mixed hyperlipidemia  [2] Allergies Allergen Reactions  . Procaine Other (See Comments) and Swelling    Felt like throat was swelling up  Made patients mouth swell during getting this for a dental procedure and was taken to the ED.

## 2023-10-27 ENCOUNTER — Ambulatory Visit: Payer: Self-pay | Admitting: Physical Therapy

## 2023-10-27 ENCOUNTER — Encounter: Payer: Self-pay | Admitting: Physical Therapy

## 2023-10-27 DIAGNOSIS — M25561 Pain in right knee: Secondary | ICD-10-CM

## 2023-10-27 DIAGNOSIS — R2681 Unsteadiness on feet: Secondary | ICD-10-CM

## 2023-10-27 DIAGNOSIS — M6281 Muscle weakness (generalized): Secondary | ICD-10-CM

## 2023-10-27 DIAGNOSIS — R2689 Other abnormalities of gait and mobility: Secondary | ICD-10-CM

## 2023-10-27 NOTE — Therapy (Signed)
 OUTPATIENT PHYSICAL THERAPY THORACOLUMBAR TREATMENT   Patient Name: Jack Cunningham MRN: [id number] DOB:Dec 17, 1960, 63 y.o., male Today's Date: 10/27/2023  END OF SESSION:  PT End of Session - 10/27/23 0930     Visit Number 7    Number of Visits 13    Date for PT Re-Evaluation 11/04/23    Authorization Type [place] [facility] [employer]  BERG/ PSFS    PT Start Time 0930    PT Stop Time 1011    PT Time Calculation (min) 41 min            Past Medical History:  Diagnosis Date   Asthma    COPD (chronic obstructive pulmonary disease) (HCC)    Diabetes mellitus without complication (HCC)    Myocardial infarction (HCC)    1981 at age 27   Pre-diabetes    Sleep apnea    No CPAP   Past Surgical History:  Procedure Laterality Date   no surgeries as of 07/21/19     TOOTH EXTRACTION N/A 08/30/2020   Procedure: DENTAL RESTORATION/EXTRACTIONS;  Surgeon: Sheryle Hamilton, DMD;  Location: [facility] OR;  Service: Oral Surgery;  Laterality: N/A;   Patient Active Problem List   Diagnosis Date Noted   DKA (diabetic ketoacidosis) (HCC) 09/02/2023   COPD (chronic obstructive pulmonary disease) (HCC) 06/24/2017   COPD exacerbation (HCC) 06/23/2017    PCP: Kahoano, [name], MD   REFERRING PROVIDER: Dennise Lavada POUR, MD   REFERRING DIAG: R53.1 (ICD-10-CM) - Weakness   Rationale for Evaluation and Treatment: Rehabilitation  THERAPY DIAG:  Muscle weakness (generalized)  Unsteadiness on feet  Acute pain of right knee  Other abnormalities of gait and mobility  ONSET DATE: September 02, 2023  DKA  SUBJECTIVE:                                                                                                                                                                                           SUBJECTIVE STATEMENT:  10/27/2023:  Pt reports that he continues to see some improvement.  Current R knee pain is about a 2/10.  EVAL- I am here because of my Diabetes and weakness. I have poor vision and I  have left knee numbness and I have weakness in both my legs. I am feeling a bit off in my balance and I have overall weakness and I would like to get stronger. I was in the hosptal September 02, 2023 with Diabetic Ketoacidosis for 7 days.  I feel weak and not able to shop at [employer] like I used to.  I have a dtr with autism and I need to help and care  for her and she is about 50lb and I cannot do it right now. I am afraid that Iwill fall. I have fallen twice when I ' blacked  out, but I think my DM medication is better now.  PERTINENT HISTORY:  MI (in high school), COPD. DM, Sleep apnea, Asthma  PAIN:  Are you having pain? Yes: NPRS scale: at rest 5/10 at worst at night 8/10 Pain location: right knee pain Pain description: aching  Aggravating factors: lying in bed at night, stand longer than 15 minutes.  Relieving factors: resting  PRECAUTIONS: Other: Novocain  RED FLAGS: None   WEIGHT BEARING RESTRICTIONS: No  FALLS:  Has patient fallen in last 6 months? Yes. 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  DIAGNOSTIC FINDINGS:  See medical chart  PATIENT SURVEYS:  PSFS: THE PATIENT SPECIFIC FUNCTIONAL SCALE  Place score of 0-10 (0 = unable to perform activity and 10 = able to perform activity at the same level as before injury or problem)  Activity Date: 09-23-23    Shopping in grocery store for 30 minutes without fatigue       7    2. Mowing the Lawn         6    3.Pick up 50 lb autistic dtr       5    4.      Total Score 18/3      Total Score = Sum of activity scores/number of activities  Minimally Detectable Change: 3 points (for single activity); 2 points (for average score)  [name] (nd). The Patient Specific Functional Scale . Retrieved from [email or web address]    COGNITION: Overall cognitive status: Within functional limits for tasks assessed     SENSATION: Pt reports numbness over left thigh to knee and and also over right thigh to knee. Pt reports no numbess in bil feet  MUSCLE LENGTH: Hamstrings: bil tightness Thomas test: Right 15 deg; Left 10 deg  POSTURE: rounded shoulders, forward head, and decreased lumbar lordosis  PALPATION: TTP over medial knee  LUMBAR ROM:   AROM eval  Flexion Fingertips to lower shin  Extension WNL  Right lateral flexion WNL  Left lateral flexion WNL  Right rotation WNL  Left rotation WNL  Key: WFL = within functional limits not formally assessed, * = concordant pain, s = stiffness/stretching sensation, NT = not tested)    LOWER EXTREMITY ROM:     Active  Right eval Left eval  Hip flexion    Hip extension    Hip abduction    Hip adduction    Hip internal rotation    Hip external rotation    Knee flexion 124  130  Knee extension 0 0  Ankle dorsiflexion    Ankle plantarflexion    Ankle inversion    Ankle eversion    Key: WFL = within functional limits not formally assessed, * = concordant pain, s = stiffness/stretching sensation, NT =  not tested)     LOWER EXTREMITY MMT:    MMT Right eval Left eval Right 10/13/23  Hip flexion 4+ 4+   Hip extension 4 4   Hip abduction 4 4   Hip adduction     Hip internal rotation     Hip external rotation     Knee flexion 4 with pain 5 5  Knee extension 4 5 4+ pain  Ankle dorsiflexion     Ankle plantarflexion 14/25 18/25   Ankle inversion     Ankle eversion      (Blank rows = not tested, score listed is out of 5 possible points.  N = WNL, D = diminished, C = clear for gross weakness with myotome testing, * = concordant pain with testing)     FUNCTIONAL TESTS:  5 times sit to stand: 20.27 sec 6 minute walk test: 1030ft  Berg Balance Scale:  Item Test date: 09-23-23 Test date:  Test date:   Sitting to standing 4. able to stand without using hands and stabilize independently Insert OPRCBERGREEVAL SmartPhrase at re-test date Insert OPRCBERGREEVAL SmartPhrase at re-test date  2. Standing unsupported 4. able to stand safely for 2 minutes    3. Sitting with back unsupported, feet supported 4. able to sit safely and securely for 2 minutes    4. Standing to sitting 4. sits safely with minimal use of hands    5. Pivot transfer  4. able to transfer safely with minor use of hands    6. Standing unsupported with eyes closed 4. able to stand 10 seconds safely    7. Standing unsupported with feet together 3. able to place feet together independently and stand 1 minute with supervision    8. Reaching forward with outstretched arms while standing 4. can reach forward confidently 25 cm (10 inches)    9. Pick up object from the floor from standing 4. able to pick up slipper safely and easily    10. Turning to look behind over left and right shoulders while standing 4. looks behind from both sides and weight shifts well    11. Turn 360 degrees 4. able to turn 360 degrees safely in 4 seconds or less    12. Place alternate foot on step or stool while standing unsupported 4. ble to stand independently and safely and complete 8 steps in 20 seconds    13. Standing unsupported one foot in front 1. needs help to step but can hold 15 seconds    14. Standing on one leg 3. able to lift leg independently and hold 5-10 seconds     Total Score 47/56 Total Score /56 Total Score /56     GAIT: Distance walked: 1088 ft (1830-2205 ft Norm) Assistive device utilized:  None Level of assistance: Complete Independence Comments: Pt with some right knee pain and slow and careful with unsteadiness on feet  TREATMENT DATE:   [facility] Adult PT Treatment:                                                DATE: 10/27/23 Therapeutic Exercise: Rec Bike L2 x 5 min Bridge on ball - 2x10 Knee ext machine - bil - 10# - 2x10  Neuromuscular re-ed: Tandem with Pball ABCs  Therapeutic Activity: Hex bar D/L - 45# -> 75# - 4x5 20# squat chair w/  only light tap - 4x6 - mirror to prevent w/s  [facility] Adult PT Treatment:                                                DATE: 10/15/23 Therapeutic Exercise: Rec Bike L2 x 5 min SLR 4# x 20 each  Hip abduction side lying 4# x 20 each   Neuromuscular re-ed: Tandem stance >30 sec each , added Foam >30 sec each SLS 30 sec each level surface  Therapeutic Activity: Hip hinge with dowel- pt brought is own dowel today  25# squat x 15 15# Dead lift 10 x 2     [facility] Adult PT Treatment:                                                DATE: 10/13/23 Therapeutic Exercise: REC Bike L2 x 6 min Standing GTB hip abduction 10 x 3 each  Heel raises x 20  SLR 4# x 20, with ER x 20  LAQ 4# x 20 right   Therapeutic Activity: 25# squat 10 x 2 - min cues for form Hip hinge with and without dowel, seated and standing -required max cues and will need reinforcement     [facility] Adult PT Treatment:                                                DATE: 10-07-23 Therapeutic Exercise: SLR 3 x 10  with 4 # cuff weight Right only SLR resisted GTB  1 x 10 bil S/L left and right hip abduction 2 x 10 with VC and TC Seated Hamstring Stretch  Seated Knee Extension with GTB  LTR SKTC Standing Heel Raise with Support Manual Therapy: Mc Connel tape medial to lateral pull with pain reduction with exercise at beginning of session. Right knee STW medial quad right  Therapeutic Activity: STS 3 x 10  25# KB Standing hip extension with GTB 3 x 10 3  hold Right  and L    [facility] Adult PT Treatment:                                                DATE: 09-29-23 Therapeutic Exercise: LTR Seated Hamstring Stretch  Seated Knee Extension with Resistance   Standing Heel Raise with Support   Therapeutic Activity: Nu step with UE/LE  6 min level 5 while taking subjective STS 3 x 10  15# KB Standing hip abduction standing hip extension Standing hip extension with RTB Standing hip abduction with RTB Gait training on level surfaces attention to stride length and cadence and arm swing Simulation of bowling ball with 15 3 KB  Self Care: Standing and sitting postures Body mechanics and posture and household chore mechanics    09-23-23  Eval and issue of HEP/ fall prevention  PATIENT EDUCATION:  Education details: POC Explanation of findings, issue HEP and fall prevention Person educated: Patient Education method: Explanation, Demonstration, Tactile cues, Verbal cues, and Handouts Education comprehension: verbalized understanding, returned demonstration, verbal cues required, tactile cues required, and needs further education  HOME EXERCISE PROGRAM: Access Code: [email or web address] URL: [email or web address] Date: 09/23/2023 Prepared by: Graydon Dingwall  Exercises - Supine Lower Trunk Rotation  - 2 x daily - 7 x weekly - 1 sets - 5 reps - 20 hold - Seated Hamstring Stretch  - 2 x daily - 7 x weekly - 1 sets - 2 reps - 20-30 sec hold - Sit to stand with sink support Movement snack  - 3 x daily - 7 x weekly - 1 sets - 10 reps - Seated Knee Extension with Resistance  - 1 x daily - 7 x weekly - 3 sets - 10 reps - Standing Heel Raise with Support  - 1 x daily - 7 x weekly - 3 sets - 10 reps Added 09-29-23 - Supine Bridge  - 1 x daily - 7 x weekly - 3 sets - 10 reps - Hip Extension with Resistance Loop  - 1 x daily - 7 x  weekly - 3 sets - 10 reps - Standing Hip Abduction with Resistance at Ankles and Counter Support  - 1 x daily - 7 x weekly - 3 sets - 10 reps Added 10-07-23 - Supine Straight-Leg Raise With Resistance at Ankles  - 1 x daily - 7 x weekly - 3 sets - 10 reps - Sidelying Hip Abduction  - 1 x daily - 7 x weekly - 2 sets - 10 reps Added 10/13/23 Standing and seated hip hinge practice with dowel  ASSESSMENT:  CLINICAL IMPRESSION:  10/27/2023:  Merilee tolerated session well with no adverse reaction.  Progressing well with strength and balance.  Able to progress to trap bar D/L with 75# and knee ext with pt noting fatigue but no increase in pain.  Pt reports interest in gym membership.  May use remaining visits to establish gym HEP and progress as tolerated.  EVAL- Patient is a 63 y.o. male who was seen today for physical therapy evaluation and treatment for generalized weakness following DKA episode with hospitalization.  Pt also with Right acute medial knee pain and some bil numbness bil since hospitalization. Pt with balance deficits of BERG 47/56, and functional weakness noted on PF heel raise. Pt with unsteadiness and fatigue and will benefit from skilled PT to address impairment and return to PLOF  with safety and strength to care for autistic dtr weighing 50 #.   OBJECTIVE IMPAIRMENTS: decreased activity tolerance, decreased balance, decreased endurance, decreased knowledge of condition, difficulty walking, decreased ROM, decreased strength, impaired sensation, improper body mechanics, and pain.   ACTIVITY LIMITATIONS: carrying, lifting, standing, squatting, stairs, transfers, locomotion level, and caring for others  PARTICIPATION LIMITATIONS: cleaning, shopping, community activity, yard work, and caring for autistic dtr  PERSONAL FACTORS: MI (in high school), COPD. DM, Sleep apnea, Asthma are also affecting patient's functional outcome.   REHAB POTENTIAL: Good  CLINICAL DECISION MAKING:  Evolving/moderate complexity  EVALUATION COMPLEXITY: Moderate   GOALS: Goals reviewed with patient? Yes  SHORT TERM GOALS: Target date: 10-14-23  Pt will be I with initial HEP Baseline: no knowledge Goal status: MET   2.   Pt will perform 5xSTS in <16 sec in order to demonstrate reduced fall risk and improved functional independence. (MCID of 2.3sec) Baseline: 20.27 sec 10/15/23: 8.2 sec  Goal status: MET   3.  Pt will be educated on fall preparedness Baseline: no knowledge Goal status: MET    LONG TERM GOALS: Target date: 11-04-23  Pt will be I with advanced HEP Baseline: no knowledge Goal status: INITIAL  2.  Pt will show improved ability to walk for exercise by improving within norms for age Baseline: 1088 ft Norm 1830- 2205 ft Goal status: INITIAL  3.  Pt will be able to shop at [facility] for 30 minutes without fatiguing Baseline: unable to shop due to weakness and fatigue Goal status: INITIAL  4.  Pt will demonstrate ability to dead lift 50 # to simulate caring for 50 lb autistic daughter Baseline: unable to lift presently without unsteadiness Goal status: INITIAL  5.  Pt will be able to improve balance in order to mow lawn on uneven ground with no loss of balance Baseline: unable to mow lawn at present due to weakness and unsteadiness Goal status: INITIAL  6.  PSFS score with increase to at least 2 points for average score for MDIC.  revised Baseline: 18/3  (6) Goal status: INITIAL  PLAN:  PT FREQUENCY: 1-2x/week  PT DURATION: 6 weeks  PLANNED INTERVENTIONS: 97164- PT Re-evaluation, 97750- Physical Performance Testing, 97110-Therapeutic exercises, 97530- Therapeutic activity, [id number]- Neuromuscular re-education, 97535- Self Care, [id number]- Manual therapy, [id number]- Gait training, [phone]- Electrical stimulation (manual), [phone] (1-2 muscles), 20561 (3+ muscles)- Dry Needling, Patient/Family education, Balance training, Stair training, Taping, Joint mobilization,  Spinal mobilization, Cryotherapy, and Moist heat.  PLAN FOR NEXT SESSION:  CKC exercises. Progress balance exercises , perhaps DGI  Helene FORBES [facility] PT 10/27/23 10:15 AM Phone: [phone] Fax: [phone]   For all possible CPT codes, reference the Planned Interventions line above.     Check all conditions that are expected to impact treatment: {Conditions expected to impact treatment:Diabetes mellitus and Musculoskeletal disorders

## 2023-10-29 ENCOUNTER — Encounter: Payer: Self-pay | Admitting: Physical Therapy

## 2023-10-29 ENCOUNTER — Ambulatory Visit: Payer: Self-pay | Admitting: Physical Therapy

## 2023-10-29 DIAGNOSIS — R2681 Unsteadiness on feet: Secondary | ICD-10-CM

## 2023-10-29 DIAGNOSIS — M25561 Pain in right knee: Secondary | ICD-10-CM

## 2023-10-29 DIAGNOSIS — R2689 Other abnormalities of gait and mobility: Secondary | ICD-10-CM

## 2023-10-29 DIAGNOSIS — M6281 Muscle weakness (generalized): Secondary | ICD-10-CM

## 2023-10-29 NOTE — Therapy (Signed)
 OUTPATIENT PHYSICAL THERAPY THORACOLUMBAR TREATMENT   Patient Name: Jack Cunningham MRN: [id number] DOB:[birth year], 63 y.o., male Today's Date: 10/29/2023  END OF SESSION:  PT End of Session - 10/29/23 1022     Visit Number 8    Number of Visits 13    Date for PT Re-Evaluation 11/04/23    Authorization Type [place] [employer]  BERG/ PSFS    PT Start Time 1017    PT Stop Time 1100    PT Time Calculation (min) 43 min    Behavior During Therapy WFL for tasks assessed/performed             Past Medical History:  Diagnosis Date   Asthma    COPD (chronic obstructive pulmonary disease) (HCC)    Diabetes mellitus without complication (HCC)    Myocardial infarction (HCC)    1981 at age 35   Pre-diabetes    Sleep apnea    No CPAP   Past Surgical History:  Procedure Laterality Date   no surgeries as of 07/21/19     TOOTH EXTRACTION N/A 08/30/2020   Procedure: DENTAL RESTORATION/EXTRACTIONS;  Surgeon: Sheryle Hamilton, DMD;  Location: [facility] OR;  Service: Oral Surgery;  Laterality: N/A;   Patient Active Problem List   Diagnosis Date Noted   DKA (diabetic ketoacidosis) (HCC) 09/02/2023   COPD (chronic obstructive pulmonary disease) (HCC) 06/24/2017   COPD exacerbation (HCC) 06/23/2017    PCP: Kahoano, [name], MD   REFERRING PROVIDER: Dennise Lavada POUR, MD   REFERRING DIAG: R53.1 (ICD-10-CM) - Weakness   Rationale for Evaluation and Treatment: Rehabilitation  THERAPY DIAG:  Muscle weakness (generalized)  Unsteadiness on feet  Acute pain of right knee  Other abnormalities of gait and mobility  ONSET DATE: September 02, 2023  DKA  SUBJECTIVE:                                                                                                                                                                                           SUBJECTIVE STATEMENT:  10/29/2023:  I must have slept on my leg funny, my knee hurts today 6/10.   EVAL- I am here because of my Diabetes and  weakness. I have poor vision and I have left knee numbness and I have weakness in both my legs. I am feeling a bit off in my balance and I have overall weakness and I would like to get stronger. I was in the hosptal September 02, 2023 with Diabetic Ketoacidosis for 7 days.  I feel weak and not able to shop at [employer] like I used to.  I have a dtr with autism  and I need to help and care for her and she is about 50lb and I cannot do it right now. I am afraid that Iwill fall. I have fallen twice when I ' blacked  out, but I think my DM medication is better now.  PERTINENT HISTORY:  MI (in high school), COPD. DM, Sleep apnea, Asthma  PAIN:  Are you having pain? Yes: NPRS scale: at rest 5/10 at worst at night 8/10 Pain location: right knee pain Pain description: aching  Aggravating factors: lying in bed at night, stand longer than 15 minutes.  Relieving factors: resting  PRECAUTIONS: Other: Novocain  RED FLAGS: None   WEIGHT BEARING RESTRICTIONS: No  FALLS:  Has patient fallen in last 6 months? Yes. 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  DIAGNOSTIC FINDINGS:  See medical chart  PATIENT SURVEYS:  PSFS: THE PATIENT SPECIFIC FUNCTIONAL SCALE  Place score of 0-10 (0 = unable to perform activity and 10 = able to perform activity at the same level as before injury or problem)  Activity Date: 09-23-23    Shopping in grocery store for 30 minutes without fatigue        7    2. Mowing the [place]        6    3.Pick up 50 lb autistic dtr       5    4.      Total Score 18/3      Total Score = Sum of activity scores/number of activities  Minimally Detectable Change: 3 points (for single activity); 2 points (for average score)  [name] (nd). The Patient Specific Functional Scale . Retrieved from [email or web address]    COGNITION: Overall cognitive status: Within functional limits for tasks assessed     SENSATION: Pt reports numbness over left thigh to knee and and also over right thigh to knee. Pt reports no numbess in bil feet  MUSCLE LENGTH: Hamstrings: bil tightness Thomas test: Right 15 deg; Left 10 deg  POSTURE: rounded shoulders, forward head, and decreased lumbar lordosis  PALPATION: TTP over medial knee  LUMBAR ROM:   AROM eval  Flexion Fingertips to lower shin  Extension WNL  Right lateral flexion WNL  Left lateral flexion WNL  Right rotation WNL  Left rotation WNL  Key: WFL = within functional limits not formally assessed, * = concordant pain, s = stiffness/stretching sensation, NT = not tested)    LOWER EXTREMITY ROM:     Active  Right eval Left eval  Hip flexion    Hip extension    Hip abduction    Hip adduction    Hip internal rotation    Hip external rotation    Knee flexion 124  130  Knee extension 0 0  Ankle dorsiflexion    Ankle plantarflexion    Ankle inversion    Ankle eversion    Key: WFL = within functional limits not formally assessed, * = concordant  pain, s = stiffness/stretching sensation, NT = not tested)     LOWER EXTREMITY MMT:    MMT Right eval Left eval Right 10/13/23  Hip flexion 4+ 4+   Hip extension 4 4   Hip abduction 4 4   Hip adduction     Hip internal rotation     Hip external rotation     Knee flexion 4 with pain 5 5  Knee extension 4 5 4+ pain  Ankle dorsiflexion     Ankle plantarflexion 14/25 18/25    Ankle inversion     Ankle eversion     (Blank rows = not tested, score listed is out of 5 possible points.  N = WNL, D = diminished, C = clear for gross weakness with myotome testing, * = concordant pain with testing)     FUNCTIONAL TESTS:  5 times sit to stand: 20.27 sec 6 minute walk test: 1043ft  Berg Balance Scale:  Item Test date: 09-23-23 Test date:  Test date:   Sitting to standing 4. able to stand without using hands and stabilize independently Insert OPRCBERGREEVAL SmartPhrase at re-test date Insert OPRCBERGREEVAL SmartPhrase at re-test date  2. Standing unsupported 4. able to stand safely for 2 minutes    3. Sitting with back unsupported, feet supported 4. able to sit safely and securely for 2 minutes    4. Standing to sitting 4. sits safely with minimal use of hands    5. Pivot transfer  4. able to transfer safely with minor use of hands    6. Standing unsupported with eyes closed 4. able to stand 10 seconds safely    7. Standing unsupported with feet together 3. able to place feet together independently and stand 1 minute with supervision    8. Reaching forward with outstretched arms while standing 4. can reach forward confidently 25 cm (10 inches)    9. Pick up object from the floor from standing 4. able to pick up slipper safely and easily    10. Turning to look behind over left and right shoulders while standing 4. looks behind from both sides and weight shifts well    11. Turn 360 degrees 4. able to turn 360 degrees safely in 4 seconds or less    12. Place alternate foot on step or stool while standing unsupported 4. ble to stand independently and safely and complete 8 steps in 20 seconds    13. Standing unsupported one foot in front 1. needs help to step but can hold 15 seconds    14. Standing on one leg 3. able to lift leg independently and hold 5-10 seconds     Total Score 47/56 Total Score /56 Total Score /56     GAIT: Distance walked: 1088 ft (1830-2205 ft  Norm) Assistive device utilized: None Level of assistance: Complete Independence Comments: Pt with some right knee pain and slow and careful with unsteadiness on feet  TREATMENT DATE:    [facility] Adult PT Treatment:                                                DATE: 10/29/23 Therapeutic Exercise: NuStep L6 UE and LE  SLR Rt LE x 15 hip ER 2nd set  Bridge x 15  Figure 4 bridge  Leg press 2 plates x 15 , 5 plates  x  10  Knee extension 25 lbs x  15, single leg x 10 each  Hamstring curl 35 lbs x 15  Calf raise x 10 double and single leg   [facility] Adult PT Treatment:                                                DATE: 10/27/23 Therapeutic Exercise: Rec Bike L2 x 5 min Bridge on ball - 2x10 Knee ext machine - bil - 10# - 2x10  Neuromuscular re-ed: Tandem with Pball ABCs  Therapeutic Activity: Hex bar D/L - 45# -> 75# - 4x5 20# squat chair w/ only light tap - 4x6 - mirror to prevent w/s  [facility] Adult PT Treatment:                                                DATE: 10/15/23 Therapeutic Exercise: Rec Bike L2 x 5 min SLR 4# x 20 each  Hip abduction side lying 4# x 20 each   Neuromuscular re-ed: Tandem stance >30 sec each , added Foam >30 sec each SLS 30 sec each level surface  Therapeutic Activity: Hip hinge with dowel- pt brought is own dowel today  25# squat x 15 15# Dead lift 10 x 2     [facility] Adult PT Treatment:                                                DATE: 10/13/23 Therapeutic Exercise: REC Bike L2 x 6 min Standing GTB hip abduction 10 x 3 each  Heel raises x 20  SLR 4# x 20, with ER x 20  LAQ 4# x 20 right   Therapeutic Activity: 25# squat 10 x 2 - min cues for form Hip hinge with and without dowel, seated and standing -required max cues and will need reinforcement     [facility] Adult PT Treatment:                                                DATE: 10-07-23 Therapeutic Exercise: SLR 3 x 10  with 4 # cuff weight Right only SLR resisted GTB  1 x 10 bil S/L left and  right hip abduction 2 x 10 with VC and TC Seated Hamstring Stretch  Seated Knee Extension with GTB  LTR SKTC Standing Heel Raise with Support Manual Therapy: Mc Connel tape medial to lateral pull with pain reduction with exercise at beginning of session. Right knee STW medial quad right  Therapeutic Activity: STS 3 x 10  25# KB Standing hip extension with GTB 3 x 10 3  hold Right and L    [facility] Adult PT Treatment:                                                DATE: 09-29-23 Therapeutic Exercise:  LTR Seated Hamstring Stretch  Seated Knee Extension with Resistance   Standing Heel Raise with Support   Therapeutic Activity: Nu step with UE/LE  6 min level 5 while taking subjective STS 3 x 10  15# KB Standing hip abduction standing hip extension Standing hip extension with RTB Standing hip abduction with RTB Gait training on level surfaces attention to stride length and cadence and arm swing Simulation of bowling ball with 15 3 KB  Self Care: Standing and sitting postures Body mechanics and posture and household chore mechanics    09-23-23  Eval and issue of HEP/ fall prevention                                                                                                                                PATIENT EDUCATION:  Education details: POC Explanation of findings, issue HEP and fall prevention Person educated: Patient Education method: Explanation, Demonstration, Tactile cues, Verbal cues, and Handouts Education comprehension: verbalized understanding, returned demonstration, verbal cues required, tactile cues required, and needs further education  HOME EXERCISE PROGRAM: Access Code: [email or web address] URL: [email or web address] Date: 09/23/2023 Prepared by: Graydon Dingwall  Exercises - Supine Lower Trunk Rotation  - 2 x daily - 7 x weekly - 1 sets - 5 reps - 20 hold - Seated Hamstring Stretch  - 2 x daily - 7 x weekly - 1 sets - 2 reps - 20-30 sec hold -  Sit to stand with sink support Movement snack  - 3 x daily - 7 x weekly - 1 sets - 10 reps - Seated Knee Extension with Resistance  - 1 x daily - 7 x weekly - 3 sets - 10 reps - Standing Heel Raise with Support  - 1 x daily - 7 x weekly - 3 sets - 10 reps Added 09-29-23 - Supine Bridge  - 1 x daily - 7 x weekly - 3 sets - 10 reps - Hip Extension with Resistance Loop  - 1 x daily - 7 x weekly - 3 sets - 10 reps - Standing Hip Abduction with Resistance at Ankles and Counter Support  - 1 x daily - 7 x weekly - 3 sets - 10 reps Added 10-07-23 - Supine Straight-Leg Raise With Resistance at Ankles  - 1 x daily - 7 x weekly - 3 sets - 10 reps - Sidelying Hip Abduction  - 1 x daily - 7 x weekly - 2 sets - 10 reps Added 10/13/23 Standing and seated hip hinge practice with dowel     ASSESSMENT:  CLINICAL IMPRESSION:  10/29/2023:  Tripp   is working hard at home and in sessions on strength and transitioning to the gym for continued fitness.  He reports no knee pain after the session.  May use remaining visits to establish gym HEP and progress as tolerated.  EVAL- Patient is a 63 y.o. male who was seen today for physical therapy evaluation and treatment for generalized  weakness following DKA episode with hospitalization.  Pt also with Right acute medial knee pain and some bil numbness bil since hospitalization. Pt with balance deficits of BERG 47/56, and functional weakness noted on PF heel raise. Pt with unsteadiness and fatigue and will benefit from skilled PT to address impairment and return to PLOF  with safety and strength to care for autistic dtr weighing 50 #.   OBJECTIVE IMPAIRMENTS: decreased activity tolerance, decreased balance, decreased endurance, decreased knowledge of condition, difficulty walking, decreased ROM, decreased strength, impaired sensation, improper body mechanics, and pain.   ACTIVITY LIMITATIONS: carrying, lifting, standing, squatting, stairs, transfers, locomotion level, and  caring for others  PARTICIPATION LIMITATIONS: cleaning, shopping, community activity, yard work, and caring for autistic dtr  PERSONAL FACTORS: MI (in high school), COPD. DM, Sleep apnea, Asthma are also affecting patient's functional outcome.   REHAB POTENTIAL: Good  CLINICAL DECISION MAKING: Evolving/moderate complexity  EVALUATION COMPLEXITY: Moderate   GOALS: Goals reviewed with patient? Yes  SHORT TERM GOALS: Target date: 10-14-23  Pt will be I with initial HEP Baseline: no knowledge Goal status: MET   2.   Pt will perform 5xSTS in <16 sec in order to demonstrate reduced fall risk and improved functional independence. (MCID of 2.3sec) Baseline: 20.27 sec 10/15/23: 8.2 sec  Goal status: MET   3.  Pt will be educated on fall preparedness Baseline: no knowledge Goal status: MET    LONG TERM GOALS: Target date: 11-04-23  Pt will be I with advanced HEP Baseline: no knowledge Goal status: INITIAL  2.  Pt will show improved ability to walk for exercise by improving within norms for age Baseline: 1088 ft Norm 1830- 2205 ft Goal status: INITIAL  3.  Pt will be able to shop at [facility] for 30 minutes without fatiguing Baseline: unable to shop due to weakness and fatigue Goal status: INITIAL  4.  Pt will demonstrate ability to dead lift 50 # to simulate caring for 50 lb autistic daughter Baseline: unable to lift presently without unsteadiness Goal status: INITIAL  5.  Pt will be able to improve balance in order to mow lawn on uneven ground with no loss of balance Baseline: unable to mow lawn at present due to weakness and unsteadiness Goal status: INITIAL  6.  PSFS score with increase to at least 2 points for average score for MDIC.  revised Baseline: 18/3  (6) Goal status: INITIAL  PLAN:  PT FREQUENCY: 1-2x/week  PT DURATION: 6 weeks  PLANNED INTERVENTIONS: 97164- PT Re-evaluation, 97750- Physical Performance Testing, 97110-Therapeutic exercises, 97530-  Therapeutic activity, [id number]- Neuromuscular re-education, 97535- Self Care, [id number]- Manual therapy, [id number]- Gait training, [phone]- Electrical stimulation (manual), [phone] (1-2 muscles), 20561 (3+ muscles)- Dry Needling, Patient/Family education, Balance training, Stair training, Taping, Joint mobilization, Spinal mobilization, Cryotherapy, and Moist heat.  PLAN FOR NEXT SESSION:  CKC exercises. Progress balance exercises , perhaps DGI  Delon LITTIE Norma PT 10/29/23 10:23 AM Phone: [phone] Fax: [phone]   For all possible CPT codes, reference the Planned Interventions line above.     Check all conditions that are expected to impact treatment: {Conditions expected to impact treatment:Diabetes mellitus and Musculoskeletal disorders

## 2023-11-03 ENCOUNTER — Ambulatory Visit: Payer: Self-pay | Attending: Internal Medicine | Admitting: Physical Therapy

## 2023-11-03 DIAGNOSIS — M25561 Pain in right knee: Secondary | ICD-10-CM | POA: Insufficient documentation

## 2023-11-03 DIAGNOSIS — M6281 Muscle weakness (generalized): Secondary | ICD-10-CM | POA: Diagnosis present

## 2023-11-03 DIAGNOSIS — R2681 Unsteadiness on feet: Secondary | ICD-10-CM | POA: Insufficient documentation

## 2023-11-03 DIAGNOSIS — R2689 Other abnormalities of gait and mobility: Secondary | ICD-10-CM | POA: Insufficient documentation

## 2023-11-03 NOTE — Therapy (Signed)
 OUTPATIENT PHYSICAL THERAPY THORACOLUMBAR TREATMENT   Patient Name: Jack Cunningham MRN: [id number] DOB:02/24/1961, 63 y.o., male Today's Date: 11/03/2023  END OF SESSION:  PT End of Session - 11/03/23 0916     Visit Number 9    Number of Visits 13    Date for PT Re-Evaluation 11/04/23    Authorization Type [facility]  BERG/ PSFS    PT Start Time 0920    PT Stop Time 1005    PT Time Calculation (min) 45 min             Past Medical History:  Diagnosis Date   Asthma    COPD (chronic obstructive pulmonary disease) (HCC)    Diabetes mellitus without complication (HCC)    Myocardial infarction (HCC)    1981 at age 73   Pre-diabetes    Sleep apnea    No CPAP   Past Surgical History:  Procedure Laterality Date   no surgeries as of 07/21/19     TOOTH EXTRACTION N/A 08/30/2020   Procedure: DENTAL RESTORATION/EXTRACTIONS;  Surgeon: Sheryle Hamilton, DMD;  Location: [facility] OR;  Service: Oral Surgery;  Laterality: N/A;   Patient Active Problem List   Diagnosis Date Noted   DKA (diabetic ketoacidosis) (HCC) 09/02/2023   COPD (chronic obstructive pulmonary disease) (HCC) 06/24/2017   COPD exacerbation (HCC) 06/23/2017    PCP: Kahoano, [name], MD   REFERRING PROVIDER: Dennise Lavada POUR, MD   REFERRING DIAG: R53.1 (ICD-10-CM) - Weakness   Rationale for Evaluation and Treatment: Rehabilitation  THERAPY DIAG:  Muscle weakness (generalized)  Unsteadiness on feet  ONSET DATE: September 02, 2023  DKA  SUBJECTIVE:                                                                                                                                                                                           SUBJECTIVE STATEMENT:  11/03/2023:  My daughter kicked me in the knee yesterday. 2/10. Has been overall much better.   EVAL- I am here because of my Diabetes and weakness. I have poor vision and I have left knee numbness and I have weakness in both my legs. I am feeling a bit  off in my balance and I have overall weakness and I would like to get stronger. I was in the hosptal September 02, 2023 with Diabetic Ketoacidosis for 7 days.  I feel weak and not able to shop at [employer] like I used to.  I have a dtr with autism and I need to help and care for her and she is about 50lb and I cannot do it right now. I  am afraid that Iwill fall. I have fallen twice when I ' blacked  out, but I think my DM medication is better now.  PERTINENT HISTORY:  MI (in high school), COPD. DM, Sleep apnea, Asthma  PAIN:  Are you having pain? Yes: NPRS scale: at rest 0-2/10  Pain location: right knee pain Pain description: aching  Aggravating factors: lying in bed at night, stand longer than 15 minutes.  Relieving factors: resting  PRECAUTIONS: Other: Novocain  RED FLAGS: None   WEIGHT BEARING RESTRICTIONS: No  FALLS:  Has patient fallen in last 6 months? Yes. 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  DIAGNOSTIC FINDINGS:  See medical chart  PATIENT SURVEYS:  PSFS: THE PATIENT SPECIFIC FUNCTIONAL SCALE  Place score of 0-10 (0 = unable to perform activity and 10 = able to perform activity at the same level as before injury or problem)  Activity Date: 09-23-23 Date 11/03/23   Shopping in grocery store for 30 minutes without fatigue       7 10   2. Mowing the Lawn        6 10   3.Pick up 50 lb autistic dtr       5 9   4.      Total Score 18/3 29/3      Total Score = Sum of activity scores/number of activities  Minimally Detectable Change: 3 points (for single activity); 2 points (for average score)  [name] (nd). The Patient Specific Functional Scale . Retrieved from [email or web address]    COGNITION: Overall cognitive status: Within functional limits for tasks assessed     SENSATION: Pt reports numbness over left thigh to knee and and also over right thigh to knee. Pt reports no numbess in bil feet  MUSCLE LENGTH: Hamstrings: bil tightness Thomas test: Right 15 deg; Left 10 deg  POSTURE: rounded shoulders, forward head, and decreased lumbar lordosis  PALPATION: TTP over medial knee  LUMBAR ROM:   AROM eval  Flexion Fingertips to lower shin  Extension WNL  Right lateral flexion WNL  Left lateral flexion WNL  Right rotation WNL  Left rotation WNL  Key: WFL = within functional limits not formally assessed, * = concordant pain, s = stiffness/stretching sensation, NT = not tested)    LOWER EXTREMITY ROM:     Active  Right eval Left eval  Hip flexion    Hip extension    Hip abduction    Hip adduction    Hip internal rotation    Hip external rotation    Knee flexion 124  130  Knee extension 0 0  Ankle dorsiflexion    Ankle plantarflexion    Ankle inversion    Ankle eversion    Key: WFL = within functional limits not formally assessed, * = concordant pain, s = stiffness/stretching sensation, NT = not tested)     LOWER EXTREMITY MMT:    MMT Right eval Left eval Right  10/13/23  Hip flexion 4+ 4+   Hip extension 4 4   Hip abduction 4 4   Hip adduction     Hip internal rotation     Hip external rotation     Knee flexion 4 with pain 5 5  Knee extension 4 5 4+ pain  Ankle dorsiflexion     Ankle plantarflexion 14/25 18/25   Ankle inversion     Ankle eversion     (Blank rows = not tested, score listed is out of 5 possible points.  N  = WNL, D = diminished, C = clear for gross weakness with myotome testing, * = concordant pain with testing)     FUNCTIONAL TESTS:  5 times sit to stand: 20.27 sec 6 minute walk test: 1081ft  Berg Balance Scale:  Item Test date: 09-23-23 Test date:  Test date:   Sitting to standing 4. able to stand without using hands and stabilize independently Insert OPRCBERGREEVAL SmartPhrase at re-test date Insert OPRCBERGREEVAL SmartPhrase at re-test date  2. Standing unsupported 4. able to stand safely for 2 minutes    3. Sitting with back unsupported, feet supported 4. able to sit safely and securely for 2 minutes    4. Standing to sitting 4. sits safely with minimal use of hands    5. Pivot transfer  4. able to transfer safely with minor use of hands    6. Standing unsupported with eyes closed 4. able to stand 10 seconds safely    7. Standing unsupported with feet together 3. able to place feet together independently and stand 1 minute with supervision    8. Reaching forward with outstretched arms while standing 4. can reach forward confidently 25 cm (10 inches)    9. Pick up object from the floor from standing 4. able to pick up slipper safely and easily    10. Turning to look behind over left and right shoulders while standing 4. looks behind from both sides and weight shifts well    11. Turn 360 degrees 4. able to turn 360 degrees safely in 4 seconds or less    12. Place alternate foot on step or stool while standing unsupported 4. ble to stand independently and safely and complete 8 steps in 20 seconds    13. Standing unsupported one foot in front 1. needs help to step but can hold 15 seconds    14. Standing on one leg 3. able to lift leg independently and hold 5-10 seconds     Total Score 47/56 Total Score /56 Total Score /56     GAIT: Distance walked: 1088 ft (1830-2205 ft Norm) Assistive device utilized: None Level of assistance: Complete Independence Comments: Pt with some right knee  pain and slow and careful with unsteadiness on feet  TREATMENT DATE:  [facility] Adult PT Treatment:                                                DATE: 11/03/23 Therapeutic Exercise: SLR 2# x 25 each  Hip abdct 2# x 25 each  Bridge x 20  Knee ext 25# bilat, 15 # x 10 SL Knee flex 35#  Tricep press 30#  Seated Row 55#  Lat pull 45#  DL Bar x 12, bar +79# x 12   [facility] Adult PT Treatment:  DATE: 10/29/23 Therapeutic Exercise: NuStep L6 UE and LE  SLR Rt LE x 15 hip ER 2nd set  Bridge x 15  Figure 4 bridge  Leg press 2 plates x 15 , 5 plates  x 10  Knee extension 25 lbs x  15, single leg x 10 each  Hamstring curl 35 lbs x 15  Calf raise x 10 double and single leg   [facility] Adult PT Treatment:                                                DATE: 10/27/23 Therapeutic Exercise: Rec Bike L2 x 5 min Bridge on ball - 2x10 Knee ext machine - bil - 10# - 2x10  Neuromuscular re-ed: Tandem with Pball ABCs  Therapeutic Activity: Hex bar D/L - 45# -> 75# - 4x5 20# squat chair w/ only light tap - 4x6 - mirror to prevent w/s  [facility] Adult PT Treatment:                                                DATE: 10/15/23 Therapeutic Exercise: Rec Bike L2 x 5 min SLR 4# x 20 each  Hip abduction side lying 4# x 20 each   Neuromuscular re-ed: Tandem stance >30 sec each , added Foam >30 sec each SLS 30 sec each level surface  Therapeutic Activity: Hip hinge with dowel- pt brought is own dowel today  25# squat x 15 15# Dead lift 10 x 2     [facility] Adult PT Treatment:                                                DATE: 10/13/23 Therapeutic Exercise: REC Bike L2 x 6 min Standing GTB hip abduction 10 x 3 each  Heel raises x 20  SLR 4# x 20, with ER x 20  LAQ 4# x 20 right   Therapeutic Activity: 25# squat 10 x 2 - min cues for form Hip hinge with and without dowel, seated and standing -required max cues and will need reinforcement     [facility] Adult PT  Treatment:                                                DATE: 10-07-23 Therapeutic Exercise: SLR 3 x 10  with 4 # cuff weight Right only SLR resisted GTB  1 x 10 bil S/L left and right hip abduction 2 x 10 with VC and TC Seated Hamstring Stretch  Seated Knee Extension with GTB  LTR SKTC Standing Heel Raise with Support Manual Therapy: Mc Connel tape medial to lateral pull with pain reduction with exercise at beginning of session. Right knee STW medial quad right  Therapeutic Activity: STS 3 x 10  25# KB Standing hip extension with GTB 3 x 10 3  hold Right and L    [facility] Adult PT Treatment:  DATE: 09-29-23 Therapeutic Exercise: LTR Seated Hamstring Stretch  Seated Knee Extension with Resistance   Standing Heel Raise with Support   Therapeutic Activity: Nu step with UE/LE  6 min level 5 while taking subjective STS 3 x 10  15# KB Standing hip abduction standing hip extension Standing hip extension with RTB Standing hip abduction with RTB Gait training on level surfaces attention to stride length and cadence and arm swing Simulation of bowling ball with 15 3 KB  Self Care: Standing and sitting postures Body mechanics and posture and household chore mechanics    09-23-23  Eval and issue of HEP/ fall prevention                                                                                                                                PATIENT EDUCATION:  Education details: POC Explanation of findings, issue HEP and fall prevention Person educated: Patient Education method: Explanation, Demonstration, Tactile cues, Verbal cues, and Handouts Education comprehension: verbalized understanding, returned demonstration, verbal cues required, tactile cues required, and needs further education  HOME EXERCISE PROGRAM: Access Code: [email or web address] URL: [email or web address] Date: 09/23/2023 Prepared by: Graydon Dingwall  Exercises - Supine Lower Trunk Rotation  - 2 x daily - 7 x weekly - 1 sets - 5 reps - 20 hold - Seated Hamstring Stretch  - 2 x daily - 7 x weekly - 1 sets - 2 reps - 20-30 sec hold - Sit to stand with sink support Movement snack  - 3 x daily - 7 x weekly - 1 sets - 10 reps - Seated Knee Extension with Resistance  - 1 x daily - 7 x weekly - 3 sets - 10 reps - Standing Heel Raise with Support  - 1 x daily - 7 x weekly - 3 sets - 10 reps Added 09-29-23 - Supine Bridge  - 1 x daily - 7 x weekly - 3 sets - 10 reps - Hip Extension with Resistance Loop  - 1 x daily - 7 x weekly - 3 sets - 10 reps - Standing Hip Abduction with Resistance at Ankles and Counter Support  - 1 x daily - 7 x weekly - 3 sets - 10 reps Added 10-07-23 - Supine Straight-Leg Raise With Resistance at Ankles  - 1 x daily - 7 x weekly - 3 sets - 10 reps - Sidelying Hip Abduction  - 1 x daily - 7 x weekly - 2 sets - 10 reps Added 10/13/23 Standing and seated hip hinge practice with dowel     ASSESSMENT:  CLINICAL IMPRESSION:  11/03/2023:  Jack Cunningham   is working hard at home and in sessions on strength and transitioning to the gym for continued fitness. He has met several of his LTGS and reports overall much improved in strength and steadiness.  He reports no knee pain after the session.  Wants to complete his last schedule visit this week and DC to  HEP. Plans to start at gym next week.   EVAL- Patient is a 63 y.o. male who was seen today for physical therapy evaluation and treatment for generalized weakness following DKA episode with hospitalization.  Pt also with Right acute medial knee pain and some bil numbness bil since hospitalization. Pt with balance deficits of BERG 47/56, and functional weakness noted on PF heel raise. Pt with unsteadiness and fatigue and will benefit from skilled PT to address impairment and return to PLOF  with safety and strength to care for autistic dtr weighing 50 #.   OBJECTIVE IMPAIRMENTS:  decreased activity tolerance, decreased balance, decreased endurance, decreased knowledge of condition, difficulty walking, decreased ROM, decreased strength, impaired sensation, improper body mechanics, and pain.   ACTIVITY LIMITATIONS: carrying, lifting, standing, squatting, stairs, transfers, locomotion level, and caring for others  PARTICIPATION LIMITATIONS: cleaning, shopping, community activity, yard work, and caring for autistic dtr  PERSONAL FACTORS: MI (in high school), COPD. DM, Sleep apnea, Asthma are also affecting patient's functional outcome.   REHAB POTENTIAL: Good  CLINICAL DECISION MAKING: Evolving/moderate complexity  EVALUATION COMPLEXITY: Moderate   GOALS: Goals reviewed with patient? Yes  SHORT TERM GOALS: Target date: 10-14-23  Pt will be I with initial HEP Baseline: no knowledge Goal status: MET   2.   Pt will perform 5xSTS in <16 sec in order to demonstrate reduced fall risk and improved functional independence. (MCID of 2.3sec) Baseline: 20.27 sec 10/15/23: 8.2 sec  Goal status: MET   3.  Pt will be educated on fall preparedness Baseline: no knowledge Goal status: MET    LONG TERM GOALS: Target date: 11-04-23  Pt will be I with advanced HEP Baseline: no knowledge Goal status: INITIAL  2.  Pt will show improved ability to walk for exercise by improving within norms for age Baseline: 1088 ft Norm 1830- 2205 ft Goal status: INITIAL  3.  Pt will be able to shop at [facility] for 30 minutes without fatiguing Baseline: unable to shop due to weakness and fatigue 11/03/23: able  Goal status: MET   4.  Pt will demonstrate ability to dead lift 50 # to simulate caring for 50 lb autistic daughter Baseline: unable to lift presently without unsteadiness 11/03/23: feels more steady on feet, wants more upper body strength Goal status: MET   5.  Pt will be able to improve balance in order to mow lawn on uneven ground with no loss of balance Baseline: unable  to mow lawn at present due to weakness and unsteadiness 11/03/23: no issue  Goal status: MET   6.  PSFS score with increase to at least 2 points for average score for MDIC.  revised Baseline: 18/3  (6) 11/03/23: 29/3 Goal status: MET   PLAN:  PT FREQUENCY: 1-2x/week  PT DURATION: 6 weeks  PLANNED INTERVENTIONS: 97164- PT Re-evaluation, 97750- Physical Performance Testing, 97110-Therapeutic exercises, 97530- Therapeutic activity, [id number]- Neuromuscular re-education, 97535- Self Care, [id number]- Manual therapy, [id number]- Gait training, [phone]- Electrical stimulation (manual), [phone] (1-2 muscles), 20561 (3+ muscles)- Dry Needling, Patient/Family education, Balance training, Stair training, Taping, Joint mobilization, Spinal mobilization, Cryotherapy, and Moist heat.  PLAN FOR NEXT SESSION:  [facility] exercises. Progress balance exercises, gym program  Harlene CHRISTELLA Persons PTA 11/03/23 10:02 AM Phone: [phone] Fax: [phone]   For all possible CPT codes, reference the Planned Interventions line above.     Check all conditions that are expected to impact treatment: {Conditions expected to impact treatment:Diabetes mellitus and Musculoskeletal disorders

## 2023-11-05 ENCOUNTER — Ambulatory Visit: Payer: Self-pay | Admitting: Physical Therapy

## 2023-11-05 ENCOUNTER — Encounter: Payer: Self-pay | Admitting: Physical Therapy

## 2023-11-05 DIAGNOSIS — M6281 Muscle weakness (generalized): Secondary | ICD-10-CM | POA: Diagnosis not present

## 2023-11-05 DIAGNOSIS — R2681 Unsteadiness on feet: Secondary | ICD-10-CM

## 2023-11-05 DIAGNOSIS — M25561 Pain in right knee: Secondary | ICD-10-CM

## 2023-11-05 DIAGNOSIS — R2689 Other abnormalities of gait and mobility: Secondary | ICD-10-CM

## 2023-11-05 NOTE — Therapy (Signed)
 PHYSICAL THERAPY DISCHARGE SUMMARY  Visits from Start of Care: 10  Current functional level related to goals / functional outcomes: See assessment/goals   Remaining deficits: See assessment/goals   Education / Equipment: HEP and D/C plans  Patient agrees to discharge. Patient goals were met or partially met. Patient is being discharged due to being pleased with the current functional level.   Patient Name: Jack Cunningham MRN: [id number] DOB:05-28-1960, 63 y.o., male Today's Date: 11/05/2023  END OF SESSION:  PT End of Session - 11/05/23 0838     Visit Number 10    Number of Visits 13    Date for PT Re-Evaluation 11/04/23    Authorization Type [facility] [employer]  BERG/ PSFS    PT Start Time 0845    PT Stop Time 0926    PT Time Calculation (min) 41 min             Past Medical History:  Diagnosis Date   Asthma    COPD (chronic obstructive pulmonary disease) (HCC)    Diabetes mellitus without complication (HCC)    Myocardial infarction (HCC)    1981 at age 63   Pre-diabetes    Sleep apnea    No CPAP   Past Surgical History:  Procedure Laterality Date   no surgeries as of 07/21/19     TOOTH EXTRACTION N/A 08/30/2020   Procedure: DENTAL RESTORATION/EXTRACTIONS;  Surgeon: Sheryle Hamilton, DMD;  Location: [facility] OR;  Service: Oral Surgery;  Laterality: N/A;   Patient Active Problem List   Diagnosis Date Noted   DKA (diabetic ketoacidosis) (HCC) 09/02/2023   COPD (chronic obstructive pulmonary disease) (HCC) 06/24/2017   COPD exacerbation (HCC) 06/23/2017    PCP: Kahoano, [name], MD   REFERRING PROVIDER: Dennise Lavada POUR, MD   REFERRING DIAG: R53.1 (ICD-10-CM) - Weakness   Rationale for Evaluation and Treatment: Rehabilitation  THERAPY DIAG:  Muscle weakness (generalized) - Plan: PT plan of care cert/re-cert  Unsteadiness on feet - Plan: PT plan of care cert/re-cert  Acute pain of right knee - Plan: PT plan of care cert/re-cert  Other abnormalities  of gait and mobility - Plan: PT plan of care cert/re-cert  ONSET DATE: September 02, 2023  DKA  SUBJECTIVE:                                                                                                                                                                                           SUBJECTIVE STATEMENT:  11/05/2023:  Pt reports that he is doing much better overall.  He feels confident continuing strengthening with HEP.   EVAL- I am here because of  my Diabetes and weakness. I have poor vision and I have left knee numbness and I have weakness in both my legs. I am feeling a bit off in my balance and I have overall weakness and I would like to get stronger. I was in the hosptal September 02, 2023 with Diabetic Ketoacidosis for 7 days.  I feel weak and not able to shop at [employer] like I used to.  I have a dtr with autism and I need to help and care for her and she is about 50lb and I cannot do it right now. I am afraid that Iwill fall. I have fallen twice when I ' blacked  out, but I think my DM medication is better now.  PERTINENT HISTORY:  MI (in high school), COPD. DM, Sleep apnea, Asthma  PAIN:  Are you having pain? Yes: NPRS scale: at rest 0-2/10  Pain location: right knee pain Pain description: aching  Aggravating factors: lying in bed at night, stand longer than 15 minutes.  Relieving factors: resting  PRECAUTIONS: Other: Novocain  RED FLAGS: None   WEIGHT BEARING RESTRICTIONS: No  FALLS:  Has patient fallen in last 6 months? Yes. 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  DIAGNOSTIC FINDINGS:  See medical chart  PATIENT SURVEYS:  PSFS: THE PATIENT SPECIFIC FUNCTIONAL SCALE  Place score of 0-10 (0 = unable to perform activity and 10 = able to perform activity at the same level as before injury or problem)  Activity Date: 09-23-23 Date 11/03/23   Shopping in grocery store for 30 minutes without fatigue       7 10   2. Mowing the Lawn        6 10   3.Pick up 50 lb autistic dtr       5 9   4.      Total Score 18/3 29/3     Total Score = Sum of activity scores/number of activities  Minimally Detectable Change: 3 points (for single activity); 2 points (for average score)  [name] (nd). The Patient Specific Functional Scale . Retrieved from [email or web address]    COGNITION: Overall cognitive status: Within functional limits for tasks assessed     SENSATION: Pt reports numbness over left thigh to knee and and also over right thigh to knee. Pt reports no numbess in bil feet  MUSCLE LENGTH: Hamstrings: bil tightness Thomas test: Right 15 deg; Left 10 deg  POSTURE: rounded shoulders, forward head, and decreased lumbar lordosis  PALPATION: TTP over medial knee  LUMBAR ROM:   AROM eval  Flexion Fingertips to lower shin  Extension WNL  Right lateral flexion WNL  Left lateral flexion WNL  Right rotation WNL  Left rotation WNL  Key: WFL = within functional limits not formally assessed, * = concordant pain, s = stiffness/stretching sensation, NT = not tested)    LOWER EXTREMITY ROM:     Active  Right eval Left  eval  Hip flexion    Hip extension    Hip abduction    Hip adduction    Hip internal rotation    Hip external rotation    Knee flexion 124  130  Knee extension 0 0  Ankle dorsiflexion    Ankle plantarflexion    Ankle inversion    Ankle  eversion    Key: WFL = within functional limits not formally assessed, * = concordant pain, s = stiffness/stretching sensation, NT = not tested)     LOWER EXTREMITY MMT:    MMT Right eval Left eval Right 10/13/23  Hip flexion 4+ 4+   Hip extension 4 4   Hip abduction 4 4   Hip adduction     Hip internal rotation     Hip external rotation     Knee flexion 4 with pain 5 5  Knee extension 4 5 4+ pain  Ankle dorsiflexion     Ankle plantarflexion 14/25 18/25   Ankle inversion     Ankle eversion     (Blank rows = not tested, score listed is out of 5 possible points.  N = WNL, D = diminished, C = clear for gross weakness with myotome testing, * = concordant pain with testing)     FUNCTIONAL TESTS:  5 times sit to stand: 20.27 sec 6 minute walk test: 1023ft  Berg Balance Scale:  Item Test date: 09-23-23 Test date:  Test date:   Sitting to standing 4. able to stand without using hands and stabilize independently Insert OPRCBERGREEVAL SmartPhrase at re-test date Insert OPRCBERGREEVAL SmartPhrase at re-test date  2. Standing unsupported 4. able to stand safely for 2 minutes    3. Sitting with back unsupported, feet supported 4. able to sit safely and securely for 2 minutes    4. Standing to sitting 4. sits safely with minimal use of hands    5. Pivot transfer  4. able to transfer safely with minor use of hands    6. Standing unsupported with eyes closed 4. able to stand 10 seconds safely    7. Standing unsupported with feet together 3. able to place feet together independently and stand 1 minute with supervision    8. Reaching forward with outstretched arms while standing 4. can reach forward confidently 25 cm (10 inches)    9. Pick up object from the floor from standing 4. able to pick up slipper safely and easily    10. Turning to look behind over left and right shoulders while standing 4. looks behind from both sides and weight shifts well    11. Turn 360 degrees 4. able to turn 360  degrees safely in 4 seconds or less    12. Place alternate foot on step or stool while standing unsupported 4. ble to stand independently and safely and complete 8 steps in 20 seconds    13. Standing unsupported one foot in front 1. needs help to step but can hold 15 seconds    14. Standing on one leg 3. able to lift leg independently and hold 5-10 seconds     Total Score 47/56 Total Score /56 Total Score /56     GAIT: Distance walked: 1088 ft (1830-2205 ft Norm) Assistive device utilized: None Level of assistance: Complete Independence Comments: Pt with some right knee pain and slow and careful with unsteadiness on feet  TREATMENT DATE:   [facility] Adult PT Treatment:  DATE: 11/05/23  Therapeutic Exercise: Bridge on ball - 2x10 Updating HEP   Therapeutic Activity: Hex bar - 75# - 4x6 4'' step up  6'' step up - fwd and lat collecting information for goals, checking progress, and reviewing with patient   6 MWT                                                                                                                    PATIENT EDUCATION:  Education details: POC Explanation of findings, issue HEP and fall prevention Person educated: Patient Education method: Explanation, Demonstration, Tactile cues, Verbal cues, and Handouts Education comprehension: verbalized understanding, returned demonstration, verbal cues required, tactile cues required, and needs further education  HOME EXERCISE PROGRAM: Access Code: [email or web address] URL: [email or web address] Date: 11/05/2023 Prepared by: Helene Gasmen  Exercises - Supine Lower Trunk Rotation  - 2 x daily - 7 x weekly - 1 sets - 5 reps - 20 hold - Seated Hamstring Stretch  - 2 x daily - 7 x weekly - 1 sets - 2 reps - 20-30 sec hold - Single Leg Bridge  - 1 x daily - 7 x weekly - 3 sets - 10 reps - Seated Knee Extension with Resistance  - 1 x daily - 7 x weekly - 3 sets - 10  reps - Sit to Stand Without Arm Support  - 1 x daily - 4-7 x weekly - 3 sets - 10 reps - Standing Heel Raise with Support  - 1 x daily - 7 x weekly - 3 sets - 10 reps - Hip Extension with Resistance Loop  - 1 x daily - 7 x weekly - 3 sets - 10 reps - Standing Hip Abduction with Resistance at Ankles and Counter Support  - 1 x daily - 7 x weekly - 3 sets - 10 reps - Supine Straight-Leg Raise With Resistance at Ankles  - 1 x daily - 7 x weekly - 3 sets - 10 reps - Sidelying Hip Abduction  - 1 x daily - 7 x weekly - 2 sets - 10 reps - Step Up  - 1 x daily - 7 x weekly - 3 sets - 10 reps - Lateral Step Up  - 1 x daily - 7 x weekly - 3 sets - 10 reps - Recumbent Bike  - 3-5 x weekly - 15-30 min time    ASSESSMENT:  CLINICAL IMPRESSION:  11/05/2023:  Pt has progressed well with therapy.  He has met all goals with the exception of 6 MWT which is about 200 ft from age related norm.  6 MWT did improve ~600 ft compared to eval.  Discussed D/C and benefits of continued strengthening and aerobic activity following therapy.  Pt feels comfortable with D/C.  EVAL- Patient is a 63 y.o. male who was seen today for physical therapy evaluation and treatment for generalized weakness following DKA episode with hospitalization.  Pt also with Right acute medial knee pain and some bil numbness bil since hospitalization. Pt with  balance deficits of BERG 47/56, and functional weakness noted on PF heel raise. Pt with unsteadiness and fatigue and will benefit from skilled PT to address impairment and return to PLOF  with safety and strength to care for autistic dtr weighing 50 #.   OBJECTIVE IMPAIRMENTS: decreased activity tolerance, decreased balance, decreased endurance, decreased knowledge of condition, difficulty walking, decreased ROM, decreased strength, impaired sensation, improper body mechanics, and pain.   ACTIVITY LIMITATIONS: carrying, lifting, standing, squatting, stairs, transfers, locomotion level, and caring  for others  PARTICIPATION LIMITATIONS: cleaning, shopping, community activity, yard work, and caring for autistic dtr  PERSONAL FACTORS: MI (in high school), COPD. DM, Sleep apnea, Asthma are also affecting patient's functional outcome.   REHAB POTENTIAL: Good  CLINICAL DECISION MAKING: Evolving/moderate complexity  EVALUATION COMPLEXITY: Moderate   GOALS: Goals reviewed with patient? Yes  SHORT TERM GOALS: Target date: 10-14-23  Pt will be I with initial HEP Baseline: no knowledge Goal status: MET   2.   Pt will perform 5xSTS in <16 sec in order to demonstrate reduced fall risk and improved functional independence. (MCID of 2.3sec) Baseline: 20.27 sec 10/15/23: 8.2 sec  Goal status: MET   3.  Pt will be educated on fall preparedness Baseline: no knowledge Goal status: MET    LONG TERM GOALS: Target date: 11-04-23  Pt will be I with advanced HEP Baseline: no knowledge 9/5: MET Goal status: MET  2.  Pt will show improved ability to walk for exercise by improving within norms for age Baseline: 1088 ft Norm 1830- 2205 ft 9/5: 1665 Goal status: Partially MET  3.  Pt will be able to shop at [facility] for 30 minutes without fatiguing Baseline: unable to shop due to weakness and fatigue 11/03/23: able  Goal status: MET   4.  Pt will demonstrate ability to dead lift 50 # to simulate caring for 50 lb autistic daughter Baseline: unable to lift presently without unsteadiness 11/03/23: feels more steady on feet, wants more upper body strength Goal status: MET   5.  Pt will be able to improve balance in order to mow lawn on uneven ground with no loss of balance Baseline: unable to mow lawn at present due to weakness and unsteadiness 11/03/23: no issue  Goal status: MET   6.  PSFS score with increase to at least 2 points for average score for MDIC.  revised Baseline: 18/3  (6) 11/03/23: 29/3 Goal status: MET   PLAN:  PT FREQUENCY: 1-2x/week  PT DURATION: 6  weeks  PLANNED INTERVENTIONS: 97164- PT Re-evaluation, 97750- Physical Performance Testing, 97110-Therapeutic exercises, 97530- Therapeutic activity, [id number]- Neuromuscular re-education, 97535- Self Care, 02859- Manual therapy, [id number]- Gait training, [phone]- Electrical stimulation (manual), [phone] (1-2 muscles), 20561 (3+ muscles)- Dry Needling, Patient/Family education, Balance training, Stair training, Taping, Joint mobilization, Spinal mobilization, Cryotherapy, and Moist heat.  PLAN FOR NEXT SESSION:  CK[facility] exercises. Progress balance exercises, gym program  Helene FORBES Gasmen PT 11/05/23 11:24 AM Phone: [phone] Fax: [phone]   For all possible CPT codes, reference the Planned Interventions line above.     Check all conditions that are expected to impact treatment: {Conditions expected to impact treatment:Diabetes mellitus and Musculoskeletal disorders

## 2023-11-08 ENCOUNTER — Ambulatory Visit: Admitting: Dietician

## 2024-03-08 ENCOUNTER — Encounter: Payer: Self-pay | Admitting: Physician Assistant

## 2024-03-19 NOTE — Progress Notes (Unsigned)
 "     Jack Console, PA-C [address] [place], [place]  [address] Phone: [phone]   Gastroenterology Consultation  Referring Provider:     Kahoano, [name], MD Primary Care Physician:  Kahoano, [name], MD Primary Gastroenterologist:  Jack Console, PA-C / *** Reason for Consultation:     Gas pain        HPI:   Discussed the use of AI scribe software for clinical note transcription with the patient, who gave verbal consent to proceed.  New patient.  Here to evaluate gas pain.  History of Present Illness      Past Medical History:  Diagnosis Date   Asthma    COPD (chronic obstructive pulmonary disease) (HCC)    Diabetes mellitus without complication (HCC)    Myocardial infarction (HCC)    1981 at age 64   Pre-diabetes    Sleep apnea    No CPAP    Past Surgical History:  Procedure Laterality Date   no surgeries as of 07/21/19     TOOTH EXTRACTION N/A 08/30/2020   Procedure: DENTAL RESTORATION/EXTRACTIONS;  Surgeon: Sheryle Hamilton, DMD;  Location: [facility] OR;  Service: Oral Surgery;  Laterality: N/A;    Prior to Admission medications  Medication Sig Start Date End Date Taking? Authorizing Provider  albuterol  (PROVENTIL ) (5 MG/ML) 0.5% nebulizer solution Take 0.5 mLs (2.5 mg total) by nebulization every 6 (six) hours as needed for wheezing or shortness of breath. Patient not taking: Reported on 10/05/2023 10/08/17   Couture, Cortni S, PA-C  aspirin  81 MG chewable tablet Chew 1 tablet (81 mg total) by mouth daily. 09/16/23   Singh, Prashant K, MD  Blood Glucose Monitoring Suppl (BLOOD GLUCOSE MONITOR SYSTEM) w/Device KIT Use as directed in the morning, at noon, and at bedtime. May substitute to any manufacturer covered by patient's insurance. 09/16/23   Singh, Prashant K, MD  budesonide -formoterol  (SYMBICORT ) 160-4.5 MCG/ACT inhaler Inhale 2 puffs into the lungs daily. Patient not taking: Reported on 10/05/2023 10/08/17   Couture, Cortni S, PA-C  cetirizine  (ZYRTEC ) 10 MG tablet Take  1 tablet (10 mg total) by mouth daily for 5 days. Patient not taking: Reported on 10/05/2023 04/27/18 09/23/23  Jason Mardy NOVAK, PA-C  doxycycline  (VIBRAMYCIN ) 100 MG capsule Take 1 capsule (100 mg total) by mouth 2 (two) times daily. Patient not taking: Reported on 10/05/2023 09/09/20   Vicky Charleston, PA-C  fenofibrate  (TRICOR ) 48 MG tablet Take 1 tablet (48 mg total) by mouth daily. Patient not taking: Reported on 10/05/2023 09/16/23   Singh, Prashant K, MD  HYDROcodone -acetaminophen  (NORCO) 5-325 MG tablet Take 1 tablet by mouth every 6 (six) hours as needed for moderate pain. Patient not taking: Reported on 10/05/2023 08/30/20   Sheryle Hamilton, DMD  insulin  aspart (NOVOLOG ) 100 UNIT/ML FlexPen Before each meal 3 times a day, 140-199 - 2 units, 200-250 - 4 units, 251-299 - 6 units,  300-349 - 8 units,  350 or above 10 units. 09/16/23   Singh, Prashant K, MD  insulin  glargine (LANTUS  SOLOSTAR) 100 UNIT/ML Solostar Pen Inject 20 Units into the skin daily. 09/16/23   Singh, Prashant K, MD  levofloxacin  (LEVAQUIN ) 750 MG tablet Take 1 tablet (750 mg total) by mouth daily. Patient not taking: Reported on 10/05/2023 09/16/23   Singh, Prashant K, MD  metFORMIN (GLUCOPHAGE) 500 MG tablet Take 500 mg by mouth 2 (two) times daily. Patient not taking: Reported on 10/05/2023 08/02/23   [provider]  pantoprazole  (PROTONIX ) 40 MG tablet  Take 1 tablet (40 mg total) by mouth daily. Patient not taking: Reported on 10/05/2023 09/16/23   Dennise Lavada POUR, MD  pravastatin  (PRAVACHOL ) 40 MG tablet Take 1 tablet (40 mg total) by mouth daily. 09/16/23   Singh, Prashant K, MD  SYMBICORT  160-4.5 MCG/ACT inhaler Inhale 2 puffs into the lungs. Patient not taking: Reported on 10/05/2023 08/31/23   [provider]  Vitamin D, Ergocalciferol, (DRISDOL) 1.25 MG (50000 UNIT) CAPS capsule Take 50,000 Units by mouth once a week. No set day to take it. Patient not taking: Reported on 10/05/2023 08/02/23   [provider]     Family History  Problem Relation Age of Onset   Diabetes Mother    Cancer Father    Colon polyps Neg Hx    Colon cancer Neg Hx    Esophageal cancer Neg Hx    Rectal cancer Neg Hx    Stomach cancer Neg Hx      Social History[1]  Allergies as of 03/22/2024 - Review Complete 11/05/2023  Allergen Reaction Noted   Novocain [procaine] Swelling 04/30/2018   Novocain [procaine] Other (See Comments) 09/02/2023    Review of Systems:    All systems reviewed and negative except where noted in HPI.   Physical Exam:  There were no vitals taken for this visit. No LMP for male patient.  General:   Alert,  Well-developed, well-nourished, pleasant and cooperative in NAD Lungs:  Respirations even and unlabored.  Clear throughout to auscultation.   No wheezes, crackles, or rhonchi. No acute distress. Heart:  Regular rate and rhythm; no murmurs, clicks, rubs, or gallops. Abdomen:  Normal bowel sounds.  No bruits.  Soft, and non-distended without masses, hepatosplenomegaly or hernias noted.  No Tenderness.  No guarding or rebound tenderness.    Neurologic:  Alert and oriented x3;  grossly normal neurologically. Psych:  Alert and cooperative. Normal mood and affect.   Imaging Studies: No results found.  Labs: CBC    Component Value Date/Time   WBC 12.1 (H) 09/16/2023 0555   RBC 2.78 (L) 09/16/2023 0555   HGB 8.4 (L) 09/16/2023 0555   HCT 27.3 (L) 09/16/2023 0555   PLT 193 09/16/2023 0555   MCV 98.2 09/16/2023 0555    CMP     Component Value Date/Time   NA 140 09/16/2023 0539   K 3.5 09/16/2023 0539   CL 105 09/16/2023 0539   CO2 24 09/16/2023 0539   GLUCOSE 189 (H) 09/16/2023 0539   BUN 15 09/16/2023 0539   CREATININE 1.31 (H) 09/16/2023 0539   CALCIUM  8.9 09/16/2023 0539   PROT 7.4 09/03/2023 0704   ALBUMIN 3.3 (L) 09/03/2023 0704   AST 45 (H) 09/03/2023 0704   ALT 38 09/03/2023 0704   ALKPHOS 52 09/03/2023 0704   BILITOT 1.1 09/03/2023 0704   GFRNONAA >60 09/16/2023  0539   GFRAA >60 04/27/2018 1429    Assessment and Plan:   Jack Cunningham is a 64 y.o. y/o male has been referred for ***  Assessment and Plan Assessment & Plan       Follow up ***  Jack Console, PA-C      [1]  Social History Tobacco Use   Smoking status: Never   Smokeless tobacco: Never  Vaping Use   Vaping status: Never Used  Substance Use Topics   Alcohol use: No   Drug use: No   "

## 2024-03-22 ENCOUNTER — Ambulatory Visit: Admitting: Physician Assistant
